# Patient Record
Sex: Female | Born: 1955
Health system: Southern US, Community
[De-identification: ages and names within clinical notes are randomized; demographics above are authoritative.]

## PROBLEM LIST (undated history)

## (undated) DIAGNOSIS — F3181 Bipolar II disorder: Secondary | ICD-10-CM

## (undated) DIAGNOSIS — M51369 Other intervertebral disc degeneration, lumbar region without mention of lumbar back pain or lower extremity pain: Secondary | ICD-10-CM

## (undated) DIAGNOSIS — N393 Stress incontinence (female) (male): Secondary | ICD-10-CM

## (undated) DIAGNOSIS — E7800 Pure hypercholesterolemia, unspecified: Secondary | ICD-10-CM

## (undated) DIAGNOSIS — K469 Unspecified abdominal hernia without obstruction or gangrene: Secondary | ICD-10-CM

## (undated) DIAGNOSIS — IMO0002 Reserved for concepts with insufficient information to code with codable children: Secondary | ICD-10-CM

## (undated) DIAGNOSIS — M5136 Other intervertebral disc degeneration, lumbar region: Secondary | ICD-10-CM

## (undated) DIAGNOSIS — E785 Hyperlipidemia, unspecified: Secondary | ICD-10-CM

## (undated) DIAGNOSIS — E669 Obesity, unspecified: Secondary | ICD-10-CM

## (undated) DIAGNOSIS — G4730 Sleep apnea, unspecified: Secondary | ICD-10-CM

## (undated) DIAGNOSIS — R7301 Impaired fasting glucose: Secondary | ICD-10-CM

## (undated) DIAGNOSIS — I10 Essential (primary) hypertension: Secondary | ICD-10-CM

## (undated) HISTORY — PX: CHOLECYSTECTOMY: SHX55

## (undated) HISTORY — DX: Other intervertebral disc degeneration, lumbar region: M51.36

## (undated) HISTORY — DX: Impaired fasting glucose: R73.01

## (undated) HISTORY — DX: Obesity, unspecified: E66.9

## (undated) HISTORY — DX: Unspecified abdominal hernia without obstruction or gangrene: K46.9

## (undated) HISTORY — DX: Other intervertebral disc degeneration, lumbar region without mention of lumbar back pain or lower extremity pain: M51.369

## (undated) HISTORY — DX: Hyperlipidemia, unspecified: E78.5

## (undated) HISTORY — DX: Essential (primary) hypertension: I10

## (undated) HISTORY — DX: Stress incontinence (female) (male): N39.3

## (undated) HISTORY — PX: ABDOMINAL HYSTERECTOMY: SHX81

## (undated) HISTORY — DX: Bipolar II disorder: F31.81

## (undated) HISTORY — PX: BLADDER REPAIR: SHX76

## (undated) HISTORY — PX: THYROIDECTOMY: SHX17

---

## 1999-07-24 HISTORY — PX: TOTAL ABDOMINAL HYSTERECTOMY W/ BILATERAL SALPINGOOPHORECTOMY: SHX83

## 2007-07-08 ENCOUNTER — Ambulatory Visit: Payer: Self-pay | Admitting: Family Medicine

## 2007-07-08 DIAGNOSIS — I10 Essential (primary) hypertension: Secondary | ICD-10-CM | POA: Insufficient documentation

## 2007-07-08 DIAGNOSIS — M5137 Other intervertebral disc degeneration, lumbosacral region: Secondary | ICD-10-CM | POA: Insufficient documentation

## 2007-07-08 DIAGNOSIS — F319 Bipolar disorder, unspecified: Secondary | ICD-10-CM | POA: Insufficient documentation

## 2007-07-08 DIAGNOSIS — E7800 Pure hypercholesterolemia, unspecified: Secondary | ICD-10-CM | POA: Insufficient documentation

## 2007-07-08 DIAGNOSIS — N393 Stress incontinence (female) (male): Secondary | ICD-10-CM | POA: Insufficient documentation

## 2007-07-21 ENCOUNTER — Ambulatory Visit: Payer: Self-pay | Admitting: Family Medicine

## 2007-07-30 ENCOUNTER — Ambulatory Visit: Payer: Self-pay | Admitting: Obstetrics & Gynecology

## 2007-08-08 ENCOUNTER — Ambulatory Visit: Payer: Self-pay | Admitting: Family Medicine

## 2007-08-14 ENCOUNTER — Encounter: Admission: RE | Admit: 2007-08-14 | Discharge: 2007-10-01 | Payer: Self-pay | Admitting: Family Medicine

## 2007-08-14 ENCOUNTER — Encounter: Payer: Self-pay | Admitting: Family Medicine

## 2007-10-17 ENCOUNTER — Ambulatory Visit: Payer: Self-pay | Admitting: Family Medicine

## 2007-10-20 ENCOUNTER — Encounter: Payer: Self-pay | Admitting: Family Medicine

## 2007-10-28 ENCOUNTER — Telehealth (INDEPENDENT_AMBULATORY_CARE_PROVIDER_SITE_OTHER): Payer: Self-pay | Admitting: *Deleted

## 2007-10-29 ENCOUNTER — Ambulatory Visit (HOSPITAL_COMMUNITY): Payer: Self-pay | Admitting: Psychiatry

## 2007-11-04 ENCOUNTER — Ambulatory Visit (HOSPITAL_COMMUNITY): Payer: Self-pay | Admitting: Psychiatry

## 2007-11-12 ENCOUNTER — Ambulatory Visit (HOSPITAL_COMMUNITY): Payer: Self-pay | Admitting: Psychology

## 2007-11-12 ENCOUNTER — Encounter: Payer: Self-pay | Admitting: Family Medicine

## 2007-11-12 LAB — CONVERTED CEMR LAB
ALT: 26 units/L (ref 0–35)
Albumin: 4.6 g/dL (ref 3.5–5.2)
Calcium: 9.6 mg/dL (ref 8.4–10.5)
Cholesterol: 121 mg/dL (ref 0–200)
Glucose, Bld: 118 mg/dL — ABNORMAL HIGH (ref 70–99)
Potassium: 4.3 meq/L (ref 3.5–5.3)
Total Bilirubin: 0.6 mg/dL (ref 0.3–1.2)
Total Protein: 7.2 g/dL (ref 6.0–8.3)
Triglycerides: 134 mg/dL (ref <150)

## 2007-11-13 ENCOUNTER — Encounter: Payer: Self-pay | Admitting: Family Medicine

## 2007-11-20 ENCOUNTER — Ambulatory Visit (HOSPITAL_COMMUNITY): Payer: Self-pay | Admitting: Psychology

## 2007-11-28 ENCOUNTER — Telehealth: Payer: Self-pay | Admitting: Family Medicine

## 2007-12-02 ENCOUNTER — Ambulatory Visit (HOSPITAL_COMMUNITY): Payer: Self-pay | Admitting: Psychiatry

## 2007-12-10 ENCOUNTER — Ambulatory Visit (HOSPITAL_COMMUNITY): Payer: Self-pay | Admitting: Psychology

## 2007-12-17 ENCOUNTER — Ambulatory Visit (HOSPITAL_COMMUNITY): Payer: Self-pay | Admitting: Psychiatry

## 2007-12-24 ENCOUNTER — Ambulatory Visit (HOSPITAL_COMMUNITY): Payer: Self-pay | Admitting: Psychology

## 2007-12-31 ENCOUNTER — Ambulatory Visit (HOSPITAL_COMMUNITY): Payer: Self-pay | Admitting: Psychology

## 2008-01-02 ENCOUNTER — Ambulatory Visit: Payer: Self-pay | Admitting: Family Medicine

## 2008-01-02 DIAGNOSIS — J209 Acute bronchitis, unspecified: Secondary | ICD-10-CM | POA: Insufficient documentation

## 2008-01-07 ENCOUNTER — Ambulatory Visit (HOSPITAL_COMMUNITY): Payer: Self-pay | Admitting: Psychology

## 2008-01-12 ENCOUNTER — Ambulatory Visit (HOSPITAL_COMMUNITY): Payer: Self-pay | Admitting: Psychiatry

## 2008-01-14 ENCOUNTER — Ambulatory Visit (HOSPITAL_COMMUNITY): Payer: Self-pay | Admitting: Psychology

## 2008-01-20 ENCOUNTER — Ambulatory Visit (HOSPITAL_COMMUNITY): Payer: Self-pay | Admitting: Psychology

## 2008-01-23 ENCOUNTER — Ambulatory Visit: Payer: Self-pay | Admitting: Family Medicine

## 2008-01-29 ENCOUNTER — Ambulatory Visit (HOSPITAL_COMMUNITY): Payer: Self-pay | Admitting: Psychology

## 2008-02-05 ENCOUNTER — Ambulatory Visit (HOSPITAL_COMMUNITY): Payer: Self-pay | Admitting: Psychology

## 2008-02-17 ENCOUNTER — Ambulatory Visit (HOSPITAL_COMMUNITY): Payer: Self-pay | Admitting: Psychology

## 2008-02-24 ENCOUNTER — Ambulatory Visit (HOSPITAL_COMMUNITY): Payer: Self-pay | Admitting: Psychology

## 2008-02-26 ENCOUNTER — Encounter: Admission: RE | Admit: 2008-02-26 | Discharge: 2008-04-13 | Payer: Self-pay | Admitting: Family Medicine

## 2008-03-02 ENCOUNTER — Ambulatory Visit (HOSPITAL_COMMUNITY): Payer: Self-pay | Admitting: Psychology

## 2008-03-10 ENCOUNTER — Ambulatory Visit (HOSPITAL_COMMUNITY): Payer: Self-pay | Admitting: Psychiatry

## 2008-03-23 ENCOUNTER — Ambulatory Visit (HOSPITAL_COMMUNITY): Payer: Self-pay | Admitting: Psychology

## 2008-04-01 ENCOUNTER — Ambulatory Visit: Payer: Self-pay | Admitting: Family Medicine

## 2008-04-01 ENCOUNTER — Encounter: Admission: RE | Admit: 2008-04-01 | Discharge: 2008-04-01 | Payer: Self-pay | Admitting: Family Medicine

## 2008-04-05 ENCOUNTER — Encounter: Admission: RE | Admit: 2008-04-05 | Discharge: 2008-04-05 | Payer: Self-pay | Admitting: Family Medicine

## 2008-04-06 ENCOUNTER — Ambulatory Visit (HOSPITAL_COMMUNITY): Payer: Self-pay | Admitting: Psychology

## 2008-04-08 ENCOUNTER — Telehealth: Payer: Self-pay | Admitting: Family Medicine

## 2008-04-12 ENCOUNTER — Telehealth: Payer: Self-pay | Admitting: Family Medicine

## 2008-05-04 ENCOUNTER — Ambulatory Visit (HOSPITAL_COMMUNITY): Payer: Self-pay | Admitting: Psychology

## 2008-05-06 ENCOUNTER — Ambulatory Visit (HOSPITAL_COMMUNITY): Payer: Self-pay | Admitting: Psychiatry

## 2008-05-07 ENCOUNTER — Encounter: Payer: Self-pay | Admitting: Family Medicine

## 2008-05-11 ENCOUNTER — Telehealth: Payer: Self-pay | Admitting: Family Medicine

## 2008-05-25 ENCOUNTER — Ambulatory Visit (HOSPITAL_COMMUNITY): Payer: Self-pay | Admitting: Psychology

## 2008-06-07 ENCOUNTER — Ambulatory Visit: Payer: Self-pay | Admitting: Family Medicine

## 2008-06-07 DIAGNOSIS — K589 Irritable bowel syndrome without diarrhea: Secondary | ICD-10-CM | POA: Insufficient documentation

## 2008-06-07 DIAGNOSIS — L708 Other acne: Secondary | ICD-10-CM | POA: Insufficient documentation

## 2008-06-22 ENCOUNTER — Encounter: Admission: RE | Admit: 2008-06-22 | Discharge: 2008-06-22 | Payer: Self-pay | Admitting: Family Medicine

## 2008-06-22 ENCOUNTER — Ambulatory Visit: Payer: Self-pay | Admitting: Family Medicine

## 2008-06-22 DIAGNOSIS — R071 Chest pain on breathing: Secondary | ICD-10-CM | POA: Insufficient documentation

## 2008-06-28 ENCOUNTER — Telehealth: Payer: Self-pay | Admitting: Family Medicine

## 2008-06-29 ENCOUNTER — Ambulatory Visit (HOSPITAL_COMMUNITY): Payer: Self-pay | Admitting: Psychiatry

## 2008-06-30 ENCOUNTER — Ambulatory Visit (HOSPITAL_COMMUNITY): Payer: Self-pay | Admitting: Psychology

## 2008-08-12 ENCOUNTER — Encounter: Payer: Self-pay | Admitting: Family Medicine

## 2008-08-16 ENCOUNTER — Ambulatory Visit (HOSPITAL_COMMUNITY): Payer: Self-pay | Admitting: Psychology

## 2008-08-18 ENCOUNTER — Ambulatory Visit: Payer: Self-pay | Admitting: Family Medicine

## 2008-08-18 DIAGNOSIS — F4321 Adjustment disorder with depressed mood: Secondary | ICD-10-CM | POA: Insufficient documentation

## 2008-08-18 DIAGNOSIS — J45901 Unspecified asthma with (acute) exacerbation: Secondary | ICD-10-CM | POA: Insufficient documentation

## 2008-09-02 ENCOUNTER — Ambulatory Visit: Payer: Self-pay | Admitting: Family Medicine

## 2008-09-02 DIAGNOSIS — S139XXA Sprain of joints and ligaments of unspecified parts of neck, initial encounter: Secondary | ICD-10-CM | POA: Insufficient documentation

## 2008-09-03 ENCOUNTER — Ambulatory Visit (HOSPITAL_COMMUNITY): Payer: Self-pay | Admitting: Psychology

## 2008-09-06 ENCOUNTER — Telehealth (INDEPENDENT_AMBULATORY_CARE_PROVIDER_SITE_OTHER): Payer: Self-pay | Admitting: *Deleted

## 2008-09-07 ENCOUNTER — Ambulatory Visit: Payer: Self-pay | Admitting: Family Medicine

## 2008-09-07 DIAGNOSIS — R062 Wheezing: Secondary | ICD-10-CM | POA: Insufficient documentation

## 2008-09-28 ENCOUNTER — Ambulatory Visit (HOSPITAL_COMMUNITY): Payer: Self-pay | Admitting: Psychiatry

## 2008-10-11 ENCOUNTER — Ambulatory Visit (HOSPITAL_COMMUNITY): Payer: Self-pay | Admitting: Psychology

## 2008-12-21 ENCOUNTER — Ambulatory Visit (HOSPITAL_COMMUNITY): Payer: Self-pay | Admitting: Psychiatry

## 2008-12-22 ENCOUNTER — Encounter: Admission: RE | Admit: 2008-12-22 | Discharge: 2008-12-22 | Payer: Self-pay | Admitting: Family Medicine

## 2008-12-22 ENCOUNTER — Ambulatory Visit: Payer: Self-pay | Admitting: Family Medicine

## 2008-12-22 DIAGNOSIS — M79609 Pain in unspecified limb: Secondary | ICD-10-CM | POA: Insufficient documentation

## 2008-12-23 DIAGNOSIS — S92919A Unspecified fracture of unspecified toe(s), initial encounter for closed fracture: Secondary | ICD-10-CM | POA: Insufficient documentation

## 2008-12-27 ENCOUNTER — Ambulatory Visit (HOSPITAL_COMMUNITY): Payer: Self-pay | Admitting: Psychology

## 2009-01-05 ENCOUNTER — Ambulatory Visit: Payer: Self-pay | Admitting: Family Medicine

## 2009-01-07 ENCOUNTER — Ambulatory Visit (HOSPITAL_COMMUNITY): Payer: Self-pay | Admitting: Psychology

## 2009-01-20 ENCOUNTER — Telehealth: Payer: Self-pay | Admitting: Family Medicine

## 2009-01-27 ENCOUNTER — Encounter: Payer: Self-pay | Admitting: Family Medicine

## 2009-01-28 LAB — CONVERTED CEMR LAB
Alkaline Phosphatase: 96 units/L (ref 39–117)
BUN: 15 mg/dL (ref 6–23)
CO2: 23 meq/L (ref 19–32)
Cholesterol: 140 mg/dL (ref 0–200)
Creatinine, Ser: 0.87 mg/dL (ref 0.40–1.20)
Glucose, Bld: 102 mg/dL — ABNORMAL HIGH (ref 70–99)
HCT: 44.2 % (ref 36.0–46.0)
HDL: 53 mg/dL (ref >39)
Hemoglobin: 14.5 g/dL (ref 12.0–15.0)
MCHC: 32.8 g/dL (ref 30.0–36.0)
MCV: 87.9 fL (ref 78.0–100.0)
RBC: 5.03 M/uL (ref 3.87–5.11)
Sodium: 142 meq/L (ref 135–145)
Total Bilirubin: 0.4 mg/dL (ref 0.3–1.2)
Total Protein: 7.3 g/dL (ref 6.0–8.3)
Triglycerides: 112 mg/dL (ref <150)
VLDL: 22 mg/dL (ref 0–40)
WBC: 8.4 10*3/uL (ref 4.0–10.5)

## 2009-02-03 ENCOUNTER — Ambulatory Visit (HOSPITAL_COMMUNITY): Payer: Self-pay | Admitting: Psychology

## 2009-02-24 ENCOUNTER — Ambulatory Visit (HOSPITAL_COMMUNITY): Payer: Self-pay | Admitting: Psychology

## 2009-03-17 ENCOUNTER — Ambulatory Visit (HOSPITAL_COMMUNITY): Payer: Self-pay | Admitting: Psychiatry

## 2009-03-22 ENCOUNTER — Ambulatory Visit (HOSPITAL_COMMUNITY): Payer: Self-pay | Admitting: Psychology

## 2009-04-12 ENCOUNTER — Encounter: Admission: RE | Admit: 2009-04-12 | Discharge: 2009-04-12 | Payer: Self-pay | Admitting: Family Medicine

## 2009-04-12 LAB — HM MAMMOGRAPHY: HM Mammogram: NEGATIVE

## 2009-04-26 ENCOUNTER — Ambulatory Visit: Payer: Self-pay | Admitting: Family Medicine

## 2009-05-05 ENCOUNTER — Ambulatory Visit (HOSPITAL_COMMUNITY): Payer: Self-pay | Admitting: Psychology

## 2009-05-16 ENCOUNTER — Ambulatory Visit: Payer: Self-pay | Admitting: Occupational Medicine

## 2009-06-14 ENCOUNTER — Ambulatory Visit (HOSPITAL_COMMUNITY): Payer: Self-pay | Admitting: Psychiatry

## 2009-06-28 ENCOUNTER — Ambulatory Visit (HOSPITAL_COMMUNITY): Payer: Self-pay | Admitting: Psychology

## 2009-07-08 ENCOUNTER — Telehealth: Payer: Self-pay | Admitting: Family Medicine

## 2009-07-08 DIAGNOSIS — S62639A Displaced fracture of distal phalanx of unspecified finger, initial encounter for closed fracture: Secondary | ICD-10-CM | POA: Insufficient documentation

## 2009-07-08 DIAGNOSIS — E049 Nontoxic goiter, unspecified: Secondary | ICD-10-CM | POA: Insufficient documentation

## 2009-07-12 ENCOUNTER — Encounter: Payer: Self-pay | Admitting: Family Medicine

## 2009-07-14 ENCOUNTER — Encounter: Admission: RE | Admit: 2009-07-14 | Discharge: 2009-07-14 | Payer: Self-pay | Admitting: Family Medicine

## 2009-07-21 ENCOUNTER — Encounter: Payer: Self-pay | Admitting: Family Medicine

## 2009-07-25 ENCOUNTER — Ambulatory Visit (HOSPITAL_COMMUNITY): Payer: Self-pay | Admitting: Psychiatry

## 2009-07-27 ENCOUNTER — Ambulatory Visit (HOSPITAL_COMMUNITY): Payer: Self-pay | Admitting: Psychology

## 2009-08-02 ENCOUNTER — Encounter: Payer: Self-pay | Admitting: Family Medicine

## 2009-08-11 ENCOUNTER — Encounter: Payer: Self-pay | Admitting: Family Medicine

## 2009-08-17 ENCOUNTER — Ambulatory Visit (HOSPITAL_COMMUNITY): Payer: Self-pay | Admitting: Psychology

## 2009-08-31 ENCOUNTER — Ambulatory Visit (HOSPITAL_COMMUNITY): Payer: Self-pay | Admitting: Psychology

## 2009-09-16 ENCOUNTER — Encounter: Payer: Self-pay | Admitting: Family Medicine

## 2009-09-23 ENCOUNTER — Ambulatory Visit (HOSPITAL_COMMUNITY): Payer: Self-pay | Admitting: Psychology

## 2009-10-06 ENCOUNTER — Ambulatory Visit (HOSPITAL_COMMUNITY): Payer: Self-pay | Admitting: Psychology

## 2009-11-04 ENCOUNTER — Ambulatory Visit: Payer: Self-pay | Admitting: Family Medicine

## 2009-11-04 DIAGNOSIS — M542 Cervicalgia: Secondary | ICD-10-CM | POA: Insufficient documentation

## 2009-11-04 DIAGNOSIS — IMO0002 Reserved for concepts with insufficient information to code with codable children: Secondary | ICD-10-CM | POA: Insufficient documentation

## 2009-11-07 ENCOUNTER — Ambulatory Visit (HOSPITAL_COMMUNITY): Payer: Self-pay | Admitting: Psychiatry

## 2009-11-09 ENCOUNTER — Ambulatory Visit: Payer: Self-pay | Admitting: Family Medicine

## 2009-11-09 DIAGNOSIS — L0291 Cutaneous abscess, unspecified: Secondary | ICD-10-CM | POA: Insufficient documentation

## 2009-11-09 DIAGNOSIS — L0390 Cellulitis, unspecified: Secondary | ICD-10-CM

## 2009-11-30 ENCOUNTER — Encounter: Payer: Self-pay | Admitting: Family Medicine

## 2010-02-03 ENCOUNTER — Ambulatory Visit (HOSPITAL_COMMUNITY): Payer: Self-pay | Admitting: Psychiatry

## 2010-03-09 ENCOUNTER — Telehealth (INDEPENDENT_AMBULATORY_CARE_PROVIDER_SITE_OTHER): Payer: Self-pay | Admitting: *Deleted

## 2010-03-22 ENCOUNTER — Ambulatory Visit: Payer: Self-pay | Admitting: Family Medicine

## 2010-03-22 DIAGNOSIS — J309 Allergic rhinitis, unspecified: Secondary | ICD-10-CM | POA: Insufficient documentation

## 2010-03-22 DIAGNOSIS — S838X9A Sprain of other specified parts of unspecified knee, initial encounter: Secondary | ICD-10-CM | POA: Insufficient documentation

## 2010-03-22 DIAGNOSIS — S86819A Strain of other muscle(s) and tendon(s) at lower leg level, unspecified leg, initial encounter: Secondary | ICD-10-CM | POA: Insufficient documentation

## 2010-04-21 ENCOUNTER — Telehealth: Payer: Self-pay | Admitting: Family Medicine

## 2010-05-10 ENCOUNTER — Ambulatory Visit (HOSPITAL_COMMUNITY): Payer: Self-pay | Admitting: Psychiatry

## 2010-05-31 ENCOUNTER — Ambulatory Visit: Payer: Self-pay | Admitting: Family Medicine

## 2010-05-31 DIAGNOSIS — J45909 Unspecified asthma, uncomplicated: Secondary | ICD-10-CM | POA: Insufficient documentation

## 2010-08-02 ENCOUNTER — Ambulatory Visit (HOSPITAL_COMMUNITY)
Admission: RE | Admit: 2010-08-02 | Discharge: 2010-08-02 | Payer: Self-pay | Source: Home / Self Care | Attending: Psychiatry | Admitting: Psychiatry

## 2010-08-06 ENCOUNTER — Ambulatory Visit
Admission: RE | Admit: 2010-08-06 | Discharge: 2010-08-06 | Payer: Self-pay | Source: Home / Self Care | Admitting: Emergency Medicine

## 2010-08-12 ENCOUNTER — Encounter: Payer: Self-pay | Admitting: Family Medicine

## 2010-08-13 ENCOUNTER — Encounter: Payer: Self-pay | Admitting: Family Medicine

## 2010-08-18 ENCOUNTER — Ambulatory Visit
Admission: RE | Admit: 2010-08-18 | Discharge: 2010-08-18 | Payer: Self-pay | Source: Home / Self Care | Attending: Family Medicine | Admitting: Family Medicine

## 2010-08-18 ENCOUNTER — Encounter
Admission: RE | Admit: 2010-08-18 | Discharge: 2010-08-18 | Payer: Self-pay | Source: Home / Self Care | Attending: Family Medicine | Admitting: Family Medicine

## 2010-08-18 ENCOUNTER — Telehealth: Payer: Self-pay | Admitting: Family Medicine

## 2010-08-18 DIAGNOSIS — R42 Dizziness and giddiness: Secondary | ICD-10-CM | POA: Insufficient documentation

## 2010-08-18 DIAGNOSIS — S0003XA Contusion of scalp, initial encounter: Secondary | ICD-10-CM | POA: Insufficient documentation

## 2010-08-18 DIAGNOSIS — S1093XA Contusion of unspecified part of neck, initial encounter: Secondary | ICD-10-CM

## 2010-08-18 DIAGNOSIS — S0083XA Contusion of other part of head, initial encounter: Secondary | ICD-10-CM
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
## 2010-08-22 NOTE — Assessment & Plan Note (Signed)
Summary: bronchitis/ allergies   Vital Signs:  Patient profile:   55 year old female Height:      64.4 inches Weight:      240 pounds BMI:     40.83 O2 Sat:      97 % on Room air Temp:     98.4 degrees F oral Pulse rate:   75 / minute BP sitting:   126 / 76  (left arm) Cuff size:   large  Vitals Entered By: Payton Spark CMA (March 22, 2010 54:03 PM)  O2 Flow:  Room air CC: Productive cough x 5-6 days due to bad allergies. Also c/o L gluteal pain x months.   Primary Care Provider:  Seymour Bars DO  CC:  Productive cough x 5-6 days due to bad allergies. Also c/o L gluteal pain x months..  History of Present Illness: 55 yo WF presents for a cough that began 6 days ago, at times productive.  She has allergies and has some wheezing but no SOB or chest tightness.  She is using sudafed.  She has had post nasal drip and ear fullness but no rhinorrhea.  She has no change in her HAs.  Her cough is not keeping her up at night.    Denies fevers / chills. She is working full time, going back to school and has 4 kids to take care of.  She is going back to to school for [employer].    She is also having pain in the L side of her buttocks.     Current Medications (verified): 1)  Wellbutrin Xl 300 Mg  Tb24 (Bupropion Hcl) .... Take One Tablet By Mouth Once A Day 2)  Lamictal 200 Mg Tabs (Lamotrigine) .... Take 1 Tablet By Mouth Once A Day 3)  Simvastatin 80 Mg  Tabs (Simvastatin) .... Take One Tablet By Mouth Once A Day 4)  Hydrochlorothiazide 25 Mg Tabs (Hydrochlorothiazide) .... Take 1 Tablet By Mouth Once A Day 5)  Econazole Nitrate 1 %  Crea (Econazole Nitrate) .... Apply Daily To Affected Area. 6)  Benazepril Hcl 40 Mg Tabs (Benazepril Hcl) .... Take 1 Tablet By Mouth Once A Day 7)  Amlodipine Besylate 5 Mg Tabs (Amlodipine Besylate) .... Take 1 Tablet By Mouth Once A Day 8)  Tylenol/codeine #3 300-30 Mg Tabs (Acetaminophen-Codeine) .Marland Kitchen.. 1-2 Tabs By Mouth Two Times A Day As  Needed Severe Pain 9)  Cyclobenzaprine Hcl 5 Mg Tabs (Cyclobenzaprine Hcl) .Marland Kitchen.. 1-2 Tabs By Mouth At Bedtime As Needed Neck Pain 10)  Tens Unit .... Use As Directed 11)  Zoloft 50 Mg Tabs (Sertraline Hcl) .... Take 1 Tablet By Mouth Once A Day  Allergies (verified): 1)  ! * Monistat 2)  ! Erythromycin  Review of Systems      See HPI  Physical Exam  General:  alert, well-developed, well-nourished, and well-hydrated.  obese Head:  normocephalic and atraumatic.  sinuses NTTP Eyes:  conjunctiva clear Ears:  EACs patent; TMs translucent and gray with good cone of light and bony landmarks.  Nose:  scant rhinorrhea Mouth:  o/p injected with cobblestoning Neck:  no masses.   Lungs:  Normal respiratory effort, chest expands symmetrically. Lungs are clear to auscultation, no crackles.  dry cough, + bibasilar wheezing Heart:  Normal rate and regular rhythm. S1 and S2 normal without gallop, murmur, click, rub or other extra sounds. Msk:  normal gait and L spine ROM.  tender over insertion of L hamstring muscle onto the ischial tuberosity  Skin:  color normal.   Cervical Nodes:  shotty R>L anterior cervical LNs   Impression & Recommendations:  Problem # 1:  ACUTE BRONCHITIS (ICD-466.0) Likley secondary to acute flare of seasonal allergies vs viral. Treat with ProAir HFA 2 puffs 4 x a day x 7 wk + Prednisone 40 mg/ day x 5 days.  Use Claritin once a day for underlying allergies. Call if not improved in 1 wks. The following medications were removed from the medication list:    Keflex 500 Mg Caps (Cephalexin) .Marland Kitchen... Take 1 tablet by mouth three times a day for 10 days Her updated medication list for this problem includes:    Proair Hfa 108 (90 Base) Mcg/act Aers (Albuterol sulfate) .Marland Kitchen... 2 puffs q 4-6 hrs prn  Problem # 2:  ALLERGIC RHINITIS (ICD-477.9) Start Claritin once a day thru the change of seasons. Her updated medication list for this problem includes:    Claritin 10 Mg Tabs  (Loratadine) .Marland Kitchen... 1 tab by mouth daily  Problem # 3:  MUSCLE STRAIN, HAMSTRING MUSCLE (ICD-844.8) Several mos of hamstring pain, likely muscle strain from prolonged sitting. Will get her set up for PT. Orders: Physical Therapy Referral (PT)  Complete Medication List: 1)  Wellbutrin Xl 300 Mg Tb24 (Bupropion hcl) .... Take one tablet by mouth once a day 2)  Lamictal 200 Mg Tabs (Lamotrigine) .... Take 1 tablet by mouth once a day 3)  Simvastatin 80 Mg Tabs (Simvastatin) .... Take one tablet by mouth once a day 4)  Hydrochlorothiazide 25 Mg Tabs (Hydrochlorothiazide) .... Take 1 tablet by mouth once a day 5)  Econazole Nitrate 1 % Crea (Econazole nitrate) .... Apply daily to affected area. 6)  Benazepril Hcl 40 Mg Tabs (Benazepril hcl) .... Take 1 tablet by mouth once a day 7)  Amlodipine Besylate 5 Mg Tabs (Amlodipine besylate) .... Take 1 tablet by mouth once a day 8)  Tylenol/codeine #3 300-30 Mg Tabs (Acetaminophen-codeine) .Marland Kitchen.. 1-2 tabs by mouth two times a day as needed severe pain 9)  Cyclobenzaprine Hcl 5 Mg Tabs (Cyclobenzaprine hcl) .Marland Kitchen.. 1-2 tabs by mouth at bedtime as needed neck pain 10)  Tens Unit  .... Use as directed 11)  Zoloft 50 Mg Tabs (Sertraline hcl) .... Take 1 tablet by mouth once a day 12)  Prednisone 20 Mg Tabs (Prednisone) .... 2 tabs by mouth qam x 5 days 13)  Proair Hfa 108 (90 Base) Mcg/act Aers (Albuterol sulfate) .... 2 puffs q 4-6 hrs prn 14)  Claritin 10 Mg Tabs (Loratadine) .Marland Kitchen.. 1 tab by mouth daily  Patient Instructions: 1)  For allergic bronchitis: 2)  Take 5 days of Prednisone 40 mg/ day. 3)  Use Inhaler 2 puffs 4 x a day for the next wk. 4)  Start on Claritin once a day for seasonal allergies.  Take daily x 6 wks to prevent recurrence. 5)  Will get you set up for PT down the hall for hamstring strain. Prescriptions: PROAIR HFA 108 (90 BASE) MCG/ACT AERS (ALBUTEROL SULFATE) 2 puffs q 4-6 hrs prn  #1 x 0   Entered and Authorized by:   Seymour Bars  DO   Signed by:   Seymour Bars DO on 03/22/2010   Method used:   Electronically to        [facility]  [id number] (retail)       [address]       [place], [place]  [address]       Ph: [phone]  Fax: [phone]   RxID:   [id number] PREDNISONE 20 MG TABS (PREDNISONE) 2 tabs by mouth qAM x 5 days  #10 x 0   Entered and Authorized by:   Seymour Bars DO   Signed by:   Seymour Bars DO on 03/22/2010   Method used:   Electronically to        [facility]  [id number] (retail)       [address]       [place], [place]  [address]       Ph: [phone]       Fax: [phone]   RxID:   [id number]

## 2010-08-22 NOTE — Op Note (Signed)
Summary: Thyroid Lobectomy/[facility]  Thyroid Lobectomy/[facility]   Imported By: Lanelle Bal 08/24/2009 08:11:24  _____________________________________________________________________  External Attachment:    Type:   Image     Comment:   External Document

## 2010-08-22 NOTE — Letter (Signed)
Summary: [facility]   Imported By: Lanelle Bal 09/27/2009 13:22:59  _____________________________________________________________________  External Attachment:    Type:   Image     Comment:   External Document

## 2010-08-22 NOTE — Letter (Signed)
Summary: [facility]   Imported By: Lanelle Bal 12/08/2009 09:02:00  _____________________________________________________________________  External Attachment:    Type:   Image     Comment:   External Document

## 2010-08-22 NOTE — Assessment & Plan Note (Signed)
Summary: splinters in right arm   Vital Signs:  Patient profile:   55 year old female Height:      64.4 inches Weight:      239 pounds Pulse rate:   94 / minute BP sitting:   147 / 81  (left arm) Cuff size:   large  Vitals Entered By: Kathlene November (November 09, 2009 2:34 PM) CC: got splinters in right lower arm- happened on Monday night   Primary Care Provider:  Seymour Bars DO  CC:  got splinters in right lower arm- happened on Monday night.  History of Present Illness: got splinters in right lower arm- happened on Monday night. door swing back on her arm adn got several splinter. Tried ot remove them with an x-acto knife.  Gets easily infected.  Last Td was in 2006.  No drainage.    Current Medications (verified): 1)  Wellbutrin Xl 300 Mg  Tb24 (Bupropion Hcl) .... Take One Tablet By Mouth Once A Day 2)  Lamictal 200 Mg Tabs (Lamotrigine) .... Take 1 Tablet By Mouth Once A Day 3)  Simvastatin 80 Mg  Tabs (Simvastatin) .... Take One Tablet By Mouth Once A Day 4)  Hydrochlorothiazide 25 Mg Tabs (Hydrochlorothiazide) .... Take 1 Tablet By Mouth Once A Day 5)  Econazole Nitrate 1 %  Crea (Econazole Nitrate) .... Apply Daily To Affected Area. 6)  Benazepril Hcl 40 Mg Tabs (Benazepril Hcl) .... Take 1 Tablet By Mouth Once A Day 7)  Amlodipine Besylate 5 Mg Tabs (Amlodipine Besylate) .... Take 1 Tablet By Mouth Once A Day 8)  Tylenol/codeine #3 300-30 Mg Tabs (Acetaminophen-Codeine) .Marland Kitchen.. 1-2 Tabs By Mouth Two Times A Day As Needed Severe Pain 9)  Cyclobenzaprine Hcl 5 Mg Tabs (Cyclobenzaprine Hcl) .Marland Kitchen.. 1-2 Tabs By Mouth At Bedtime As Needed Neck Pain 10)  Tens Unit .... Use As Directed 11)  Zoloft 50 Mg Tabs (Sertraline Hcl) .... Take 1 Tablet By Mouth Once A Day 12)  Piroxicam 20 Mg Caps (Piroxicam) .Marland Kitchen.. 1 Capsule By Mouth Daily With Food As Needed For Pain  Allergies (verified): 1)  ! * Monistat 2)  ! Erythromycin  Comments:  Nurse/Medical Assistant: The patient's medications and  allergies were reviewed with the patient and were updated in the Medication and Allergy Lists. Kathlene November (November 09, 2009 2:35 PM)  Physical Exam  Skin:  Right forearm with multiple laceration with some surrounding erythema.  No active drainage or lesions.    Impression & Recommendations:  Problem # 1:  CELLULITIS (ICD-682.9) Some early cellulitis. Will tx wiht keflex. Call if not improving.  Td is up to date.  Her updated medication list for this problem includes:    Keflex 500 Mg Caps (Cephalexin) .Marland Kitchen... Take 1 tablet by mouth three times a day for 10 days  Complete Medication List: 1)  Wellbutrin Xl 300 Mg Tb24 (Bupropion hcl) .... Take one tablet by mouth once a day 2)  Lamictal 200 Mg Tabs (Lamotrigine) .... Take 1 tablet by mouth once a day 3)  Simvastatin 80 Mg Tabs (Simvastatin) .... Take one tablet by mouth once a day 4)  Hydrochlorothiazide 25 Mg Tabs (Hydrochlorothiazide) .... Take 1 tablet by mouth once a day 5)  Econazole Nitrate 1 % Crea (Econazole nitrate) .... Apply daily to affected area. 6)  Benazepril Hcl 40 Mg Tabs (Benazepril hcl) .... Take 1 tablet by mouth once a day 7)  Amlodipine Besylate 5 Mg Tabs (Amlodipine besylate) .... Take 1 tablet by mouth  once a day 8)  Tylenol/codeine #3 300-30 Mg Tabs (Acetaminophen-codeine) .Marland Kitchen.. 1-2 tabs by mouth two times a day as needed severe pain 9)  Cyclobenzaprine Hcl 5 Mg Tabs (Cyclobenzaprine hcl) .Marland Kitchen.. 1-2 tabs by mouth at bedtime as needed neck pain 10)  Tens Unit  .... Use as directed 11)  Zoloft 50 Mg Tabs (Sertraline hcl) .... Take 1 tablet by mouth once a day 12)  Piroxicam 20 Mg Caps (Piroxicam) .Marland Kitchen.. 1 capsule by mouth daily with food as needed for pain 13)  Keflex 500 Mg Caps (Cephalexin) .... Take 1 tablet by mouth three times a day for 10 days Prescriptions: KEFLEX 500 MG CAPS (CEPHALEXIN) Take 1 tablet by mouth three times a day for 10 days  #30 x 0   Entered and Authorized by:   Nani Gasser MD   Signed by:    Nani Gasser MD on 11/09/2009   Method used:   Electronically to        [facility]  [id number] (retail)       [address]       [place], [place]  [address]       Ph: [phone]       Fax: [phone]   RxID:   [id number]

## 2010-08-22 NOTE — Letter (Signed)
Summary: [facility]  [facility]   Imported By: Lanelle Bal 08/29/2009 10:19:52  _____________________________________________________________________  External Attachment:    Type:   Image     Comment:   External Document

## 2010-08-22 NOTE — Op Note (Signed)
Summary: Thyroid Lobectomy /[facility]  Thyroid Lobectomy [facility]   Imported By: Lanelle Bal 09/07/2009 10:18:55  _____________________________________________________________________  External Attachment:    Type:   Image     Comment:   External Document

## 2010-08-22 NOTE — Assessment & Plan Note (Signed)
Summary: allergic bronchitis   Vital Signs:  Patient profile:   55 year old female Height:      64.4 inches Weight:      243 pounds BMI:     41.34 O2 Sat:      98 % on Room air Temp:     97.9 degrees F oral Pulse rate:   69 / minute BP sitting:   145 / 63  (left arm) Cuff size:   large  Vitals Entered By: Payton Spark CMA (May 31, 2010 1:24 PM)  O2 Flow:  Room air  Serial Vital Signs/Assessments:  Comments: 1:52 PM Peak [facility] [address] By: Payton Spark CMA   CC: Productive cough, hoarse and congestion x 1 week.   Primary Care Provider:  Seymour Bars DO  CC:  Productive cough and hoarse and congestion x 1 week.Marland Kitchen  History of Present Illness: 55 yo WF presents for hoarseness after spraying for fleas a week ago.  She had coughed up a pill that she swallowed wrong the same day.  That's when her sore throat and laryngitis started.  She has also had nasal congestion.  No fevers or chills.  No bodyaches.  She has a productive cough.  Her cough is a little worse at night.  She has some wheezing and off but no SOB or chest tightness.    She is taking Nyquil, allergy medicine ( Mucinex DM). She has never really had asthma but has required and inhaler with bronchitis.  Current Medications (verified): 1)  Wellbutrin Xl 300 Mg  Tb24 (Bupropion Hcl) .... Take One Tablet By Mouth Once A Day 2)  Lamictal 200 Mg Tabs (Lamotrigine) .... Take 1 Tablet By Mouth Once A Day 3)  Simvastatin 80 Mg  Tabs (Simvastatin) .... Take One Tablet By Mouth Once A Day 4)  Hydrochlorothiazide 25 Mg Tabs (Hydrochlorothiazide) .... Take 1 Tablet By Mouth Once A Day 5)  Econazole Nitrate 1 %  Crea (Econazole Nitrate) .... Apply Daily To Affected Area. 6)  Benazepril Hcl 40 Mg Tabs (Benazepril Hcl) .... Take 1 Tablet By Mouth Once A Day 7)  Amlodipine Besylate 5 Mg Tabs (Amlodipine Besylate) .... Take 1 Tablet By Mouth Once A Day 8)  Tylenol/codeine #3 300-30 Mg Tabs (Acetaminophen-Codeine) .Marland Kitchen..  1-2 Tabs By Mouth Two Times A Day As Needed Severe Pain 9)  Cyclobenzaprine Hcl 5 Mg Tabs (Cyclobenzaprine Hcl) .Marland Kitchen.. 1-2 Tabs By Mouth At Bedtime As Needed Neck Pain 10)  Tens Unit .... Use As Directed 11)  Zoloft 50 Mg Tabs (Sertraline Hcl) .... Take 1 Tablet By Mouth Once A Day  Allergies (verified): 1)  ! * Monistat 2)  ! Erythromycin  Past History:  Past Medical History: Reviewed history from 06/07/2008 and no changes required. impaired fasting glucose/ hx GDM HTN hyperlipidemia bipolar type II (pscyh hospitalization 2006) incest survivor stress incontinence obesity Lumbar DDD- uses TENS unit abdominal incisional hernia  last mammo Spring 2008 pap 2009- Dr Marice Potter  Social History: Reviewed history from 07/08/2007 and no changes required. Used to be a Engineer, civil (consulting) and run businesses, has been a Development worker, international aid. Married, been thru separation.  Divorced x 1. Has son in [place], grown and daughter, Idalia Needle at home (24) Adoped several of her foster kids: Aggie Cosier and Vernona Rieger (sisters), Gaynelle Adu. Quit smoking in 1996. 6 caffeine/ day. Joined the [employer].  Hopes to lose weight.  Review of Systems      See HPI  Physical Exam  General:  alert, well-developed, well-nourished,  and well-hydrated.  obese Head:  normocephalic and atraumatic.   Eyes:  conjunctiva clear Nose:  no nasal discharge.   Mouth:  pharynx pink and moist.  hoarse.  no injection Neck:  no masses.   Lungs:  Normal respiratory effort, chest expands symmetrically. Lungs are clear to auscultation, no crackles with end expiratory wheeze Heart:  Normal rate and regular rhythm. S1 and S2 normal without gallop, murmur, click, rub or other extra sounds. Skin:  color normal and no rashes.   Cervical Nodes:  No lymphadenopathy noted   Impression & Recommendations:  Problem # 1:  BRONCHITIS, ALLERGIC (ICD-493.90)  Will treat allergic bronchitis with use of D - Allergy chewables (samples given) 1-2 tabs by mouth qid as needed.  Once samples  complete, change to OTC Zyrtec in the evenings.  Use Xopenex HFA 2 puffs 4 x a day x 10 days.  Call if not improved in 10 days. The following medications were removed from the medication list:    Prednisone 20 Mg Tabs (Prednisone) .Marland Kitchen... 2 tabs by mouth qam x 5 days    Proair Hfa 108 (90 Base) Mcg/act Aers (Albuterol sulfate) .Marland Kitchen... 2 puffs q 4-6 hrs prn  Orders: Peak [facility] (94150)  Complete Medication List: 1)  Wellbutrin Xl 300 Mg Tb24 (Bupropion hcl) .... Take one tablet by mouth once a day 2)  Lamictal 200 Mg Tabs (Lamotrigine) .... Take 1 tablet by mouth once a day 3)  Simvastatin 80 Mg Tabs (Simvastatin) .... Take one tablet by mouth once a day 4)  Hydrochlorothiazide 25 Mg Tabs (Hydrochlorothiazide) .... Take 1 tablet by mouth once a day 5)  Econazole Nitrate 1 % Crea (Econazole nitrate) .... Apply daily to affected area. 6)  Benazepril Hcl 40 Mg Tabs (Benazepril hcl) .... Take 1 tablet by mouth once a day 7)  Amlodipine Besylate 5 Mg Tabs (Amlodipine besylate) .... Take 1 tablet by mouth once a day 8)  Tylenol/codeine #3 300-30 Mg Tabs (Acetaminophen-codeine) .Marland Kitchen.. 1-2 tabs by mouth two times a day as needed severe pain 9)  Cyclobenzaprine Hcl 5 Mg Tabs (Cyclobenzaprine hcl) .Marland Kitchen.. 1-2 tabs by mouth at bedtime as needed neck pain 10)  Tens Unit  .... Use as directed 11)  Zoloft 50 Mg Tabs (Sertraline hcl) .... Take 1 tablet by mouth once a day  Patient Instructions: 1)  Use D - Allergy tabs 1-2 chewable tabs every 6 hrs as needed for cough and congestion.  Once complete, change to OTC Zyrtec in the evenings if allergy symptoms continue. 2)  Use Xopenex inhaler 2 puffs 4 x a day for the next 10 days for wheezing. 3)  If not resolved in 10 days, please call.   Orders Added: 1)  Est. Patient Level III [26948] 2)  Peak Flow Rate [94150]

## 2010-08-22 NOTE — Progress Notes (Signed)
Summary: Wants pain med refilled  Phone Note Call from Patient Call back at Home Phone [phone]   Caller: Patient Call For: Seymour Bars DO Summary of Call: Pt calls and LM that she fell and hurts in her back and wants a refill on the Tylenol #3 Initial call taken by: Kathlene November,  March 09, 2010 2:48 PM  Follow-up for Phone Call        Since this is a narcotic and not routinely on her med list, will need OV. Follow-up by: Seymour Bars DO,  March 10, 2010 8:06 AM  Additional Follow-up for Phone Call Additional follow up Details #1::        [facility] informing Pt of the above Additional Follow-up by: Payton Spark CMA,  March 10, 2010 8:36 AM     Appended Document: Wants pain med refilled Prescriptions: TYLENOL/CODEINE #3 300-30 MG TABS (ACETAMINOPHEN-CODEINE) 1-2 tabs by mouth two times a day as needed severe pain  #24 x 0   Entered and Authorized by:   Seymour Bars DO   Signed by:   Seymour Bars DO on 03/10/2010   Method used:   Printed then faxed to ...       [facility]  [address] [phone]* (retail)       [address]       [place], [place]  [address]       Ph: [phone]       Fax: [phone]   RxID:   [id number]  Will fill #24.  If needing more for her injury, will need an OV.  I did see 2 prior fills for this medicine, the last one in June 2010.  Seymour Bars, D.O.  Appended Document: Wants pain med refilled Pt aware of the above

## 2010-08-22 NOTE — Consult Note (Signed)
Summary: [facility]   Imported By: Lanelle Bal 07/29/2009 09:56:23  _____________________________________________________________________  External Attachment:    Type:   Image     Comment:   External Document

## 2010-08-22 NOTE — Letter (Signed)
Summary: [facility]   Imported By: Lanelle Bal 09/02/2009 13:00:01  _____________________________________________________________________  External Attachment:    Type:   Image     Comment:   External Document

## 2010-08-22 NOTE — Progress Notes (Signed)
Summary: Wants diflucan  Phone Note Call from Patient Call back at Home Phone [phone]   Caller: Patient Call For: Sharon Bars DO Summary of Call: Pt calls and wants to know if you would call in Diflucan for her- she has a yeast infection and states she is allergic to Monistat and OTC meds. Leaving to go out of town today at 2 for a wedding Initial call taken by: Kathlene November LPN,  April 21, 2010 9:12 AM    New/Updated Medications: DIFLUCAN 150 MG TABS (FLUCONAZOLE) 1 tab by mouth x 1 Prescriptions: DIFLUCAN 150 MG TABS (FLUCONAZOLE) 1 tab by mouth x 1  #1 tab x 0   Entered and Authorized by:   Sharon Bars DO   Signed by:   Sharon Bars DO on 04/21/2010   Method used:   Electronically to        [facility]  [id number] (retail)       [address]       [place], [place]  [address]       Ph: [phone]       Fax: [phone]   RxID:   [id number]

## 2010-08-24 NOTE — Progress Notes (Signed)
Summary: Lab Orders  Phone Note Call from Patient Call back at Home Phone [phone]   Caller: Patient Summary of Call: Pt will be going to the lab for bloodwork 08/22/10, pls send paperwork to lab Initial call taken by: Lannette Donath,  August 18, 2010 4:07 PM  New Problems: DIZZINESS (ICD-780.4)   New Problems: DIZZINESS (ICD-780.4)  Appended Document: Lab Orders lab order printed.  Seymour Bars, D.O.

## 2010-08-24 NOTE — Assessment & Plan Note (Signed)
Summary: COUGHING,DIZZINESS,SINUS PRBLEMS,FEVER/WSE   Vital Signs:  Patient Profile:   55 Years Old Female CC:      productive cough, HA, congestion x 5 days Height:     64.4 inches Weight:      247 pounds O2 Sat:      98 % O2 treatment:    Room Air Temp:     98.8 degrees F oral Pulse rate:   80 / minute Resp:     18 per minute BP sitting:   156 / 95  (left arm) Cuff size:   large  Vitals Entered By: Lajean Saver RN (August 06, 2010 11:58 AM)                  Updated Prior Medication List: WELLBUTRIN XL 300 MG  TB24 (BUPROPION HCL) Take one tablet by mouth once a day LAMICTAL 200 MG TABS (LAMOTRIGINE) Take 1 tablet by mouth once a day SIMVASTATIN 80 MG  TABS (SIMVASTATIN) Take one tablet by mouth once a day HYDROCHLOROTHIAZIDE 25 MG TABS (HYDROCHLOROTHIAZIDE) Take 1 tablet by mouth once a day ECONAZOLE NITRATE 1 %  CREA (ECONAZOLE NITRATE) Apply daily to affected area. BENAZEPRIL HCL 40 MG TABS (BENAZEPRIL HCL) Take 1 tablet by mouth once a day AMLODIPINE BESYLATE 5 MG TABS (AMLODIPINE BESYLATE) Take 1 tablet by mouth once a day TYLENOL/CODEINE #3 300-30 MG TABS (ACETAMINOPHEN-CODEINE) 1-2 tabs by mouth two times a day as needed severe pain CYCLOBENZAPRINE HCL 5 MG TABS (CYCLOBENZAPRINE HCL) 1-2 tabs by mouth at bedtime as needed neck pain * TENS UNIT use as directed ZOLOFT 50 MG TABS (SERTRALINE HCL) Take 1 tablet by mouth once a day  Current Allergies (reviewed today): ! * MONISTAT ! ERYTHROMYCINHistory of Present Illness History from: patient Chief Complaint: productive cough, HA, congestion x 5 days History of Present Illness: 55 Years Old Female complains of onset of cold symptoms for 5 days.  Jazia has been using Tylenol Cold & Sinus which is helping a little bit. No sore throat + cough No pleuritic pain No wheezing + nasal congestion + post-nasal drainage + sinus pain/pressure + chest congestion No itchy/red eyes No earache No hemoptysis No SOB +  chills/sweats No fever No nausea No vomiting No abdominal pain No diarrhea No skin rashes No fatigue No myalgias No headache   REVIEW OF SYSTEMS Constitutional Symptoms      Denies fever, chills, night sweats, weight loss, weight gain, and fatigue.  Eyes       Denies change in vision, eye pain, eye discharge, glasses, contact lenses, and eye surgery. Ear/Nose/Throat/Mouth       Complains of sinus problems.      Denies hearing loss/aids, change in hearing, ear pain, ear discharge, dizziness, frequent runny nose, frequent nose bleeds, sore throat, hoarseness, and tooth pain or bleeding.      Comments: congestion Respiratory       Complains of productive cough.      Denies dry cough, wheezing, shortness of breath, asthma, bronchitis, and emphysema/COPD.  Cardiovascular       Denies murmurs, chest pain, and tires easily with exhertion.      Comments: chest burning with cough   Gastrointestinal       Denies stomach pain, nausea/vomiting, diarrhea, constipation, blood in bowel movements, and indigestion. Genitourniary       Denies painful urination, kidney stones, and loss of urinary control. Neurological       Complains of headaches.      Denies paralysis, seizures,  and fainting/blackouts. Musculoskeletal       Denies muscle pain, joint pain, joint stiffness, decreased range of motion, redness, swelling, muscle weakness, and gout.  Skin       Denies bruising, unusual mles/lumps or sores, and hair/skin or nail changes.  Psych       Denies mood changes, temper/anger issues, anxiety/stress, speech problems, depression, and sleep problems. Other Comments: Patient also c/o nasal pain to the touch. She c/o some dizziness. She fell and hit her head about 1 week ago. CT was done and cleared   Past History:  Past Medical History: Reviewed history from 06/07/2008 and no changes required. impaired fasting glucose/ hx GDM HTN hyperlipidemia bipolar type II (pscyh hospitalization  2006) incest survivor stress incontinence obesity Lumbar DDD- uses TENS unit abdominal incisional hernia  last mammo Spring 2008 pap 2009- Dr Marice Potter  Past Surgical History: TAH with BSO 2001 for an ovarian cyst Cholecystectomy Hysterectomy bladder repair Thyroidectomy  Family History: Reviewed history from 07/08/2007 and no changes required. father died of AMI at 70, high chol, HTN mother breast cancer at 63 Grandmother breast cancer in 67's sister depression  Social History: Reviewed history from 07/08/2007 and no changes required. Used to be a Engineer, civil (consulting) and run businesses, has been a Development worker, international aid. Married, been thru separation.  Divorced x 1. Has son in [place], grown and daughter, Idalia Needle at home (16) Adoped several of her foster kids: Aggie Cosier and Vernona Rieger (sisters), Gaynelle Adu. Quit smoking in 1996. 6 caffeine/ day. Joined the [employer].  Hopes to lose weight. Physical Exam General appearance: well developed, well nourished, no acute distress Ears: normal, no lesions or deformities Nasal: clear discharge Oral/Pharynx: clear PND, no exudates, patent Neck: neck supple,  trachea midline, no masses Chest/Lungs: no rales, wheeze bilateral, breath sounds equal without effort, mild scattered rhonchi Heart: regular rate and  rhythm, no murmur Skin: no obvious rashes or lesions MSE: oriented to time, place, and person Assessment New Problems: BRONCHITIS, ACUTE (ICD-466.0)   Patient Education: Patient and/or caregiver instructed in the following: rest, fluids.  Plan New Medications/Changes: ZITHROMAX Z-PAK 250 MG TABS (AZITHROMYCIN) use as directed  #1 x 0, 08/06/2010, Hoyt Koch MD CHERATUSSIN AC 100-10 MG/5ML SYRP (GUAIFENESIN-CODEINE) 5cc q4-6 hrs as needed cough  #6oz x 0, 08/06/2010, Hoyt Koch MD PREDNISONE (PAK) 10 MG TABS (PREDNISONE) 6 day pack, use as directed  #1 x 0, 08/06/2010, Hoyt Koch MD  New Orders: Est. Patient Level III [11914] Pulse Oximetry (single  measurment) [94760] Planning Comments:   1)  Take the prescribed antibiotic as instructed. 2)  Use nasal saline solution (over the counter) at least 3 times a day. 3)  Use over the counter decongestants like Zyrtec-D every 12 hours as needed to help with congestion.  Caution if High blood pressure. 4)  Can take tylenol every 6 hours or motrin every 8 hours for pain or fever. 5)  Follow up with your primary doctor  if no improvement in 5-7 days, sooner if increasing pain, fever, or new symptoms.   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    The patient and/or caregiver has been counseled thoroughly with regard to medications prescribed including dosage, schedule, interactions, rationale for use, and possible side effects and they verbalize understanding.  Diagnoses and expected course of recovery discussed and will return if not improved as expected or if the condition worsens. Patient and/or caregiver verbalized understanding.  Prescriptions: ZITHROMAX Z-PAK 250 MG TABS (AZITHROMYCIN) use as directed  #1 x 0   Entered and Authorized by:   Hoyt Koch MD   Signed by:   Hoyt Koch MD on 08/06/2010   Method used:   Print then Give to Patient   RxID:   [id number] CHERATUSSIN AC 100-10 MG/5ML SYRP (GUAIFENESIN-CODEINE) 5cc q4-6 hrs as needed cough  #6oz x 0   Entered and Authorized by:   Hoyt Koch MD   Signed by:   Hoyt Koch MD on 08/06/2010   Method used:   Print then Give to Patient   RxID:   [id number] PREDNISONE (PAK) 10 MG TABS (PREDNISONE) 6 day pack, use as directed  #1 x 0   Entered and Authorized by:   Hoyt Koch MD   Signed by:   Hoyt Koch MD on 08/06/2010   Method used:   Print then Give to Patient   RxID:   [id number]   Orders Added: 1)  Est. Patient Level III [53664] 2)  Pulse Oximetry (single measurment) [40347]

## 2010-08-30 NOTE — Assessment & Plan Note (Signed)
Summary: DIZZNESS   Vital Signs:  Patient profile:   55 year old female Height:      64.4 inches Weight:      248 pounds BMI:     42.19 O2 Sat:      98 % on Room air Pulse rate:   86 / minute BP sitting:   151 / 83  (left arm) Cuff size:   large  Vitals Entered By: Payton Spark CMA (August 18, 2010 3:10 PM)  O2 Flow:  Room air CC: Dizzy spells since falling and hitting head a few weeks ago. Normal workup at ED after fall.    Primary Care Provider:  Seymour Bars DO  CC:  Dizzy spells since falling and hitting head a few weeks ago. Normal workup at ED after fall. Marland Kitchen  History of Present Illness: 55 yo WF presents for head contusion that occured 2 wks ago.  She was stepping backwards off a curb taking pictures.  She hit her head on cement and did not brace her fall since she was going backwards.  Denies any LOC, confusion, seizure, vomitting or vision change.  EMS came.  She did not have a laceration.  She had a CT scan that was normal.  She was released that day.  She was given Tylenol 3.  She still has some tenderness to touch and a knot in the occiput but denies daily HAs.  She is having dizzy spells on and off which started PRIOR to her fall.  Denies any memory loss or nausea since this occurence.  Says she is taking all of her meds.  Denies any chest pain, palpitations, edema or SOB.    Current Medications (verified): 1)  Wellbutrin Xl 300 Mg  Tb24 (Bupropion Hcl) .... Take One Tablet By Mouth Once A Day 2)  Lamictal 200 Mg Tabs (Lamotrigine) .... Take 1 Tablet By Mouth Once A Day 3)  Simvastatin 80 Mg  Tabs (Simvastatin) .... Take One Tablet By Mouth Once A Day 4)  Hydrochlorothiazide 25 Mg Tabs (Hydrochlorothiazide) .... Take 1 Tablet By Mouth Once A Day 5)  Econazole Nitrate 1 %  Crea (Econazole Nitrate) .... Apply Daily To Affected Area. 6)  Benazepril Hcl 40 Mg Tabs (Benazepril Hcl) .... Take 1 Tablet By Mouth Once A Day 7)  Amlodipine Besylate 5 Mg Tabs (Amlodipine Besylate)  .... Take 1 Tablet By Mouth Once A Day 8)  Tylenol/codeine #3 300-30 Mg Tabs (Acetaminophen-Codeine) .Marland Kitchen.. 1-2 Tabs By Mouth Two Times A Day As Needed Severe Pain 9)  Cyclobenzaprine Hcl 5 Mg Tabs (Cyclobenzaprine Hcl) .Marland Kitchen.. 1-2 Tabs By Mouth At Bedtime As Needed Neck Pain 10)  Tens Unit .... Use As Directed 11)  Zoloft 50 Mg Tabs (Sertraline Hcl) .... Take 1 Tablet By Mouth Once A Day  Allergies (verified): 1)  ! * Monistat 2)  ! Erythromycin  Past History:  Past Medical History: Reviewed history from 06/07/2008 and no changes required. impaired fasting glucose/ hx GDM HTN hyperlipidemia bipolar type II (pscyh hospitalization 2006) incest survivor stress incontinence obesity Lumbar DDD- uses TENS unit abdominal incisional hernia  last mammo Spring 2008 pap 2009- Dr Marice Potter  Social History: Reviewed history from 07/08/2007 and no changes required. Used to be a Engineer, civil (consulting) and run businesses, has been a Development worker, international aid. Married, been thru separation.  Divorced x 1. Has son in [place], grown and daughter, Idalia Needle at home (8) Adoped several of her foster kids: Aggie Cosier and Vernona Rieger (sisters), Gaynelle Adu. Quit smoking in 1996. 6 caffeine/  day. Joined the [employer].  Hopes to lose weight.  Review of Systems      See HPI  Physical Exam  General:  alert, well-developed, well-nourished, and well-hydrated.  obese Head:  normocephalic and atraumatic.   Eyes:  pupils equal, pupils round, and pupils reactive to light.  no nystagmus Ears:  EACs patent; TMs translucent and gray with good cone of light and bony landmarks.  Nose:  no nasal discharge.   Mouth:  pharynx pink and moist.   Neck:  supple, full ROM, and no masses.   Lungs:  Normal respiratory effort, chest expands symmetrically. Lungs are clear to auscultation, no crackles or wheezes. Heart:  Normal rate and regular rhythm. S1 and S2 normal without gallop, murmur, click, rub or other extra sounds. Pulses:  2+ radial pulses Neurologic:  cranial nerves  II-XII intact.  grip + 5/5, no tremor.  no pass pointing Skin:  no pallor Psych:  good eye contact, not anxious appearing, and not depressed appearing.     Impression & Recommendations:  Problem # 1:  DIZZINESS (ICD-780.4) Dizziness (not true rotational vertigo) x several wks which started before her head contusion. Exam is normal today.  BP is high and I was not able to elicit dizziness.  She did not get lightheaded when rising from seated position quickly. Will work on improving her BP control and will check some labs.    If not improving, will get her in with neuro.  Problem # 2:  CONTUSION, HEAD (ICD-920) Dizziness started prior to her head contusion apparently but given the trauma she did sustain, will repeat CT head today --> normal. Cotninue supportive care.  She does NOT fall into the post concussive syndrome. Orders: T-CT Head w/o cm ([id number]) Ophthalmology Referral (Ophthalmology)  Problem # 3:  ESSENTIAL HYPERTENSION, BENIGN (ICD-401.1) Not well controlled.  Will increase Norvasc from 5--> 10 mg once daily.  REcheck in 2 wks. Her updated medication list for this problem includes:    Hydrochlorothiazide 25 Mg Tabs (Hydrochlorothiazide) .Marland Kitchen... Take 1 tablet by mouth once a day    Benazepril Hcl 40 Mg Tabs (Benazepril hcl) .Marland Kitchen... Take 1 tablet by mouth once a day    Norvasc 10 Mg Tabs (Amlodipine besylate) .Marland Kitchen... 1 tab by mouth daily  BP today: 151/83 Prior BP: 156/95 (08/06/2010)  Labs Reviewed: K+: 4.2 (01/27/2009) Creat: : 0.87 (01/27/2009)   Chol: 140 (01/27/2009)   HDL: 53 (01/27/2009)   LDL: 65 (01/27/2009)   TG: 112 (01/27/2009)  Complete Medication List: 1)  Wellbutrin Xl 300 Mg Tb24 (Bupropion hcl) .... Take one tablet by mouth once a day 2)  Lamictal 200 Mg Tabs (Lamotrigine) .... Take 1 tablet by mouth once a day 3)  Simvastatin 80 Mg Tabs (Simvastatin) .... 1/2 tab by mouth at bedtime 4)  Hydrochlorothiazide 25 Mg Tabs (Hydrochlorothiazide) .... Take 1 tablet by  mouth once a day 5)  Econazole Nitrate 1 % Crea (Econazole nitrate) .... Apply daily to affected area. 6)  Benazepril Hcl 40 Mg Tabs (Benazepril hcl) .... Take 1 tablet by mouth once a day 7)  Norvasc 10 Mg Tabs (Amlodipine besylate) .Marland Kitchen.. 1 tab by mouth daily 8)  Tylenol/codeine #3 300-30 Mg Tabs (Acetaminophen-codeine) .Marland Kitchen.. 1-2 tabs by mouth two times a day as needed severe pain 9)  Cyclobenzaprine Hcl 5 Mg Tabs (Cyclobenzaprine hcl) .Marland Kitchen.. 1-2 tabs by mouth at bedtime as needed neck pain 10)  Tens Unit  .... Use as directed 11)  Zoloft 50 Mg Tabs (Sertraline  hcl) .... Take 1 tablet by mouth once a day  Patient Instructions: 1)  Increase Amlodopine to 10 mg once daily. 2)  Cut simvastatin in half to 40 mg at bedtime. 3)  CT head today. 4)  Will call you w/ results tomorrow. 5)  Will set you up for an eye exam. 6)  REturn for f/u in 2 wks. Prescriptions: NORVASC 10 MG TABS (AMLODIPINE BESYLATE) 1 tab by mouth daily  #30 x 3   Entered and Authorized by:   Seymour Bars DO   Signed by:   Seymour Bars DO on 08/18/2010   Method used:   Electronically to        [facility]  [id number] (retail)       [address]       [place], [place]  [address]       Ph: [phone]       Fax: [phone]   RxID:   [id number]    Orders Added: 1)  T-CT Head w/o cm [70450] 2)  Ophthalmology Referral [Ophthalmology] 3)  Est. Patient Level IV [44034]

## 2010-08-31 ENCOUNTER — Telehealth: Payer: Self-pay | Admitting: Family Medicine

## 2010-08-31 ENCOUNTER — Encounter: Payer: Self-pay | Admitting: Family Medicine

## 2010-08-31 DIAGNOSIS — I10 Essential (primary) hypertension: Secondary | ICD-10-CM

## 2010-09-01 LAB — CONVERTED CEMR LAB
ALT: 26 units/L (ref 0–35)
AST: 20 units/L (ref 0–37)
Basophils Absolute: 0 10*3/uL (ref 0.0–0.1)
Basophils Relative: 0 % (ref 0–1)
Chloride: 108 meq/L (ref 96–112)
Creatinine, Ser: 0.86 mg/dL (ref 0.40–1.20)
Eosinophils Relative: 4 % (ref 0–5)
Hemoglobin: 13.8 g/dL (ref 12.0–15.0)
LDL Cholesterol: 78 mg/dL (ref 0–99)
MCHC: 33.7 g/dL (ref 30.0–36.0)
Monocytes Absolute: 0.5 10*3/uL (ref 0.1–1.0)
Neutro Abs: 3.6 10*3/uL (ref 1.7–7.7)
Platelets: 239 10*3/uL (ref 150–400)
RDW: 13.1 % (ref 11.5–15.5)
Sodium: 144 meq/L (ref 135–145)
Total Bilirubin: 0.4 mg/dL (ref 0.3–1.2)
Total CHOL/HDL Ratio: 2.8
VLDL: 27 mg/dL (ref 0–40)

## 2010-09-06 ENCOUNTER — Ambulatory Visit (INDEPENDENT_AMBULATORY_CARE_PROVIDER_SITE_OTHER): Payer: BC Managed Care – PPO | Admitting: Family Medicine

## 2010-09-06 ENCOUNTER — Encounter: Payer: Self-pay | Admitting: Family Medicine

## 2010-09-06 DIAGNOSIS — I10 Essential (primary) hypertension: Secondary | ICD-10-CM

## 2010-09-06 DIAGNOSIS — R7301 Impaired fasting glucose: Secondary | ICD-10-CM

## 2010-09-07 NOTE — Progress Notes (Signed)
Summary: referral  Phone Note Call from Patient   Caller: Patient Call For: Seymour Bars DO Summary of Call: pt states she would like a referral to a chiropractor because she didnt like the last one she was referred to  Initial call taken by: Avon Gully CMA, Duncan Dull),  August 31, 2010 8:54 AM  Follow-up for Phone Call        So it looks like she saw Dr Arville Care last for chiropractic care.  I don't personally know any other chiropractors in town, so she if free to look in the phone book and call for an appt.  Does not need a referral. Follow-up by: Seymour Bars DO,  August 31, 2010 9:12 AM     Appended Document: referral 08/31/10 acm 1:56 pt notified

## 2010-09-07 NOTE — Assessment & Plan Note (Signed)
Summary: BP check  Nurse Visit   Vital Signs:  Patient profile:   55 year old female Pulse rate:   87 / minute BP sitting:   158 / 86  (right arm) Cuff size:   large  Vitals Entered By: Avon Gully CMA, Duncan Dull) (August 31, 2010 8:50 AM)  History of Present Illness: BP check per pt request     Allergies: 1)  ! * Monistat 2)  ! Erythromycin  Orders Added: 1)  Est. Patient Level I [82956]    Impression & Recommendations:  Problem # 1:  ESSENTIAL HYPERTENSION, BENIGN (ICD-401.1) BP STILL HIGH.  I am going to put her on a COMBO pill - Tribenzor once daily which combines all 3 of her BP meds into one pill but is a little stronger.  I'd like her to pick up samples to try.  RTC for nurse BP check after 2 wks to see how it's working.  Make sure that once she starts Tribenzor that she STOPS the HCTZ, Novasc and Benazepril.   The following medications were removed from the medication list:    Hydrochlorothiazide 25 Mg Tabs (Hydrochlorothiazide) .Marland Kitchen... Take 1 tablet by mouth once a day    Norvasc 10 Mg Tabs (Amlodipine besylate) .Marland Kitchen... 1 tab by mouth daily Her updated medication list for this problem includes:    Tribenzor 40-10-25 Mg Tabs (Olmesartan-amlodipine-hctz) .Marland Kitchen... 1 tab by mouth once daily  BP today: 158/86 Prior BP: 151/83 (08/18/2010)  Labs Reviewed: K+: 4.2 (01/27/2009) Creat: : 0.87 (01/27/2009)   Chol: 140 (01/27/2009)   HDL: 53 (01/27/2009)   LDL: 65 (01/27/2009)   TG: 112 (01/27/2009)  Complete Medication List: 1)  Wellbutrin Xl 300 Mg Tb24 (Bupropion hcl) .... Take one tablet by mouth once a day 2)  Lamictal 200 Mg Tabs (Lamotrigine) .... Take 1 tablet by mouth once a day 3)  Pravastatin Sodium 80 Mg Tabs (Pravastatin sodium) .Marland Kitchen.. 1 tab by mouth qhs 4)  Econazole Nitrate 1 % Crea (Econazole nitrate) .... Apply daily to affected area. 5)  Tribenzor 40-10-25 Mg Tabs (Olmesartan-amlodipine-hctz) .Marland Kitchen.. 1 tab by mouth once daily 6)  Tylenol/codeine #3  300-30 Mg Tabs (Acetaminophen-codeine) .Marland Kitchen.. 1-2 tabs by mouth two times a day as needed severe pain 7)  Cyclobenzaprine Hcl 5 Mg Tabs (Cyclobenzaprine hcl) .Marland Kitchen.. 1-2 tabs by mouth at bedtime as needed neck pain 8)  Tens Unit  .... Use as directed 9)  Zoloft 50 Mg Tabs (Sertraline hcl) .... Take 1 tablet by mouth once a day  Appended Document: BP check 08/31/10 acm 1:56  pt notified

## 2010-09-13 NOTE — Assessment & Plan Note (Signed)
Summary: f/u dizziness   Vital Signs:  Patient profile:   55 year old female Height:      64.4 inches Weight:      248 pounds BMI:     42.19 O2 Sat:      96 % on Room air Pulse rate:   74 / minute BP sitting:   128 / 76  (left arm) Cuff size:   large  Vitals Entered By: Payton Spark CMA (September 06, 2010 10:26 AM)  O2 Flow:  Room air CC: F/U. Started chiropractic therapy and is doing better   Primary Care Provider:  Seymour Bars DO  CC:  F/U. Started chiropractic therapy and is doing better.  History of Present Illness: 55 yo WF presents for f/u visit.  She started seeing [facility] and she thinks that it is helping her neck pain and ROM.  She had some water therapy for her thoracic region which really helped.  She is moving again to a bigger house.  Her dizziness has improved and is barely noticeable now.  She thinks that it may have been stress related.    Her CT head was neg and all of her labs came back normal.    Current Medications (verified): 1)  Wellbutrin Xl 300 Mg  Tb24 (Bupropion Hcl) .... Take One Tablet By Mouth Once A Day 2)  Lamictal 200 Mg Tabs (Lamotrigine) .... Take 1 Tablet By Mouth Once A Day 3)  Pravastatin Sodium 80 Mg Tabs (Pravastatin Sodium) .Marland Kitchen.. 1 Tab By Mouth Qhs 4)  Econazole Nitrate 1 %  Crea (Econazole Nitrate) .... Apply Daily To Affected Area. 5)  Tribenzor 40-10-25 Mg Tabs (Olmesartan-Amlodipine-Hctz) .Marland Kitchen.. 1 Tab By Mouth Once Daily 6)  Tylenol/codeine #3 300-30 Mg Tabs (Acetaminophen-Codeine) .Marland Kitchen.. 1-2 Tabs By Mouth Two Times A Day As Needed Severe Pain 7)  Cyclobenzaprine Hcl 5 Mg Tabs (Cyclobenzaprine Hcl) .Marland Kitchen.. 1-2 Tabs By Mouth At Bedtime As Needed Neck Pain 8)  Tens Unit .... Use As Directed 9)  Zoloft 50 Mg Tabs (Sertraline Hcl) .... Take 1 Tablet By Mouth Once A Day  Allergies (verified): 1)  ! * Monistat 2)  ! Erythromycin  Past History:  Past Medical History: Reviewed history from 06/07/2008 and no changes  required. impaired fasting glucose/ hx GDM HTN hyperlipidemia bipolar type II (pscyh hospitalization 2006) incest survivor stress incontinence obesity Lumbar DDD- uses TENS unit abdominal incisional hernia  last mammo Spring 2008 pap 2009- Dr Marice Potter  Past Surgical History: Reviewed history from 08/06/2010 and no changes required. TAH with [facility] 2001 for an ovarian cyst Cholecystectomy Hysterectomy bladder repair Thyroidectomy  Social History: Reviewed history from 07/08/2007 and no changes required. Used to be a Engineer, civil (consulting) and run businesses, has been a Development worker, international aid. Married, been thru separation.  Divorced x 1. Has son in [place], grown and daughter, Idalia Needle at home (85) Adoped several of her foster kids: Aggie Cosier and Vernona Rieger (sisters), Gaynelle Adu. Quit smoking in 1996. 6 caffeine/ day. Joined the [employer].  Hopes to lose weight.  Review of Systems      See HPI  Physical Exam  General:  alert, well-developed, well-nourished, and well-hydrated.  obese Head:  normocephalic and atraumatic.   Mouth:  pharynx pink and moist.   Neck:  no masses.  slightly limited with rotation to the L and R, decreased in trapezious spasm Lungs:  Normal respiratory effort, chest expands symmetrically. Lungs are clear to auscultation, no crackles or wheezes. Heart:  Normal rate and regular rhythm. S1 and S2  normal without gallop, murmur, click, rub or other extra sounds. Msk:  R transverse process rotation  from T 3-T9, slightly tender with R sided paraspinal fullness Extremities:  trace LE edema Psych:  good eye contact, not anxious appearing, and not depressed appearing.     Impression & Recommendations:  Problem # 1:  DIZZINESS (ICD-780.4) Assessment Improved REsolved.  Her head CT and labs came back normal.  She admits that sleep deprivation and stress along with neck pain probably lead to her symptoms, all of which are getting better.  Problem # 2:  ESSENTIAL HYPERTENSION, BENIGN (ICD-401.1) Improved and now  at goal.  Continue current meds.  REviewed recent labs. Her updated medication list for this problem includes:    Tribenzor 40-10-25 Mg Tabs (Olmesartan-amlodipine-hctz) .Marland Kitchen... 1 tab by mouth once daily  BP today: 128/76 Prior BP: 158/86 (08/31/2010)  Labs Reviewed: K+: 3.9 (08/31/2010) Creat: : 0.86 (08/31/2010)   Chol: 162 (08/31/2010)   HDL: 57 (08/31/2010)   LDL: 78 (08/31/2010)   TG: 136 (08/31/2010)  Problem # 3:  IMPAIRED FASTING GLUCOSE (ICD-790.21) Glucose 120 on labs last wk.  She plans to work on her diet and exercise after she moves into her new house.  Plan to recheck WT/BP and sugar in 2 mos.  Complete Medication List: 1)  Wellbutrin Xl 300 Mg Tb24 (Bupropion hcl) .... Take one tablet by mouth once a day 2)  Lamictal 200 Mg Tabs (Lamotrigine) .... Take 1 tablet by mouth once a day 3)  Pravastatin Sodium 80 Mg Tabs (Pravastatin sodium) .Marland Kitchen.. 1 tab by mouth qhs 4)  Econazole Nitrate 1 % Crea (Econazole nitrate) .... Apply daily to affected area. 5)  Tribenzor 40-10-25 Mg Tabs (Olmesartan-amlodipine-hctz) .Marland Kitchen.. 1 tab by mouth once daily 6)  Tylenol/codeine #3 300-30 Mg Tabs (Acetaminophen-codeine) .Marland Kitchen.. 1-2 tabs by mouth two times a day as needed severe pain 7)  Cyclobenzaprine Hcl 5 Mg Tabs (Cyclobenzaprine hcl) .Marland Kitchen.. 1-2 tabs by mouth at bedtime as needed neck pain 8)  Tens Unit  .... Use as directed 9)  Zoloft 50 Mg Tabs (Sertraline hcl) .... Take 1 tablet by mouth once a day  Patient Instructions: 1)  Stay on current meds. 2)  Labs looked great. 3)  Continue chiropractic treatments for neck and T spine. 4)  Good luck with the move. 5)  128/76 BP manually-- GREAT!  Stay on Tribenzor. 6)  REturn for f/u in 2 mos.   Orders Added: 1)  Est. Patient Level III [29528]

## 2010-09-27 ENCOUNTER — Ambulatory Visit
Admission: RE | Admit: 2010-09-27 | Discharge: 2010-09-27 | Disposition: A | Discharge status: Home / Self Care | Payer: BC Managed Care – PPO | Source: Ambulatory Visit | Attending: Family Medicine | Admitting: Family Medicine

## 2010-09-27 ENCOUNTER — Encounter: Payer: Self-pay | Admitting: Family Medicine

## 2010-09-27 ENCOUNTER — Ambulatory Visit (INDEPENDENT_AMBULATORY_CARE_PROVIDER_SITE_OTHER): Payer: BC Managed Care – PPO | Admitting: Family Medicine

## 2010-09-27 ENCOUNTER — Other Ambulatory Visit: Payer: Self-pay | Admitting: Family Medicine

## 2010-09-27 DIAGNOSIS — M79676 Pain in unspecified toe(s): Secondary | ICD-10-CM

## 2010-09-27 DIAGNOSIS — R209 Unspecified disturbances of skin sensation: Secondary | ICD-10-CM | POA: Insufficient documentation

## 2010-09-27 DIAGNOSIS — M79609 Pain in unspecified limb: Secondary | ICD-10-CM

## 2010-10-03 NOTE — Assessment & Plan Note (Signed)
Summary: Toe pain s/p fall   Vital Signs:  Patient profile:   55 year old female Height:      64.4 inches Weight:      246 pounds Pulse rate:   88 / minute BP sitting:   124 / 78  (right arm) Cuff size:   large  Vitals Entered By: Avon Gully CMA, Duncan Dull) (September 27, 2010 11:32 AM) CC: fell yesterday and cant move rt big toe and left shoulder and arm feel numb and tingly   Primary Care Provider:  Seymour Bars DO  CC:  fell yesterday and cant move rt big toe and left shoulder and arm feel numb and tingly.  History of Present Illness: fell yesterday and cant move rt big toe and left shoulder and arm feel numb and tingly. Was going up step and foot caught on the step and fell onto a porch. Unable to flex her big toe.   Now having tinling into the left arm and shoulder. NOt stopping her activities. Able to fully move her shoulder witout pain.    Current Medications (verified): 1)  Wellbutrin Xl 300 Mg  Tb24 (Bupropion Hcl) .... Take One Tablet By Mouth Once A Day 2)  Lamictal 200 Mg Tabs (Lamotrigine) .... Take 1 Tablet By Mouth Once A Day 3)  Pravastatin Sodium 80 Mg Tabs (Pravastatin Sodium) .Marland Kitchen.. 1 Tab By Mouth Qhs 4)  Econazole Nitrate 1 %  Crea (Econazole Nitrate) .... Apply Daily To Affected Area. 5)  Tribenzor 40-10-25 Mg Tabs (Olmesartan-Amlodipine-Hctz) .Marland Kitchen.. 1 Tab By Mouth Once Daily 6)  Tylenol/codeine #3 300-30 Mg Tabs (Acetaminophen-Codeine) .Marland Kitchen.. 1-2 Tabs By Mouth Two Times A Day As Needed Severe Pain 7)  Cyclobenzaprine Hcl 5 Mg Tabs (Cyclobenzaprine Hcl) .Marland Kitchen.. 1-2 Tabs By Mouth At Bedtime As Needed Neck Pain 8)  Tens Unit .... Use As Directed 9)  Zoloft 50 Mg Tabs (Sertraline Hcl) .... Take 1 Tablet By Mouth Once A Day  Allergies (verified): 1)  ! * Monistat 2)  ! Erythromycin  Comments:  Nurse/Medical Assistant: The patient's medications and allergies were reviewed with the patient and were updated in the Medication and Allergy Lists. Avon Gully CMA, Duncan Dull)  (September 27, 2010 11:33 AM)  Physical Exam  General:  Well-developed,well-nourished,in no acute distress; alert,appropriate and cooperative throughout examination Msk:  Right great toe is swollen and is tender over the distal joint and the MT.  NO bruising. SHe can flex at the base but cannot flex the distal joint.  Skin:  Left shoulder, elbow and wrist with NROM.   Sensation intake.    Impression & Recommendations:  Problem # 1:  TOE PAIN (ICD-729.5) Took an Alevel and used a Tylenol #3 last night and that helped some.   Will get an xray to evaluate for fracture. Elevate, ice and rest the toe.   We will call with the results.  Orders: T-DG Toe Great*R* [phone])  Problem # 2:  NUMBNESS, ARM (ICD-782.0) Suspect secondary to the fall. It is not currently numbn and her ROM is normal. SHe may have hyperextended her wrist as she does have some sorenes in the wrist but the ROM is normal.   Complete Medication List: 1)  Wellbutrin Xl 300 Mg Tb24 (Bupropion hcl) .... Take one tablet by mouth once a day 2)  Lamictal 200 Mg Tabs (Lamotrigine) .... Take 1 tablet by mouth once a day 3)  Pravastatin Sodium 80 Mg Tabs (Pravastatin sodium) .Marland Kitchen.. 1 tab by mouth qhs 4)  Econazole Nitrate 1 % Crea (Econazole nitrate) .... Apply daily to affected area. 5)  Tribenzor 40-10-25 Mg Tabs (Olmesartan-amlodipine-hctz) .Marland Kitchen.. 1 tab by mouth once daily 6)  Tylenol/codeine #3 300-30 Mg Tabs (Acetaminophen-codeine) .Marland Kitchen.. 1-2 tabs by mouth two times a day as needed severe pain 7)  Cyclobenzaprine Hcl 5 Mg Tabs (Cyclobenzaprine hcl) .Marland Kitchen.. 1-2 tabs by mouth at bedtime as needed neck pain 8)  Tens Unit  .... Use as directed 9)  Zoloft 50 Mg Tabs (Sertraline hcl) .... Take 1 tablet by mouth once a day  Patient Instructions: 1)  Will call you with the xray results.  2)  We can ice the toe.  Prescriptions: TYLENOL/CODEINE #3 300-30 MG TABS (ACETAMINOPHEN-CODEINE) 1-2 tabs by mouth two times a day as needed severe pain  #24 x  0   Entered and Authorized by:   Nani Gasser MD   Signed by:   Nani Gasser MD on 09/27/2010   Method used:   Printed then faxed to ...       [facility]  [address] [phone]* (retail)       [address]       [place], [place]  [address]       Ph: [phone]       Fax: [phone]   RxID:   [id number]    Orders Added: 1)  T-DG Toe Great*R* [73660] 2)  Est. Patient Level IV [24401]

## 2010-10-09 ENCOUNTER — Encounter: Payer: Self-pay | Admitting: Family Medicine

## 2010-10-11 ENCOUNTER — Other Ambulatory Visit: Payer: Self-pay | Admitting: Family Medicine

## 2010-10-11 ENCOUNTER — Ambulatory Visit (INDEPENDENT_AMBULATORY_CARE_PROVIDER_SITE_OTHER): Payer: BC Managed Care – PPO | Admitting: Family Medicine

## 2010-10-11 ENCOUNTER — Ambulatory Visit
Admission: RE | Admit: 2010-10-11 | Discharge: 2010-10-11 | Disposition: A | Discharge status: Home / Self Care | Payer: BC Managed Care – PPO | Source: Ambulatory Visit | Attending: Family Medicine | Admitting: Family Medicine

## 2010-10-11 VITALS — BP 136/72 | HR 74 | Ht 65.0 in | Wt 250.0 lb

## 2010-10-11 DIAGNOSIS — R102 Pelvic and perineal pain: Secondary | ICD-10-CM

## 2010-10-11 DIAGNOSIS — M25552 Pain in left hip: Secondary | ICD-10-CM

## 2010-10-11 DIAGNOSIS — M25559 Pain in unspecified hip: Secondary | ICD-10-CM

## 2010-10-11 DIAGNOSIS — M25551 Pain in right hip: Secondary | ICD-10-CM

## 2010-10-11 DIAGNOSIS — E669 Obesity, unspecified: Secondary | ICD-10-CM

## 2010-10-11 MED ORDER — ACETAMINOPHEN-CODEINE #3 300-30 MG PO TABS: 1.0000 | ORAL_TABLET | Freq: Four times a day (QID) | ORAL | Status: DC | PRN

## 2010-10-11 MED ORDER — PHENTERMINE HCL 15 MG PO CAPS: 15.0000 mg | ORAL_CAPSULE | ORAL | Status: DC

## 2010-10-11 NOTE — Patient Instructions (Addendum)
Xray hips downstairs today. Will call you w/ results tomorrow.  Use Tylenol #3 for severe pain, ibuprofen for less severe pain.  Start Phentermine 15 mg in the AM, 30 min before breakfast. Work on healthy diet (avoid snacking but eat breakfast, lunch and dinner).  Focus on lean protein, lots of veggies, some fruit and limited complex carbohydrates.  Avoid highly sweetened foods/ drinks.  Call if any problems.  Return for f/u weight/ hip pain in 1 month.

## 2010-10-11 NOTE — Progress Notes (Signed)
  Subjective:    Patient ID: Sharon Hoover, female    DOB: 13-Mar-1956, 55 y.o.   MRN: [id number]  HPI  55 yo WF presents for problems with pain  In her hips on and off since 2004.  She feels like there is 'grating' when she walks.  She is showing houses more, going up and down stairs.  The R hip hurts more than the L side.  She doesn't ever remember if she's had an xray.  Her hip pain resolves with rest.  Definitely worse with prolonged walking.  She has radiation into the groin and down to her knee.  She has a known hx of lumbar DDD.  She just joined a gym, wants to exercise and lose weight.    She would really like to work on weight loss.  Struggles with emotional eating.  She feels motivated to get back on track now. Denies chest pain or DOE.  BP 136/72  Pulse 74  Ht 5\' 5"  (1.651 m)  Wt 250 lb (113.399 kg)  BMI 41.60 kg/m2  SpO2 98%     Review of Systems  Constitutional: Positive for fatigue. Negative for fever.  Respiratory: Negative for shortness of breath.   Cardiovascular: Negative for chest pain, palpitations and leg swelling.  Musculoskeletal: Positive for back pain, arthralgias and gait problem.  Psychiatric/Behavioral: Negative for sleep disturbance and dysphoric mood. The patient is not nervous/anxious.         Objective:   Physical Exam  Constitutional: She appears well-developed and well-nourished. No distress.       obese  Eyes: Conjunctivae are normal. No scleral icterus.  Neck: No thyromegaly present.  Cardiovascular: Normal rate, regular rhythm and normal heart sounds.   No murmur heard. Pulmonary/Chest: Effort normal and breath sounds normal. No respiratory distress.  Musculoskeletal: She exhibits no edema.       Limited R hip ROM with int/ ext rotation.  Neg FABER test.  Full L spine ROM with tenderness on extension.    Psychiatric: She has a normal mood and affect.           Assessment & Plan:   Past Medical History  Diagnosis Date  . Impaired  fasting glucose   . Hypertension   . Hyperlipidemia   . Bipolar 2 disorder     pscyh hospitaliztion 2006  . Stress incontinence, female   . Obesity   . DDD (degenerative disc disease), lumbar     uses tens unit  . Hernia     abdominal incisional   History   Social History  . Marital Status: Married    Spouse Name: N/A    Number of Children: N/A  . Years of Education: N/A   Occupational History  . Not on file.   Social History Main Topics  . Smoking status: Former Smoker    Quit date: 07/23/1994  . Smokeless tobacco: Not on file  . Alcohol Use:   . Drug Use:   . Sexually Active:    Other Topics Concern  . Not on file   Social History Narrative  . No narrative on file   Past Surgical History  Procedure Date  . Total abdominal hysterectomy w/ bilateral salpingoophorectomy 2001    for ovarian cyst  . Cholecystectomy   . Abdominal hysterectomy   . Bladder repair   . Thyroidectomy

## 2010-11-01 ENCOUNTER — Encounter (HOSPITAL_COMMUNITY): Payer: Self-pay | Admitting: Psychiatry

## 2010-11-15 ENCOUNTER — Ambulatory Visit (INDEPENDENT_AMBULATORY_CARE_PROVIDER_SITE_OTHER): Payer: BC Managed Care – PPO | Admitting: Family Medicine

## 2010-11-15 ENCOUNTER — Encounter (INDEPENDENT_AMBULATORY_CARE_PROVIDER_SITE_OTHER): Payer: BC Managed Care – PPO | Admitting: Psychiatry

## 2010-11-15 ENCOUNTER — Encounter: Payer: Self-pay | Admitting: Family Medicine

## 2010-11-15 DIAGNOSIS — M719 Bursopathy, unspecified: Secondary | ICD-10-CM

## 2010-11-15 DIAGNOSIS — M7550 Bursitis of unspecified shoulder: Secondary | ICD-10-CM | POA: Insufficient documentation

## 2010-11-15 DIAGNOSIS — M67919 Unspecified disorder of synovium and tendon, unspecified shoulder: Secondary | ICD-10-CM

## 2010-11-15 DIAGNOSIS — I10 Essential (primary) hypertension: Secondary | ICD-10-CM

## 2010-11-15 DIAGNOSIS — F339 Major depressive disorder, recurrent, unspecified: Secondary | ICD-10-CM

## 2010-11-15 MED ORDER — PHENTERMINE HCL 37.5 MG PO CAPS: 37.5000 mg | ORAL_CAPSULE | ORAL | Status: DC

## 2010-11-15 NOTE — Progress Notes (Signed)
  Subjective:    Patient ID: Sharon Hoover, female    DOB: 17-Feb-1956, 55 y.o.   MRN: [id number]  HPI  55 yo WF presents for f/u weight.  She tends to emotionally eat.  She startaed Phentermine 15 mg qAM 1 month ago and only lost 2 lbs.  She feels like it worked for the first week but then wore off.  She has started to make some dieatary changes but she is on the go all the time and time is a factor. She did join [employer] and has been going 2 days a wk with a friend.  She denies heart palpitations, chest pain or SOB.    She is working long hours and not getting enough sleep.  She continues to have some L shoulder pain on and off but has not lost strength or ROM.  She is currently taking Aleve 1-2 tabs/ day which has been helping her pain.  BP 125/65  Pulse 67  Ht 5\' 4"  (1.626 m)  Wt 248 lb (112.492 kg)  BMI 42.57 kg/m2  SpO2 100%    Review of Systems  Constitutional: Positive for fatigue.  Respiratory: Negative for shortness of breath.   Cardiovascular: Negative for chest pain and palpitations.  Psychiatric/Behavioral: Negative for dysphoric mood and agitation. The patient is not nervous/anxious.        Objective:   Physical Exam  Constitutional: She appears well-developed and well-nourished. No distress.       obese  HENT:  Head: Normocephalic and atraumatic.  Eyes: No scleral icterus.  Neck: No thyromegaly present.  Cardiovascular: Normal rate, regular rhythm and normal heart sounds.   Pulmonary/Chest: Effort normal and breath sounds normal.  Musculoskeletal: She exhibits no edema.  Lymphadenopathy:    She has no cervical adenopathy.  Skin: Skin is warm and dry.  Psychiatric: She has a normal mood and affect.          Assessment & Plan:

## 2010-11-15 NOTE — Assessment & Plan Note (Signed)
BP looks good on current meds.  Continue.

## 2010-11-15 NOTE — Patient Instructions (Signed)
Phentermine 37.5 mg sent to pharmacy.  Take in the AM, 30 min before breakfast along with healthy diet and regular exercise.  Return for nurse visit in 4 wks- WT/ BP check

## 2010-11-15 NOTE — Assessment & Plan Note (Addendum)
Obesity, class III with BMI of 42, only 2 lbs lost on lower dose of Phentermine x 1 month total.  She has tried to make more conscious food choices and is now working out 2 days a wk from none.  This is a positive change.  Her BP and HR are normal and she's not had any adverse SEs.  Complications of her obesity include IFG, HTN, hyperlipidemia, asthma and LBP.  Will go up to 37.5 mg on her phentermine qAM along with diet and exercise.  We reviewed her goals.  Call me if any problems.  RTC for a nurse visit in 4 wks.

## 2010-11-15 NOTE — Assessment & Plan Note (Signed)
L shoulder bursitis with months of pain but full ROM on exam today and no functional loss.  She is happy with staying on Aleve for now and is not having GI upset, bleeding or other problem from this.  She opts to hold off on injection with sports med.  She is already a pt downstairs and can call if this gets worse.

## 2010-11-29 ENCOUNTER — Other Ambulatory Visit: Payer: Self-pay | Admitting: Family Medicine

## 2010-11-29 NOTE — Telephone Encounter (Signed)
Pt called and needs tribenzor 40-10-25 refilled and prefers samples if she can get them.  Do we have samples and can we give if so.  Please advise. Plan:  Routed to Dr. Arlice Colt, LPN [facility]

## 2010-11-30 ENCOUNTER — Encounter: Payer: Self-pay | Admitting: Family Medicine

## 2010-11-30 DIAGNOSIS — M25559 Pain in unspecified hip: Secondary | ICD-10-CM | POA: Insufficient documentation

## 2010-11-30 MED ORDER — OLMESARTAN-AMLODIPINE-HCTZ 40-10-25 MG PO TABS: ORAL_TABLET | ORAL | Status: DC

## 2010-11-30 NOTE — Telephone Encounter (Signed)
Samples were given to her yesterday. RFs will be done also.

## 2010-11-30 NOTE — Telephone Encounter (Signed)
Acknowledged that patient had been taken care of yesterday per Dr. Cathey Endow note re samples and Rx's. Jarvis Newcomer, LPN [facility]

## 2010-11-30 NOTE — Assessment & Plan Note (Signed)
BMI 41.6 c/w class III obesity, complicated by LBP, IFG, HTN and dyslipidemia. She feels motivated to work on [employer] and regular exercise.  Discussed plan to proceed with this and will add Phentermine for short term use to help curb her appetitie.  RTC in 1 mo.  Reviewed common adverse SEs and call if any problems.

## 2010-11-30 NOTE — Assessment & Plan Note (Signed)
bilat hip pain R>L w/o trauma.  Will obtain XRay to look for DJD today.  RFd Tylenol # 3 for severe pain.  Will proceed with plan to get her hip pain treated so that she can start an exercise program.

## 2010-12-13 ENCOUNTER — Ambulatory Visit: Payer: BC Managed Care – PPO | Admitting: Family Medicine

## 2011-01-23 ENCOUNTER — Other Ambulatory Visit: Payer: Self-pay | Admitting: Family Medicine

## 2011-01-23 MED ORDER — OLMESARTAN-AMLODIPINE-HCTZ 40-10-25 MG PO TABS: ORAL_TABLET | ORAL | Status: DC

## 2011-01-23 NOTE — Telephone Encounter (Signed)
Pt called and is experiencing financial diffuiculties and wants to know if she can have samples of Tribenzor 40/10/25 for her blood press until she can get her check the middle of the month.   Plan:  Sample given for # 7, Lot # [id number]  Exps10/13. Pt overdue for 4 week follow up with the provider.  Scheduled pt for 01-31-11 @ 10 am while I had her on  the phone.  Jarvis Newcomer, LPN [facility]

## 2011-01-31 ENCOUNTER — Ambulatory Visit: Payer: BC Managed Care – PPO | Admitting: Family Medicine

## 2011-02-14 ENCOUNTER — Encounter (INDEPENDENT_AMBULATORY_CARE_PROVIDER_SITE_OTHER): Payer: BC Managed Care – PPO | Admitting: Psychiatry

## 2011-02-14 DIAGNOSIS — F319 Bipolar disorder, unspecified: Secondary | ICD-10-CM

## 2011-04-05 ENCOUNTER — Ambulatory Visit (INDEPENDENT_AMBULATORY_CARE_PROVIDER_SITE_OTHER): Payer: BC Managed Care – PPO | Admitting: Family Medicine

## 2011-04-05 ENCOUNTER — Encounter: Payer: Self-pay | Admitting: Family Medicine

## 2011-04-05 VITALS — BP 152/81 | HR 66 | Ht 65.0 in | Wt 245.0 lb

## 2011-04-05 DIAGNOSIS — M533 Sacrococcygeal disorders, not elsewhere classified: Secondary | ICD-10-CM

## 2011-04-05 DIAGNOSIS — R21 Rash and other nonspecific skin eruption: Secondary | ICD-10-CM

## 2011-04-05 DIAGNOSIS — M25559 Pain in unspecified hip: Secondary | ICD-10-CM

## 2011-04-05 DIAGNOSIS — I10 Essential (primary) hypertension: Secondary | ICD-10-CM

## 2011-04-05 DIAGNOSIS — M546 Pain in thoracic spine: Secondary | ICD-10-CM

## 2011-04-05 DIAGNOSIS — M549 Dorsalgia, unspecified: Secondary | ICD-10-CM

## 2011-04-05 DIAGNOSIS — M25551 Pain in right hip: Secondary | ICD-10-CM

## 2011-04-05 DIAGNOSIS — L570 Actinic keratosis: Secondary | ICD-10-CM

## 2011-04-05 MED ORDER — MELOXICAM 7.5 MG PO TABS: 7.5000 mg | ORAL_TABLET | Freq: Every day | ORAL | Status: DC

## 2011-04-05 MED ORDER — ACETAMINOPHEN-CODEINE #3 300-30 MG PO TABS: 1.0000 | ORAL_TABLET | Freq: Four times a day (QID) | ORAL | Status: DC | PRN

## 2011-04-05 NOTE — Progress Notes (Signed)
  Subjective:    Patient ID: Sharon Hoover, female    DOB: 1955-09-22, 55 y.o.   MRN: [id number]  HPI  Patient is here because of back pain and buttocks pain. The buttocks and hip pain has persisted since her last visit. She has a DX of multiple disc DX and also complains of thoracic area pain as well. She uses tylenol #3, tens unit and naprosyn for the pain.   Review of Systems  Constitutional: Positive for activity change.  Musculoskeletal: Positive for myalgias, back pain, arthralgias and gait problem.  Skin:       She has skin issues from actenic keratosis of her L forearm to a rash on the bridge of her nose       Objective:   Physical Exam  Constitutional: She is oriented to person, place, and time. She appears well-developed and well-nourished.       Obese WF  HENT:  Head: Normocephalic and atraumatic.  Neck: Neck supple.  Cardiovascular:       BP elevated at 152/81  Musculoskeletal:       Tenderness over both side of her hip  Neurological: She is alert and oriented to person, place, and time.  Skin:       Actinic keratosis of the L arm. Non descrip rash on the L side of her nostril          Assessment & Plan:  Expressed my concern the hip pain and now pain over thoracic area may be related to her HX of herniated disc will recommend at this time [facility] referral Stress need to lose weight Concern over elevated BP return in 2 months for follow up Renew tylenol #3 Switch to mobic from naprosyn Due to multiple skin lesions will do a Dermatology referral as well Patient declines flu vaccine

## 2011-04-05 NOTE — Patient Instructions (Signed)
Expressed my concern the hip pain and now pain over thoracic area may be related to her HX of herniated disc will recommend at this time [facility] referral Stress need to lose weight Concern over elevated BP return in 2 months for follow up Renew tylenol #3 Switch to mobic from naprosyn Due to multiple skin lesions will do a Dermatology referral as well

## 2011-04-11 ENCOUNTER — Other Ambulatory Visit: Payer: Self-pay | Admitting: Sports Medicine

## 2011-04-11 ENCOUNTER — Ambulatory Visit
Admission: RE | Admit: 2011-04-11 | Discharge: 2011-04-11 | Disposition: A | Discharge status: Home / Self Care | Payer: BC Managed Care – PPO | Source: Ambulatory Visit | Attending: Sports Medicine | Admitting: Sports Medicine

## 2011-04-11 DIAGNOSIS — M545 Low back pain, unspecified: Secondary | ICD-10-CM

## 2011-04-13 ENCOUNTER — Other Ambulatory Visit: Payer: Self-pay | Admitting: Orthopedic Surgery

## 2011-04-13 DIAGNOSIS — M545 Low back pain, unspecified: Secondary | ICD-10-CM

## 2011-04-20 ENCOUNTER — Ambulatory Visit
Admission: RE | Admit: 2011-04-20 | Discharge: 2011-04-20 | Disposition: A | Discharge status: Home / Self Care | Payer: BC Managed Care – PPO | Source: Ambulatory Visit | Attending: Orthopedic Surgery | Admitting: Orthopedic Surgery

## 2011-04-20 DIAGNOSIS — M545 Low back pain, unspecified: Secondary | ICD-10-CM

## 2011-05-15 ENCOUNTER — Encounter (INDEPENDENT_AMBULATORY_CARE_PROVIDER_SITE_OTHER): Payer: BC Managed Care – PPO | Admitting: Psychiatry

## 2011-05-15 DIAGNOSIS — F319 Bipolar disorder, unspecified: Secondary | ICD-10-CM

## 2011-05-17 ENCOUNTER — Other Ambulatory Visit: Payer: Self-pay | Admitting: *Deleted

## 2011-05-17 MED ORDER — OLMESARTAN-AMLODIPINE-HCTZ 40-10-25 MG PO TABS: ORAL_TABLET | ORAL | Status: DC

## 2011-06-05 ENCOUNTER — Ambulatory Visit: Payer: BC Managed Care – PPO | Admitting: Family Medicine

## 2011-06-05 DIAGNOSIS — Z0289 Encounter for other administrative examinations: Secondary | ICD-10-CM

## 2011-06-07 ENCOUNTER — Other Ambulatory Visit: Payer: Self-pay | Admitting: *Deleted

## 2011-06-07 MED ORDER — ECONAZOLE NITRATE 1 % EX CREA: TOPICAL_CREAM | Freq: Every day | CUTANEOUS | Status: DC

## 2011-06-19 ENCOUNTER — Telehealth: Payer: Self-pay | Admitting: *Deleted

## 2011-06-19 DIAGNOSIS — M25551 Pain in right hip: Secondary | ICD-10-CM

## 2011-06-19 MED ORDER — ACETAMINOPHEN-CODEINE #3 300-30 MG PO TABS: 1.0000 | ORAL_TABLET | Freq: Four times a day (QID) | ORAL | Status: DC | PRN

## 2011-06-19 NOTE — Telephone Encounter (Signed)
Renewed her tylenol #3 done on 06/19/2011.

## 2011-06-19 NOTE — Telephone Encounter (Signed)
Pt calls and wanted to know if you would give her another rx for the tylenol #3

## 2011-06-19 NOTE — Telephone Encounter (Signed)
Patient called and wants to know if Dr. Thurmond Butts will refill her Tylenol 3? Please call patient back and let her know at [phone]. Thanks, [employer]

## 2011-07-09 ENCOUNTER — Other Ambulatory Visit: Payer: Self-pay | Admitting: *Deleted

## 2011-07-11 ENCOUNTER — Other Ambulatory Visit: Payer: Self-pay | Admitting: Family Medicine

## 2011-07-11 ENCOUNTER — Encounter: Payer: Self-pay | Admitting: *Deleted

## 2011-07-11 ENCOUNTER — Emergency Department
Admission: EM | Admit: 2011-07-11 | Discharge: 2011-07-11 | Disposition: A | Discharge status: Home / Self Care | Payer: BC Managed Care – PPO | Source: Home / Self Care | Attending: Emergency Medicine | Admitting: Emergency Medicine

## 2011-07-11 DIAGNOSIS — J111 Influenza due to unidentified influenza virus with other respiratory manifestations: Secondary | ICD-10-CM

## 2011-07-11 HISTORY — DX: Pure hypercholesterolemia, unspecified: E78.00

## 2011-07-11 HISTORY — DX: Reserved for concepts with insufficient information to code with codable children: IMO0002

## 2011-07-11 MED ORDER — OSELTAMIVIR PHOSPHATE 75 MG PO CAPS: 75.0000 mg | ORAL_CAPSULE | Freq: Two times a day (BID) | ORAL | Status: AC

## 2011-07-11 NOTE — ED Notes (Signed)
C/o flu like symptoms x 1 day; coughing, body aches, lethargy

## 2011-07-11 NOTE — ED Provider Notes (Signed)
History     CSN: [id number] Arrival date & time: 07/11/2011  3:56 PM   First MD Initiated Contact with Patient 07/11/11 1602      Chief Complaint  Patient presents with  . Influenza    (Consider location/radiation/quality/duration/timing/severity/associated sxs/prior treatment) HPI Sharon Hoover is a 55 y.o. female who complains of onset of cold symptoms for 1 days. There are 2 people in her house who've been diagnosed with influenza. She did not have a flu shot this year and. + sore throat + cough No pleuritic pain No wheezing + nasal congestion + post-nasal drainage No sinus pain/pressure No chest congestion No itchy/red eyes No earache No hemoptysis No SOB + chills/sweats + fever No nausea No vomiting No abdominal pain No diarrhea No skin rashes + fatigue + myalgias + headache    Past Medical History  Diagnosis Date  . Impaired fasting glucose   . Hypertension   . Hyperlipidemia   . Bipolar 2 disorder     pscyh hospitaliztion 2006  . Stress incontinence, female   . Obesity   . DDD (degenerative disc disease), lumbar     uses tens unit  . Hernia     abdominal incisional  . Hypercholesteremia   . Herniated disc     Past Surgical History  Procedure Date  . Total abdominal hysterectomy w/ bilateral salpingoophorectomy 2001    for ovarian cyst  . Cholecystectomy   . Abdominal hysterectomy   . Bladder repair   . Thyroidectomy   . Bladder repair   . Thyroidectomy     Family History  Problem Relation Age of Onset  . Cancer Mother 21    breast  . Other Father     AMI  . Hyperlipidemia Father   . Hypertension Father   . Heart attack Father   . Depression Sister   . Cancer Other 80    breast    History  Substance Use Topics  . Smoking status: Former Smoker    Quit date: 07/23/1994  . Smokeless tobacco: Not on file  . Alcohol Use: Yes     very rarely    OB History    Grav Para Term Preterm Abortions TAB SAB Ect Mult Living                  Review of Systems  Allergies  Erythromycin; Grapefruit diet or; and Other  Home Medications   Current Outpatient Rx  Name Route Sig Dispense Refill  . ACETAMINOPHEN-CODEINE #3 300-30 MG PO TABS Oral Take 1 tablet by mouth every 6 (six) hours as needed. For severe pain 50 tablet 0  . BUPROPION HCL ER (XL) 300 MG PO TB24 Oral Take 300 mg by mouth daily.      . ECONAZOLE NITRATE 1 % EX CREA Topical Apply topically daily. Apply topically daily. 15 g 1  . LAMOTRIGINE 200 MG PO TABS Oral Take 200 mg by mouth daily.      . MELOXICAM 7.5 MG PO TABS Oral Take 1 tablet (7.5 mg total) by mouth daily. 30 tablet 2  . NON FORMULARY  Tens unit     . OLMESARTAN-AMLODIPINE-HCTZ 40-10-25 MG PO TABS  1 tab po daily 30 tablet 3  . OSELTAMIVIR PHOSPHATE 75 MG PO CAPS Oral Take 1 capsule (75 mg total) by mouth 2 (two) times daily. 10 capsule 0  . PRAVASTATIN SODIUM 80 MG PO TABS Oral Take 80 mg by mouth daily.      . SERTRALINE HCL 50  MG PO TABS Oral Take 50 mg by mouth daily.        BP 144/77  Pulse 85  Temp(Src) 99.1 F (37.3 C) (Oral)  Resp 18  Ht 5\' 5"  (1.651 m)  Wt 250 lb (113.399 kg)  BMI 41.60 kg/m2  SpO2 98%  Physical Exam  Nursing note and vitals reviewed. Constitutional: She is oriented to person, place, and time. She appears well-developed and well-nourished.  HENT:  Head: Normocephalic and atraumatic.  Right Ear: Tympanic membrane, external ear and ear canal normal.  Left Ear: Tympanic membrane, external ear and ear canal normal.  Nose: Mucosal edema and rhinorrhea present.  Mouth/Throat: Posterior oropharyngeal erythema present. No oropharyngeal exudate or posterior oropharyngeal edema.  Eyes: No scleral icterus.  Neck: Neck supple.  Cardiovascular: Regular rhythm and normal heart sounds.   Pulmonary/Chest: Effort normal and breath sounds normal. No respiratory distress.  Neurological: She is alert and oriented to person, place, and time.  Skin: Skin is warm and dry.   Psychiatric: She has a normal mood and affect. Her speech is normal.    ED Course  Procedures (including critical care time)  Labs Reviewed - No data to display No results found.   1. Influenza       MDM  1)  Tamiflu Rx per her request 2)  Use nasal saline solution (over the counter) at least 3 times a day. 3)  Use over the counter decongestants like Zyrtec-D every 12 hours as needed to help with congestion.  If you have hypertension, do not take medicines with sudafed.  4)  Can take tylenol every 6 hours or motrin every 8 hours for pain or fever. 5)  Follow up with your primary doctor if no improvement in 5-7 days, sooner if increasing pain, fever, or new symptoms.         Lily Kocher, MD 07/11/11 [phone]

## 2011-07-18 ENCOUNTER — Other Ambulatory Visit: Payer: Self-pay | Admitting: *Deleted

## 2011-07-18 DIAGNOSIS — M533 Sacrococcygeal disorders, not elsewhere classified: Secondary | ICD-10-CM

## 2011-07-18 DIAGNOSIS — M549 Dorsalgia, unspecified: Secondary | ICD-10-CM

## 2011-07-18 MED ORDER — MELOXICAM 7.5 MG PO TABS: 7.5000 mg | ORAL_TABLET | Freq: Every day | ORAL | Status: DC

## 2011-07-20 ENCOUNTER — Other Ambulatory Visit: Payer: Self-pay | Admitting: *Deleted

## 2011-07-20 MED ORDER — PRAVASTATIN SODIUM 80 MG PO TABS: 80.0000 mg | ORAL_TABLET | Freq: Every day | ORAL | Status: DC

## 2011-08-07 ENCOUNTER — Ambulatory Visit: Payer: BC Managed Care – PPO | Admitting: Obstetrics & Gynecology

## 2011-08-08 ENCOUNTER — Ambulatory Visit (INDEPENDENT_AMBULATORY_CARE_PROVIDER_SITE_OTHER): Payer: BC Managed Care – PPO | Admitting: Obstetrics & Gynecology

## 2011-08-08 ENCOUNTER — Encounter: Payer: Self-pay | Admitting: Obstetrics & Gynecology

## 2011-08-08 VITALS — BP 129/81 | HR 72 | Temp 97.6°F | Ht 65.0 in | Wt 248.0 lb

## 2011-08-08 DIAGNOSIS — Z01419 Encounter for gynecological examination (general) (routine) without abnormal findings: Secondary | ICD-10-CM
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
## 2011-08-08 NOTE — Patient Instructions (Signed)
  Place postmenopausal annual exam patient instructions here.  

## 2011-08-09 ENCOUNTER — Encounter (HOSPITAL_COMMUNITY): Payer: Self-pay

## 2011-08-09 ENCOUNTER — Other Ambulatory Visit (HOSPITAL_COMMUNITY): Payer: Self-pay | Admitting: Psychiatry

## 2011-08-09 DIAGNOSIS — F329 Major depressive disorder, single episode, unspecified: Secondary | ICD-10-CM

## 2011-08-14 ENCOUNTER — Encounter (HOSPITAL_COMMUNITY): Payer: Self-pay | Admitting: Psychiatry

## 2011-08-14 ENCOUNTER — Ambulatory Visit (INDEPENDENT_AMBULATORY_CARE_PROVIDER_SITE_OTHER): Payer: BC Managed Care – PPO | Admitting: Psychiatry

## 2011-08-14 VITALS — BP 152/85 | Ht 65.0 in | Wt 249.0 lb

## 2011-08-14 DIAGNOSIS — F3181 Bipolar II disorder: Secondary | ICD-10-CM

## 2011-08-14 DIAGNOSIS — F3189 Other bipolar disorder: Secondary | ICD-10-CM

## 2011-08-14 NOTE — Progress Notes (Signed)
   [facility] Follow-up Outpatient Visit  Sharon Hoover [birth year]   Subjective: The patient is a 56 year old female who has been followed by [facility] since April of 2009. She is currently diagnosed with bipolar affective disorder type II. She remains stable on her current medications of Wellbutrin XL, Lamictal, and Zoloft. The patient has recently started dating. She is rather excited about this. He has a lot of attributes that she's been looking for. She is a little concerned because he does not make a lot of money. 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 She is taking 2 classes this semester. Her last semester she took 2 and made an A and B. The patient endorses good sleep and appetite. Mood is been stable. She's not as overwhelmed as she was at last appointment.  Filed Vitals:   08/14/11 1113  BP: 152/85    Mental Status Examination  Appearance: Casual Alert: Yes Attention: good  Cooperative: Yes Eye Contact: Good Speech: Regular rate rhythm and volume Psychomotor Activity: Normal Memory/Concentration: Intact Oriented: person, place, time/date and situation Mood: Euthymic Affect: Full Range Thought Processes and Associations: Logical Fund of Knowledge: Fair Thought Content: No suicidal or homicidal thoughts Insight: Fair Judgement: Fair  Diagnosis: Bipolar affective disorder type II  Treatment Plan: We will not make any changes. I will see her back in 3 months. Patient to call with concerns.  Jamse Mead, MD

## 2011-08-15 ENCOUNTER — Telehealth: Payer: Self-pay | Admitting: *Deleted

## 2011-08-15 NOTE — Telephone Encounter (Signed)
Received message from Patriciaann Clan @ [facility] that the Comprehensive BRCA 1 and 2 and BART tests were inactivated, per pts request, since she does not meet BCBS of [place] criteria, because both of her affected relatives were diagnosed with breast cancer, over the age of 77.

## 2011-09-04 ENCOUNTER — Encounter: Payer: Self-pay | Admitting: Physician Assistant

## 2011-09-04 ENCOUNTER — Inpatient Hospital Stay: Admission: RE | Admit: 2011-09-04 | Payer: BC Managed Care – PPO | Source: Ambulatory Visit

## 2011-09-04 ENCOUNTER — Ambulatory Visit (INDEPENDENT_AMBULATORY_CARE_PROVIDER_SITE_OTHER): Payer: BC Managed Care – PPO | Admitting: Physician Assistant

## 2011-09-04 VITALS — BP 114/72 | HR 105 | Wt 249.0 lb

## 2011-09-04 DIAGNOSIS — E785 Hyperlipidemia, unspecified: Secondary | ICD-10-CM

## 2011-09-04 DIAGNOSIS — R5383 Other fatigue: Secondary | ICD-10-CM

## 2011-09-04 DIAGNOSIS — R5381 Other malaise: Secondary | ICD-10-CM

## 2011-09-04 DIAGNOSIS — I10 Essential (primary) hypertension: Secondary | ICD-10-CM

## 2011-09-04 DIAGNOSIS — R42 Dizziness and giddiness: Secondary | ICD-10-CM

## 2011-09-04 NOTE — Progress Notes (Signed)
  Subjective:    Patient ID: Sharon Hoover, female    DOB: 1956-01-20, 56 y.o.   MRN: [id number]  HPI Patient presents to clinic with dizziness. She has been experiencing these symptoms for 1 month. She notices it more with changes in position either up to down or down to up. She denies every passing out or losing consciouness. Denies chest pain or palpitations, She has had bilateral ear itching and fullness but no pain. She drinks a lot of sodas. She has not had fever, chills, nausea or vomiting. She has also had some fatigue. She denies any blood loss or problems with thyroid. She has not been exercising. She has been stressed because of re entering the dating scene.    Review of Systems       Objective:   Physical Exam  Constitutional: She is oriented to person, place, and time. She appears well-developed and well-nourished.  HENT:  Head: Normocephalic and atraumatic.  Mouth/Throat: Oropharynx is clear and moist. No oropharyngeal exudate.       Air bubbles behind TM's bilaterally.  Eyes: Conjunctivae are normal.  Neck: Normal range of motion. Neck supple.  Cardiovascular: Regular rhythm and normal heart sounds.        Tachycardia at 105  Pulmonary/Chest: Effort normal and breath sounds normal.  Neurological: She is alert and oriented to person, place, and time.       Negative Dix Hallpike maneuver.  Skin: Skin is warm and dry.  Psychiatric: She has a normal mood and affect. Her behavior is normal.          Assessment & Plan:  Dizziness- Start Zyrtec daily. Stay hydrated. Check blood pressure regularly. Follow up if dizziness continues.  Fatigue- Will check Vit D, TSH, CBC and call with results.   Hyperlipidemia- order Lipid panel. Will call with results.

## 2011-09-04 NOTE — Patient Instructions (Addendum)
Start Zyrtec daily for allergies and may help with dizziness. Stay hydrated.

## 2011-09-07 ENCOUNTER — Other Ambulatory Visit: Payer: Self-pay | Admitting: *Deleted

## 2011-09-07 DIAGNOSIS — M25551 Pain in right hip: Secondary | ICD-10-CM

## 2011-09-07 LAB — CBC WITH DIFFERENTIAL/PLATELET
Basophils Absolute: 0 10*3/uL (ref 0.0–0.1)
Eosinophils Relative: 3 % (ref 0–5)
HCT: 42 % (ref 36.0–46.0)
Lymphocytes Relative: 31 % (ref 12–46)
Lymphs Abs: 2.3 10*3/uL (ref 0.7–4.0)
MCV: 88.2 fL (ref 78.0–100.0)
Monocytes Absolute: 0.8 10*3/uL (ref 0.1–1.0)
Neutro Abs: 4.1 10*3/uL (ref 1.7–7.7)
Platelets: 222 10*3/uL (ref 150–400)
RBC: 4.76 MIL/uL (ref 3.87–5.11)
RDW: 13.3 % (ref 11.5–15.5)
WBC: 7.5 10*3/uL (ref 4.0–10.5)

## 2011-09-07 LAB — LIPID PANEL
Cholesterol: 225 mg/dL — ABNORMAL HIGH (ref 0–200)
HDL: 64 mg/dL (ref >39)
Total CHOL/HDL Ratio: 3.5 Ratio
Triglycerides: 134 mg/dL (ref <150)
VLDL: 27 mg/dL (ref 0–40)

## 2011-09-07 LAB — COMPREHENSIVE METABOLIC PANEL
AST: 25 U/L (ref 0–37)
Alkaline Phosphatase: 106 U/L (ref 39–117)
BUN: 13 mg/dL (ref 6–23)
Creat: 0.85 mg/dL (ref 0.50–1.10)
Total Bilirubin: 0.5 mg/dL (ref 0.3–1.2)

## 2011-09-07 MED ORDER — ACETAMINOPHEN-CODEINE #3 300-30 MG PO TABS: 1.0000 | ORAL_TABLET | Freq: Four times a day (QID) | ORAL | Status: DC | PRN

## 2011-09-09 LAB — VITAMIN D 1,25 DIHYDROXY
Vitamin D 1, 25 (OH)2 Total: 46 pg/mL (ref 18–72)
Vitamin D2 1, 25 (OH)2: <8 pg/mL
Vitamin D3 1, 25 (OH)2: 46 pg/mL

## 2011-09-11 ENCOUNTER — Ambulatory Visit
Admission: RE | Admit: 2011-09-11 | Discharge: 2011-09-11 | Disposition: A | Discharge status: Home / Self Care | Payer: BC Managed Care – PPO | Source: Ambulatory Visit | Attending: Obstetrics & Gynecology | Admitting: Obstetrics & Gynecology

## 2011-09-11 ENCOUNTER — Inpatient Hospital Stay: Admission: RE | Admit: 2011-09-11 | Payer: BC Managed Care – PPO | Source: Ambulatory Visit

## 2011-09-12 MED ORDER — PRAVASTATIN SODIUM 80 MG PO TABS: 80.0000 mg | ORAL_TABLET | Freq: Every day | ORAL | Status: DC

## 2011-09-12 NOTE — Progress Notes (Signed)
Addended by: Ellsworth Lennox on: 09/12/2011 09:00 AM   Modules accepted: Orders

## 2011-09-24 ENCOUNTER — Other Ambulatory Visit: Payer: Self-pay | Admitting: Family Medicine

## 2011-10-29 ENCOUNTER — Other Ambulatory Visit: Payer: Self-pay | Admitting: Family Medicine

## 2011-11-06 ENCOUNTER — Telehealth: Payer: Self-pay | Admitting: *Deleted

## 2011-11-06 MED ORDER — SIMVASTATIN 80 MG PO TABS: 80.0000 mg | ORAL_TABLET | Freq: Every day | ORAL | Status: DC

## 2011-11-06 NOTE — Telephone Encounter (Signed)
Pt states that she was prescribed pravastatin and states that it makes her sick on her stomach. She would like to know if she can go back to the simivastatin. Please advise.

## 2011-11-06 NOTE — Telephone Encounter (Signed)
No problem. I sent in a prescription for simvastatin for 90 day supply to her pharmacy.

## 2011-11-06 NOTE — Telephone Encounter (Signed)
LMOM

## 2011-11-13 ENCOUNTER — Ambulatory Visit (INDEPENDENT_AMBULATORY_CARE_PROVIDER_SITE_OTHER): Payer: BC Managed Care – PPO | Admitting: Psychiatry

## 2011-11-13 ENCOUNTER — Encounter (HOSPITAL_COMMUNITY): Payer: Self-pay | Admitting: Psychiatry

## 2011-11-13 VITALS — BP 128/72 | Ht 65.0 in | Wt 255.0 lb

## 2011-11-13 DIAGNOSIS — F319 Bipolar disorder, unspecified: Secondary | ICD-10-CM

## 2011-11-13 NOTE — Progress Notes (Signed)
    [facility] Follow-up Outpatient Visit  Sharon Hoover 03/25/1956   Subjective: The patient is a 56 year old female who has been followed by [facility] since April of 2009. She is currently diagnosed with bipolar affective disorder type II. She remains stable on her current medications of Wellbutrin XL, Lamictal, and Zoloft. The patient continues to date. Is going well. She is considering quitting her job. She has been approved for financing for her house. She is planning on taking one summer class. She endorses good sleep and appetite. Her weight is a little bit up today. She feels that her mood has been under control. She is taking care of herself.  Filed Vitals:   11/13/11 1408  BP: 128/72    Mental Status Examination  Appearance: Casual Alert: Yes Attention: good  Cooperative: Yes Eye Contact: Good Speech: Regular rate rhythm and volume Psychomotor Activity: Normal Memory/Concentration: Intact Oriented: person, place, time/date and situation Mood: Euthymic Affect: Full Range Thought Processes and Associations: Logical Fund of Knowledge: Fair Thought Content: No suicidal or homicidal thoughts Insight: Fair Judgement: Fair  Diagnosis: Bipolar affective disorder type II  Treatment Plan: We will not make any changes. I will see her back in 3 months. Patient to call with concerns.  Jamse Mead, MD

## 2011-11-15 ENCOUNTER — Other Ambulatory Visit (HOSPITAL_COMMUNITY): Payer: Self-pay | Admitting: Psychiatry

## 2011-11-23 ENCOUNTER — Other Ambulatory Visit: Payer: Self-pay | Admitting: *Deleted

## 2011-11-23 DIAGNOSIS — M25552 Pain in left hip: Secondary | ICD-10-CM

## 2011-11-23 DIAGNOSIS — M25551 Pain in right hip: Secondary | ICD-10-CM

## 2011-11-23 MED ORDER — ACETAMINOPHEN-CODEINE #3 300-30 MG PO TABS: 1.0000 | ORAL_TABLET | Freq: Four times a day (QID) | ORAL | Status: DC | PRN

## 2012-01-14 ENCOUNTER — Encounter: Payer: Self-pay | Admitting: *Deleted

## 2012-01-14 ENCOUNTER — Emergency Department
Admission: EM | Admit: 2012-01-14 | Discharge: 2012-01-14 | Disposition: A | Discharge status: Home / Self Care | Payer: BC Managed Care – PPO | Source: Home / Self Care | Attending: Emergency Medicine | Admitting: Emergency Medicine

## 2012-01-14 DIAGNOSIS — L299 Pruritus, unspecified: Secondary | ICD-10-CM

## 2012-01-14 MED ORDER — PERMETHRIN 5 % EX CREA: TOPICAL_CREAM | CUTANEOUS | Status: AC

## 2012-01-14 MED ORDER — PREDNISONE (PAK) 10 MG PO TABS: 10.0000 mg | ORAL_TABLET | Freq: Every day | ORAL | Status: AC

## 2012-01-14 NOTE — ED Notes (Signed)
Patient c/o itching all over x 3 days. Her boyfriend has been itching x 1 month. About 3 days ago he was seen in the er and diagnosed with ?scabies. He has not filled the treatment yet. She does not recall any new products.

## 2012-01-14 NOTE — ED Provider Notes (Signed)
History     CSN: [id number]  Arrival date & time 01/14/12  1617   First MD Initiated Contact with Patient 01/14/12 1633      Chief Complaint  Patient presents with  . Pruritis    (Consider location/radiation/quality/duration/timing/severity/associated sxs/prior treatment) HPI This patient complains of itching.  Her boyfriend has also been itching for the last month.  He has been on prednisone which helped a little bit and Atarax which also helped a little bit.  He was prescribed Elimite but did not take any.  Nobody else in the house has any itching symptoms.  Location: whole body  Onset: a few days   Course: worsening Self-treated with: Zyrtec             Improvement with treatment: stable  History Itching: yes  Tenderness: no  New medications/antibiotics: no  Pet exposure: no  Recent travel or tropical exposure: no  New soaps, shampoos, detergent, clothing: no  Tick/insect exposure: no   Red Flags Feeling ill: no  Fever: no  Facial/tongue swelling/difficulty breathing:  no  Diabetic or immunocompromised: no    Past Medical History  Diagnosis Date  . Impaired fasting glucose   . Hypertension   . Hyperlipidemia   . Bipolar 2 disorder     pscyh hospitaliztion 2006  . Stress incontinence, female   . Obesity   . DDD (degenerative disc disease), lumbar     uses tens unit  . Hernia     abdominal incisional  . Hypercholesteremia   . Herniated disc     Past Surgical History  Procedure Date  . Total abdominal hysterectomy w/ bilateral salpingoophorectomy 2001    for ovarian cyst  . Cholecystectomy   . Abdominal hysterectomy   . Bladder repair   . Thyroidectomy   . Bladder repair   . Thyroidectomy     Family History  Problem Relation Age of Onset  . Cancer Mother 54    breast  . Other Father     AMI  . Hyperlipidemia Father   . Hypertension Father   . Heart attack Father   . Depression Sister   . Cancer Other 80    breast    History  Substance  Use Topics  . Smoking status: Former Smoker    Quit date: 07/23/1994  . Smokeless tobacco: Not on file  . Alcohol Use: Yes     very rarely    OB History    Grav Para Term Preterm Abortions TAB SAB Ect Mult Living                  Review of Systems  All other systems reviewed and are negative.    Allergies  Erythromycin; Grapefruit diet or; Miconazole nitrate; Other; and Pravastatin  Home Medications   Current Outpatient Rx  Name Route Sig Dispense Refill  . ACETAMINOPHEN-CODEINE #3 300-30 MG PO TABS Oral Take 1 tablet by mouth every 6 (six) hours as needed. For severe pain 50 tablet 0  . BUPROPION HCL ER (XL) 300 MG PO TB24  take 1 tablet by mouth once daily 30 tablet 5  . CYCLOBENZAPRINE HCL 10 MG PO TABS  take 1 tablet by mouth at bedtime 15 tablet 0  . ECONAZOLE NITRATE 1 % EX CREA Topical Apply topically daily. Apply topically daily. 15 g 1  . LAMOTRIGINE 100 MG PO TABS  take 2 tablets by mouth at bedtime 60 tablet 5  . LAMOTRIGINE 200 MG PO TABS Oral Take 200  mg by mouth daily.      . MELOXICAM 7.5 MG PO TABS Oral Take 1 tablet (7.5 mg total) by mouth daily. 30 tablet 2  . PERMETHRIN 5 % EX CREA  Apply to affected area once, can repeat in 1 week 60 g 1  . PREDNISONE (PAK) 10 MG PO TABS Oral Take 1 tablet (10 mg total) by mouth daily. 6 day pack, use as directed, Disp 1 pack 21 tablet 0  . SERTRALINE HCL 100 MG PO TABS  take 1 tablet by mouth once daily 30 tablet 5  . SIMVASTATIN 80 MG PO TABS Oral Take 1 tablet (80 mg total) by mouth at bedtime. 90 tablet 1  . TRIBENZOR 40-10-25 MG PO TABS  take 1 tablet by mouth once daily 30 tablet 3    BP 138/83  Pulse 70  Temp 98.2 F (36.8 C) (Oral)  Resp 14  Ht 5\' 5"  (1.651 m)  Wt 256 lb (116.121 kg)  BMI 42.60 kg/m2  SpO2 98%  Physical Exam  Nursing note and vitals reviewed. Constitutional: She is oriented to person, place, and time. She appears well-developed and well-nourished.  HENT:  Head: Normocephalic and  atraumatic.  Eyes: No scleral icterus.  Neck: Neck supple.  Cardiovascular: Regular rhythm and normal heart sounds.   Pulmonary/Chest: Effort normal and breath sounds normal. No respiratory distress.  Neurological: She is alert and oriented to person, place, and time.  Skin: Skin is warm and dry. No abrasion and no rash noted.       No rash is seen.  No burrows are seen.  There are a few scabs on her extremities that she states that she picks at.    Psychiatric: She has a normal mood and affect. Her speech is normal.    ED Course  Procedures (including critical care time)  Labs Reviewed - No data to display No results found.   1. Pruritic dermatitis       MDM   She is going to take an over-the-counter antihistamine and prednisone first.  It is not helping after a few days she is going to take the, cream and treat for scabies.  At this time I do not see any evidence of scabies it is likely more of an allergic atopic dermatitis.  Can use over-the-counter hydrocortisone cream for localized itching as well.  If not improving will need to followup with PCP or dermatology.  Marlaine Hind, MD 01/14/12 1655

## 2012-02-04 ENCOUNTER — Other Ambulatory Visit: Payer: Self-pay | Admitting: Family Medicine

## 2012-02-05 ENCOUNTER — Other Ambulatory Visit: Payer: Self-pay | Admitting: *Deleted

## 2012-02-05 DIAGNOSIS — M533 Sacrococcygeal disorders, not elsewhere classified: Secondary | ICD-10-CM

## 2012-02-05 DIAGNOSIS — M549 Dorsalgia, unspecified: Secondary | ICD-10-CM

## 2012-02-05 MED ORDER — MELOXICAM 7.5 MG PO TABS: 7.5000 mg | ORAL_TABLET | Freq: Every day | ORAL | Status: DC

## 2012-02-13 ENCOUNTER — Ambulatory Visit (HOSPITAL_COMMUNITY): Payer: Self-pay | Admitting: Psychiatry

## 2012-02-22 ENCOUNTER — Other Ambulatory Visit (HOSPITAL_COMMUNITY): Payer: Self-pay | Admitting: Psychiatry

## 2012-02-28 ENCOUNTER — Encounter (HOSPITAL_COMMUNITY): Payer: Self-pay | Admitting: Psychiatry

## 2012-02-28 ENCOUNTER — Ambulatory Visit (INDEPENDENT_AMBULATORY_CARE_PROVIDER_SITE_OTHER): Payer: BC Managed Care – PPO | Admitting: Psychiatry

## 2012-02-28 VITALS — BP 122/78 | Ht 65.0 in | Wt 253.0 lb

## 2012-02-28 DIAGNOSIS — F3181 Bipolar II disorder: Secondary | ICD-10-CM

## 2012-02-28 DIAGNOSIS — F3189 Other bipolar disorder: Secondary | ICD-10-CM

## 2012-02-28 NOTE — Progress Notes (Signed)
   [facility] Follow-up Outpatient Visit  Lynnlee Revels Belling 1955/12/13   Subjective: The patient is a 56 year old female who has been followed by [facility] since April of 2009. She is currently diagnosed with bipolar affective disorder type II. She remains stable on her current medications of Wellbutrin XL, Lamictal, and Zoloft. Since her last appointment, her boyfriend has moved in. It has been quite the adjustment on her and the boyfriend. The patient dropped the one class she was taking the summer. 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 The patient reports difficulty sleeping with her boyfriend. She snores at night and has been sleeping on the couch. She reports that she needs to be retested for sleep apnea and possibly get a CPAP. Her boyfriend uses one. She endorses good mood. The kids are behaving. She has no concerns at this time.  Filed Vitals:   02/28/12 1530  BP: 122/78    Mental Status Examination  Appearance: Casual Alert: Yes Attention: good  Cooperative: Yes Eye Contact: Good Speech: Regular rate rhythm and volume Psychomotor Activity: Normal Memory/Concentration: Intact Oriented: person, place, time/date and situation Mood: Euthymic Affect: Full Range Thought Processes and Associations: Logical Fund of Knowledge: Fair Thought Content: No suicidal or homicidal thoughts Insight: Fair Judgement: Fair  Diagnosis: Bipolar affective disorder type II  Treatment Plan: We will not make any changes. I will see her back in 3 months. Patient to call with concerns.  Jamse Mead, MD

## 2012-03-04 ENCOUNTER — Other Ambulatory Visit: Payer: Self-pay | Admitting: *Deleted

## 2012-03-04 DIAGNOSIS — M25551 Pain in right hip: Secondary | ICD-10-CM

## 2012-03-04 MED ORDER — ACETAMINOPHEN-CODEINE #3 300-30 MG PO TABS: 1.0000 | ORAL_TABLET | Freq: Four times a day (QID) | ORAL | Status: DC | PRN

## 2012-05-02 ENCOUNTER — Ambulatory Visit (INDEPENDENT_AMBULATORY_CARE_PROVIDER_SITE_OTHER): Payer: BC Managed Care – PPO | Admitting: Physician Assistant

## 2012-05-02 ENCOUNTER — Encounter: Payer: Self-pay | Admitting: Physician Assistant

## 2012-05-02 VITALS — BP 137/63 | HR 58 | Ht 65.0 in | Wt 257.0 lb

## 2012-05-02 DIAGNOSIS — Z131 Encounter for screening for diabetes mellitus: Secondary | ICD-10-CM

## 2012-05-02 DIAGNOSIS — R5383 Other fatigue: Secondary | ICD-10-CM

## 2012-05-02 DIAGNOSIS — E785 Hyperlipidemia, unspecified: Secondary | ICD-10-CM

## 2012-05-02 DIAGNOSIS — I10 Essential (primary) hypertension: Secondary | ICD-10-CM

## 2012-05-02 DIAGNOSIS — R531 Weakness: Secondary | ICD-10-CM

## 2012-05-02 DIAGNOSIS — R5381 Other malaise: Secondary | ICD-10-CM

## 2012-05-02 DIAGNOSIS — R7301 Impaired fasting glucose: Secondary | ICD-10-CM

## 2012-05-02 DIAGNOSIS — H6120 Impacted cerumen, unspecified ear: Secondary | ICD-10-CM

## 2012-05-02 LAB — CBC WITH DIFFERENTIAL/PLATELET
Eosinophils Absolute: 0.2 10*3/uL (ref 0.0–0.7)
Eosinophils Relative: 3 % (ref 0–5)
Lymphs Abs: 2.1 10*3/uL (ref 0.7–4.0)
MCH: 30 pg (ref 26.0–34.0)
MCV: 85.2 fL (ref 78.0–100.0)
Monocytes Absolute: 0.6 10*3/uL (ref 0.1–1.0)
Platelets: 227 10*3/uL (ref 150–400)
RBC: 4.67 MIL/uL (ref 3.87–5.11)
RDW: 13.4 % (ref 11.5–15.5)

## 2012-05-02 LAB — LIPID PANEL
Cholesterol: 162 mg/dL (ref 0–200)
Total CHOL/HDL Ratio: 2.7 Ratio
Triglycerides: 116 mg/dL (ref <150)
VLDL: 23 mg/dL (ref 0–40)

## 2012-05-02 LAB — COMPREHENSIVE METABOLIC PANEL
ALT: 23 U/L (ref 0–35)
AST: 17 U/L (ref 0–37)
Albumin: 4.4 g/dL (ref 3.5–5.2)
Calcium: 9.8 mg/dL (ref 8.4–10.5)
Chloride: 105 mEq/L (ref 96–112)
Potassium: 4.2 mEq/L (ref 3.5–5.3)

## 2012-05-02 MED ORDER — CARBAMIDE PEROXIDE 6.5 % OT SOLN: 5.0000 [drp] | Freq: Two times a day (BID) | OTIC | Status: DC

## 2012-05-02 NOTE — Progress Notes (Signed)
Subjective:    Patient ID: Sharon Hoover, female    DOB: [birth year], 56 y.o.   MRN: [id number]  HPI Patient is a 56 year old female who presents to the clinic to followup on hypertension, hyperlipidemia and to get refills.  Hypertension is ongoing and she is currently taking Tribenzor. Her blood pressure is not currently controlled.she did admit to eating a very salty dinner last night. She also reports that she has not been watching her diet and eating a lot of processed foods. She denies any exercise due to ongoing back pain. Denies chest pain, palpitations, headaches, visual changes.  Hyperlipidemia his ongoing and treated with one half of an 80 mg tablet of Zocor nightly. She has not had lipids checked since February. In February she was not on a statin.   Patient also does report feeling fatigue and weak. When asked when this started she does not know she says she always feels tired just been a little worse lately. She does have a history of bipolar but she feels like her medicines are controlled right now. Her TSH has recently been checked and was normal. She denies any shortness of breath. She has not been sleeping well. She's trying to get adjusted to the CPAP machine for sleep apnea. She's also been staying up late and having to get up early and not getting adequate 8 hours at rest.     Review of Systems     Objective:   Physical Exam  Constitutional: She is oriented to person, place, and time. She appears well-developed and well-nourished.       Obese.  HENT:  Head: Normocephalic and atraumatic.  Left Ear: External ear normal.  Nose: Nose normal.  Mouth/Throat: Oropharynx is clear and moist. No oropharyngeal exudate.       TM not able to be visulized on right side. Left TM clear.   Negative for maxillary sinus pressure to palpation.  Eyes: Conjunctivae normal are normal.  Neck: Normal range of motion. Neck supple.  Cardiovascular: Normal rate, regular rhythm, normal heart  sounds and intact distal pulses.   Pulmonary/Chest: Effort normal and breath sounds normal. She has no wheezes.  Lymphadenopathy:    She has no cervical adenopathy.  Neurological: She is alert and oriented to person, place, and time.  Skin: Skin is warm and dry.  Psychiatric: She has a normal mood and affect. Her behavior is normal.          Assessment & Plan:  HTN- did not make any changes today. Discussed with patient low salt diet and importance of exercise. Pt reports she will monitor BP in next couple of weeks and if not decreasing to low 130's or better will call office and consider med change.   Hyperlipidemia- continue with zocor will recheck lipid panel today. Call with result and/or med changes.   Impaired fasting glucose- It appears she has never had hgA1c done and has had impaired fasting glucose for a while. Ordered today and will call with results.   Fatigue/weak/sleep apnea- reassured patient that she should give the CPAP little more time before she sees is helping or not. It ordered CBC today to make sure she's not anemic. Encouraged patient to go to bed earlier and establish good sleeping habits and routine. She could very likely feels fatigued and weak from not getting adequate 8 hours of rest at night. Encouraged exercise and maintaining a good diet that can help with overall health. Follow up with any acute changes. Did educate  patient that this could be the start of some type of viral illness. She start Celexa coming down some can start taking vitamin C and cold he had to fight off the infection.  Cerumen impaction- Gave Debrox to use for next 4 days. Impaction may resolve on it own or may need to come in for cerumen irrigation. Did not try irrigation today because patient reports that ears have been clogged for a long time.

## 2012-05-02 NOTE — Patient Instructions (Addendum)
Will call with labs.   Airborne and cold ezze if starting to feel bad.

## 2012-05-12 ENCOUNTER — Telehealth: Payer: Self-pay | Admitting: *Deleted

## 2012-05-12 NOTE — Telephone Encounter (Signed)
I have actually never prescribed it but other physicians here have. It is recorded that his is for hip pain? I want to make sure everything else is done before we just mask with pain management. I would like for you to come in and discuss what has been done to help this pain and if numerous things have been done then we could consider pain meds at night. I would want you to sign pain contract before we started prescribing like that. I just want ot make sure if you need surgery or if injections would help then they are done before we put on daily pain meds.   In the meantime take as needed for pain.

## 2012-05-12 NOTE — Telephone Encounter (Signed)
Pt aware and made apt for this Fri

## 2012-05-12 NOTE — Telephone Encounter (Signed)
Pt calls and states that she wakes up a lot during night with her pain and if takes the Tylenol #3 one tab at bedtime she doesn't hurt and sleeps. Pt wants to know if it is ok for her to take one tab every night before she goes to bed? She says she doesn't want to be one of these patients that runs out and creates a problem for you prescribing it for her.

## 2012-05-13 ENCOUNTER — Ambulatory Visit (INDEPENDENT_AMBULATORY_CARE_PROVIDER_SITE_OTHER): Payer: BC Managed Care – PPO | Admitting: Psychiatry

## 2012-05-13 ENCOUNTER — Encounter (HOSPITAL_COMMUNITY): Payer: Self-pay | Admitting: Psychiatry

## 2012-05-13 VITALS — BP 120/74 | Ht 65.0 in | Wt 260.0 lb

## 2012-05-13 DIAGNOSIS — F3181 Bipolar II disorder: Secondary | ICD-10-CM

## 2012-05-13 DIAGNOSIS — F3189 Other bipolar disorder: Secondary | ICD-10-CM

## 2012-05-13 NOTE — Progress Notes (Signed)
   [facility] Follow-up Outpatient Visit  Sharon Hoover [birth year]   Subjective: The patient is a 56 year old female who has been followed by [facility] since April of 2009. She is currently diagnosed with bipolar affective disorder type II. She remains stable on her current medications of Wellbutrin XL, Lamictal, and Zoloft. I did not make any changes in her last appointment. The patient reports that the house is "a little too small" since her boyfriend moved in. One of her daughters, Sharon Hoover, has been more moody. The patient wonders if this is hormonal related. The patient and her ex-husband plan to have a parent meeting with their perspective significant others. This is to discuss parenting and being on the same page. The patient plans to go back to the gym. She is changing her educational program to work development program for which she would receive a certification.  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 The patient was evaluated for sleep apnea and is now wearing a CPAP. She is sleeping in her own bed. Appointment this Friday with her primary care regarding possible pain medication.  Filed Vitals:   05/13/12 1106  BP: 120/74    Mental Status Examination  Appearance: Casual Alert: Yes Attention: good  Cooperative: Yes Eye Contact: Good Speech: Regular rate rhythm and volume Psychomotor Activity: Normal Memory/Concentration: Intact Oriented: person, place, time/date and situation Mood: Euthymic Affect: Full Range Thought Processes and Associations: Logical Fund of Knowledge: Fair Thought Content: No suicidal or homicidal thoughts Insight: Fair Judgement: Fair  Diagnosis: Bipolar affective disorder type II  Treatment Plan: We will not make any changes. I will see her back in 3 months. Patient to call with concerns.  Jamse Mead, MD

## 2012-05-16 ENCOUNTER — Ambulatory Visit (INDEPENDENT_AMBULATORY_CARE_PROVIDER_SITE_OTHER): Payer: BC Managed Care – PPO | Admitting: Physician Assistant

## 2012-05-16 ENCOUNTER — Encounter: Payer: Self-pay | Admitting: Physician Assistant

## 2012-05-16 VITALS — BP 130/78 | HR 88 | Ht 65.0 in | Wt 260.0 lb

## 2012-05-16 DIAGNOSIS — M51379 Other intervertebral disc degeneration, lumbosacral region without mention of lumbar back pain or lower extremity pain: Secondary | ICD-10-CM

## 2012-05-16 DIAGNOSIS — M1990 Unspecified osteoarthritis, unspecified site: Secondary | ICD-10-CM

## 2012-05-16 DIAGNOSIS — M5137 Other intervertebral disc degeneration, lumbosacral region: Secondary | ICD-10-CM

## 2012-05-16 DIAGNOSIS — M4800 Spinal stenosis, site unspecified: Secondary | ICD-10-CM

## 2012-05-16 DIAGNOSIS — I10 Essential (primary) hypertension: Secondary | ICD-10-CM

## 2012-05-16 MED ORDER — MELOXICAM 15 MG PO TABS: 15.0000 mg | ORAL_TABLET | Freq: Every day | ORAL | Status: DC

## 2012-05-16 MED ORDER — TRAMADOL HCL 50 MG PO TABS: ORAL_TABLET | ORAL | Status: DC

## 2012-05-16 NOTE — Progress Notes (Signed)
  Subjective:    Patient ID: Sharon Hoover, female    DOB: 26-Sep-1955, 56 y.o.   MRN: [id number]  HPI Patient is a 56 year old female who presents to the clinic to discuss ongoing pain medication needs. Patient has had a long history of back problems. She has seen Dr. Penni Bombard in the past and has done MRIs and x-rays. She has spinal stenosis along to herniated disc that radiates down both legs. She does not see Dr. Penni Bombard regularly. She is currently on Mobic, Tylenol 3, tramadol, and Flexeril. Dr. Penni Bombard tried her on Neurontin but it made her too sleepy. She does not use Tylenol 3 and tramadol together. She reports that Tylenol 3 works better for back pain and tramadol worse better in the back pain starts radiating down the legs and creates nerve pain. She usually never takes more than 2 pain pills today. The pain is worse when she sits for long periods of time forward she stands for long periods of time. She has tried multiple occasions of physical therapy which have not yielded much benefit. She has also reports that Dr. Penni Bombard does not think that epidural injections would benefit her. Pain is not constant it is usually do to activity. She denies any bowel or bladder dysfunction.    Her BP is elevated today. Patient reports that she was very anxious that her pain medication would be taken away today and that must be she is elevated. I did recheck in office and was 130/70. Patient denies any shortness of breath, chest pain, palpitation. Denies any excess salt intake.   Review of Systems  All other systems reviewed and are negative.       Objective:   Physical Exam  Constitutional: She is oriented to person, place, and time. She appears well-developed and well-nourished.       Obese.  HENT:  Head: Normocephalic and atraumatic.  Cardiovascular: Normal rate, regular rhythm and normal heart sounds.   Pulmonary/Chest: Effort normal and breath sounds normal.  Neurological: She is alert and  oriented to person, place, and time.  Skin: Skin is warm and dry.  Psychiatric: She has a normal mood and affect. Her behavior is normal.          Assessment & Plan:  DDD, herniated discs, spinal stenosis- I did have patient sign pain contract today and copy was given to patient and one will be scanned in. She is aware that I if take over prescribing pain rx then she must alert me of any pain rx that is given from anyone else and she is not permitted to seek pain rx from anyone else. I did agree to give tylenol 3 and tramadol to use for break through pain. She seems to be aware when each medication works better for certain situations. She is aware not to take them at the same time due to them both having tylenol in them. I did increase her mobic to 15mg  daily. Hopefully this will help with pain and not need tylenol 3 and tramadol as much. Follow up as needed. Refilled Tramadol. Does not need Tylenol 3 at this time.   HYpertension- Rechecked and normal range. Patient reports to have been very anxious today. Will continue to monitor.

## 2012-05-16 NOTE — Patient Instructions (Addendum)
Increased Mobic. Continue Tylenol #3 and Tramadol as needed for pain.    Talked to Dr. Christell Constant about Cymbalta for pain.

## 2012-05-29 ENCOUNTER — Ambulatory Visit (HOSPITAL_COMMUNITY): Payer: Self-pay | Admitting: Psychiatry

## 2012-06-23 ENCOUNTER — Other Ambulatory Visit: Payer: Self-pay | Admitting: *Deleted

## 2012-06-23 MED ORDER — OLMESARTAN-AMLODIPINE-HCTZ 40-10-25 MG PO TABS: 1.0000 | ORAL_TABLET | Freq: Every day | ORAL | Status: DC

## 2012-06-23 NOTE — Addendum Note (Signed)
Addended by: Ellsworth Lennox on: 06/23/2012 11:07 AM   Modules accepted: Orders

## 2012-07-14 ENCOUNTER — Ambulatory Visit (INDEPENDENT_AMBULATORY_CARE_PROVIDER_SITE_OTHER): Payer: BC Managed Care – PPO

## 2012-07-14 ENCOUNTER — Encounter: Payer: Self-pay | Admitting: Physician Assistant

## 2012-07-14 ENCOUNTER — Ambulatory Visit (INDEPENDENT_AMBULATORY_CARE_PROVIDER_SITE_OTHER): Payer: BC Managed Care – PPO | Admitting: Physician Assistant

## 2012-07-14 VITALS — BP 141/75 | HR 74 | Ht 65.0 in | Wt 264.0 lb

## 2012-07-14 DIAGNOSIS — M79671 Pain in right foot: Secondary | ICD-10-CM

## 2012-07-14 DIAGNOSIS — S92919A Unspecified fracture of unspecified toe(s), initial encounter for closed fracture: Secondary | ICD-10-CM

## 2012-07-14 DIAGNOSIS — M79609 Pain in unspecified limb: Secondary | ICD-10-CM

## 2012-07-14 DIAGNOSIS — T1490XA Injury, unspecified, initial encounter: Secondary | ICD-10-CM

## 2012-07-14 DIAGNOSIS — S8990XA Unspecified injury of unspecified lower leg, initial encounter: Secondary | ICD-10-CM

## 2012-07-14 DIAGNOSIS — M25559 Pain in unspecified hip: Secondary | ICD-10-CM

## 2012-07-14 DIAGNOSIS — S99919A Unspecified injury of unspecified ankle, initial encounter: Secondary | ICD-10-CM

## 2012-07-14 DIAGNOSIS — IMO0002 Reserved for concepts with insufficient information to code with codable children: Secondary | ICD-10-CM

## 2012-07-14 DIAGNOSIS — M25551 Pain in right hip: Secondary | ICD-10-CM

## 2012-07-14 MED ORDER — ACETAMINOPHEN-CODEINE #3 300-30 MG PO TABS: 1.0000 | ORAL_TABLET | Freq: Four times a day (QID) | ORAL | Status: DC | PRN

## 2012-07-14 MED ORDER — MELOXICAM 15 MG PO TABS: 15.0000 mg | ORAL_TABLET | Freq: Every day | ORAL | Status: DC

## 2012-07-14 NOTE — Patient Instructions (Addendum)
Gave post op shoe. 4-6 weeks of post op boot.   REST, ICE, Elevation  Refilled Tylenol 3 and Mobic

## 2012-07-14 NOTE — Progress Notes (Signed)
  Subjective:    Patient ID: Sharon Hoover, female    DOB: 1955-11-22, 56 y.o.   MRN: [id number]  HPI Patient is a 56 year old female who presents to the clinic with a right fifth toe injury. Injury occurred on Friday, December 20 after catching the side of a chair. The area where the trauma occurred has now started to bruise it is extremely painful to walk or put any pressure on that area. She has occasionally used her Tylenol 3 once or twice and been using Mobic every day. She still rates the pain a 7/10 when walking and at rest around a 3/10. She has elevated the right foot at night and iced it once or twice. Has helped some.    Review of Systems     Objective:   Physical Exam  Constitutional: She appears well-developed and well-nourished.  HENT:  Head: Normocephalic and atraumatic.  Musculoskeletal:       Significant bruising over right foot fifth toe up into dorsal foot. Pinpoint tenderness over base of proximal phalanx and up into foot. Mild swelling over the affected area. Range of motion at ankles full and complete without pain.  Psychiatric: She has a normal mood and affect. Her behavior is normal.          Assessment & Plan:  Fracture of proximal phalanx of 5th toe/chronic hip pain- x-ray revealed fracture. Reviewed x-ray with patient in office. Place patient in a postop shoe to immobilize fracture. Discussed symptomatic treatment of fracture staying in boot for 4-6 weeks until bone heals completely. Elevating the at night. Icing affected area during the day and at night to decrease swelling and pain. Patient currently has prescription for Tylenol 3 for chronic hip pain went ahead and refilled today and told patient she can also use as needed for toe pain. Also refill patient's Mobic to take daily for inflammation. If not improving in 4-6 weeks and able to bear full weight he to followup in office otherwise her need for repeat x-ray of toe fracture. Blood pressure is elevated  today and discuss with patient most likely due to pain. We'll continue to monitor and is staying above 140/90 need to consider additional treatment.

## 2012-08-13 ENCOUNTER — Ambulatory Visit (INDEPENDENT_AMBULATORY_CARE_PROVIDER_SITE_OTHER): Payer: BC Managed Care – PPO | Admitting: Psychiatry

## 2012-08-13 ENCOUNTER — Encounter (HOSPITAL_COMMUNITY): Payer: Self-pay | Admitting: Psychiatry

## 2012-08-13 ENCOUNTER — Other Ambulatory Visit: Payer: Self-pay | Admitting: *Deleted

## 2012-08-13 VITALS — BP 124/78 | Ht 65.0 in | Wt 268.0 lb

## 2012-08-13 DIAGNOSIS — F3189 Other bipolar disorder: Secondary | ICD-10-CM

## 2012-08-13 DIAGNOSIS — F3181 Bipolar II disorder: Secondary | ICD-10-CM

## 2012-08-13 DIAGNOSIS — M792 Neuralgia and neuritis, unspecified: Secondary | ICD-10-CM

## 2012-08-13 MED ORDER — TRAMADOL HCL 50 MG PO TABS: ORAL_TABLET | ORAL | Status: DC

## 2012-08-13 NOTE — Progress Notes (Signed)
   [facility] Follow-up Outpatient Visit  Sharon Hoover [birth year]   Subjective: The patient is a 57 year old female who has been followed by [facility] since April of 2009. She is currently diagnosed with bipolar affective disorder type II. She remains stable on her current medications of Wellbutrin XL, Lamictal, and Zoloft. I did not make any changes in her last appointment. The patient has been having issues with finances. Her boss has let her go. 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 She would've done it earlier, but her alone has not gone through for her home. She is going to contact her previous lender. The patient had upon 3 of her ring secondary to her oldest daughter's tuition. The patient continues in school and is taking 2 classes online. She broke her toe last month, and has been in a boot. The patient feels that if she makes 1 sale per month, she will be able to maintain a livelihood. She has not gotten back to the gym. She is up 8 pounds today. She is not happy about this. She feels her medications continue to work. She has no other complaints today.  Filed Vitals:   08/13/12 1258  BP: 124/78    Mental Status Examination  Appearance: Casual Alert: Yes Attention: good  Cooperative: Yes Eye Contact: Good Speech: Regular rate rhythm and volume Psychomotor Activity: Normal Memory/Concentration: Intact Oriented: person, place, time/date and situation Mood: Euthymic Affect: Full Range Thought Processes and Associations: Logical Fund of Knowledge: Fair Thought Content: No suicidal or homicidal thoughts Insight: Fair Judgement: Fair  Diagnosis: Bipolar affective disorder type II  Treatment Plan: We will not make any changes. I will see her back in 3 months. Patient to call with concerns.  Jamse Mead, MD

## 2012-09-08 ENCOUNTER — Other Ambulatory Visit (HOSPITAL_COMMUNITY): Payer: Self-pay | Admitting: Psychiatry

## 2012-09-10 ENCOUNTER — Telehealth: Payer: Self-pay

## 2012-09-10 DIAGNOSIS — M25551 Pain in right hip: Secondary | ICD-10-CM

## 2012-09-10 MED ORDER — ACETAMINOPHEN-CODEINE #3 300-30 MG PO TABS: 1.0000 | ORAL_TABLET | Freq: Four times a day (QID) | ORAL | Status: DC | PRN

## 2012-09-10 NOTE — Telephone Encounter (Signed)
Ok for refill. She filled out a pain contract. I do not see in chart but not sure I am looking the right places. i printed and needs to be faxed.

## 2012-09-10 NOTE — Telephone Encounter (Signed)
Fax refill request for Tylenol-Cod #3   After review of her chart I'm not sure this mediation was intended for long term use. Is this refill appropriate?

## 2012-09-19 ENCOUNTER — Other Ambulatory Visit: Payer: Self-pay | Admitting: Physician Assistant

## 2012-09-19 DIAGNOSIS — Z1231 Encounter for screening mammogram for malignant neoplasm of breast: Secondary | ICD-10-CM

## 2012-09-22 ENCOUNTER — Encounter: Payer: Self-pay | Admitting: Physician Assistant

## 2012-09-22 ENCOUNTER — Ambulatory Visit (INDEPENDENT_AMBULATORY_CARE_PROVIDER_SITE_OTHER): Payer: BC Managed Care – PPO | Admitting: Physician Assistant

## 2012-09-22 VITALS — BP 148/72 | HR 66 | Wt 269.0 lb

## 2012-09-22 DIAGNOSIS — R609 Edema, unspecified: Secondary | ICD-10-CM

## 2012-09-22 DIAGNOSIS — Z79899 Other long term (current) drug therapy: Secondary | ICD-10-CM

## 2012-09-22 MED ORDER — AMOXICILLIN-POT CLAVULANATE 875-125 MG PO TABS: 1.0000 | ORAL_TABLET | Freq: Two times a day (BID) | ORAL | Status: DC

## 2012-09-22 MED ORDER — TRIAMCINOLONE 0.1 % CREAM:EUCERIN CREAM 1:1: 1.0000 "application " | TOPICAL_CREAM | Freq: Two times a day (BID) | CUTANEOUS | Status: DC

## 2012-09-22 MED ORDER — FUROSEMIDE 40 MG PO TABS: 40.0000 mg | ORAL_TABLET | Freq: Every day | ORAL | Status: DC

## 2012-09-22 NOTE — Progress Notes (Signed)
  Subjective:    Patient ID: Sharon Hoover, female    DOB: [birth year], 57 y.o.   MRN: [id number]  HPI Patient presents to the clinic with lower leg edema. She has a history of leg/ankle swelling. It has been much worse over the last 2 weeks. Her recliner did break and she is used to putting her feet up every night after work. 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 Bilateral swelling. She denies any leg pain, brusing. She has no SOB or chest pain. Her legs are very itching bilateally and seem to have some erythema. No recent immobilzation or surgery.    Has had sinus pressure for last 2 weeks. Feels very congested and bilateral ear pain. Denies fever, ST, muscle aches. Tried Mucinex D and helped some. Cough is dry and not productive. Denies any wheezing.    Review of Systems     Objective:   Physical Exam  Constitutional: She is oriented to person, place, and time. She appears well-developed and well-nourished.  HENT:  Head: Normocephalic and atraumatic.  Right Ear: External ear normal.  Left Ear: External ear normal.  Nose: Nose normal.  Mouth/Throat: Oropharynx is clear and moist. No oropharyngeal exudate.  Eyes: Conjunctivae are normal.  Neck: Normal range of motion. Neck supple.  Cardiovascular: Normal rate, regular rhythm and normal heart sounds.   Pulmonary/Chest: Effort normal and breath sounds normal. She has no wheezes.  Lymphadenopathy:    She has no cervical adenopathy.  Neurological: She is alert and oriented to person, place, and time.  Good pedal pulses 2+ bilaterally.  Skin:  Edema bilaterally non-pitting around ankle and up legs. Erythemaous tiny papules going up leg bilaterally.    Psychiatric: She has a normal mood and affect. Her behavior is normal.          Assessment & Plan:  Lower extremity edema/itching- will check renal function. Gave lasix to use as needed for swelling. Discussed compression stockings daily, elevation at night. Gave  cream to try on legs to see if helps with itchiness. Follow up if not improving. Will continue to monitor BP. Took decongestants may have increased BP.   Sinusitis- stop decongestants. Start augementin. Gave handout. Call if not improving.

## 2012-09-22 NOTE — Patient Instructions (Addendum)
Compression stockings and elevation of legs as much as needed.      Peripheral Edema You have swelling in your legs (peripheral edema). This swelling is due to excess accumulation of salt and water in your body. Edema may be a sign of heart, kidney or liver disease, or a side effect of a medication. It may also be due to problems in the leg veins. Elevating your legs and using special support stockings may be very helpful, if the cause of the swelling is due to poor venous circulation. Avoid long periods of standing, whatever the cause. Treatment of edema depends on identifying the cause. Chips, pretzels, pickles and other salty foods should be avoided. Restricting salt in your diet is almost always needed. Water pills (diuretics) are often used to remove the excess salt and water from your body via urine. These medicines prevent the kidney from reabsorbing sodium. This increases urine flow. Diuretic treatment may also result in lowering of potassium levels in your body. Potassium supplements may be needed if you have to use diuretics daily. Daily weights can help you keep track of your progress in clearing your edema. You should call your caregiver for follow up care as recommended. SEEK IMMEDIATE MEDICAL CARE IF:   You have increased swelling, pain, redness, or heat in your legs.  You develop shortness of breath, especially when lying down.  You develop chest or abdominal pain, weakness, or fainting.  You have a fever. Document Released: 08/16/2004 Document Revised: 10/01/2011 Document Reviewed: 07/27/2009 [facility] Patient Information 2013 [place], [place].   Lasix only as needed. Call if not improving or worsening. Decrease salt.   Follow up in 4 weeks.

## 2012-09-23 LAB — BASIC METABOLIC PANEL WITH GFR
BUN: 17 mg/dL (ref 6–23)
CO2: 28 mEq/L (ref 19–32)
Chloride: 104 mEq/L (ref 96–112)
GFR, Est Non African American: 86 mL/min
Potassium: 4.1 mEq/L (ref 3.5–5.3)

## 2012-09-24 ENCOUNTER — Other Ambulatory Visit (HOSPITAL_COMMUNITY): Payer: Self-pay | Admitting: Psychiatry

## 2012-09-25 ENCOUNTER — Ambulatory Visit: Payer: Self-pay

## 2012-09-30 ENCOUNTER — Ambulatory Visit: Payer: Self-pay

## 2012-10-06 ENCOUNTER — Other Ambulatory Visit: Payer: Self-pay | Admitting: *Deleted

## 2012-10-06 MED ORDER — MELOXICAM 15 MG PO TABS: 15.0000 mg | ORAL_TABLET | Freq: Every day | ORAL | Status: DC

## 2012-10-22 ENCOUNTER — Telehealth: Payer: Self-pay | Admitting: *Deleted

## 2012-10-22 NOTE — Telephone Encounter (Signed)
Pt calls & asks if she can start a 5 week diet of no starch, oil, or sugar? Please advise

## 2012-10-22 NOTE — Telephone Encounter (Signed)
Yes, ok 

## 2012-10-22 NOTE — Telephone Encounter (Signed)
Pt.notified

## 2012-11-04 ENCOUNTER — Ambulatory Visit (INDEPENDENT_AMBULATORY_CARE_PROVIDER_SITE_OTHER): Payer: BC Managed Care – PPO

## 2012-11-04 DIAGNOSIS — Z1231 Encounter for screening mammogram for malignant neoplasm of breast: Secondary | ICD-10-CM

## 2012-11-05 ENCOUNTER — Emergency Department (INDEPENDENT_AMBULATORY_CARE_PROVIDER_SITE_OTHER): Payer: BC Managed Care – PPO

## 2012-11-05 ENCOUNTER — Encounter: Payer: Self-pay | Admitting: *Deleted

## 2012-11-05 ENCOUNTER — Emergency Department (INDEPENDENT_AMBULATORY_CARE_PROVIDER_SITE_OTHER)
Admission: EM | Admit: 2012-11-05 | Discharge: 2012-11-05 | Disposition: A | Discharge status: Home / Self Care | Payer: BC Managed Care – PPO | Source: Home / Self Care | Attending: Family Medicine | Admitting: Family Medicine

## 2012-11-05 DIAGNOSIS — W19XXXA Unspecified fall, initial encounter: Secondary | ICD-10-CM

## 2012-11-05 DIAGNOSIS — S20219A Contusion of unspecified front wall of thorax, initial encounter: Secondary | ICD-10-CM

## 2012-11-05 DIAGNOSIS — S29012A Strain of muscle and tendon of back wall of thorax, initial encounter: Secondary | ICD-10-CM

## 2012-11-05 DIAGNOSIS — S239XXA Sprain of unspecified parts of thorax, initial encounter: Secondary | ICD-10-CM

## 2012-11-05 DIAGNOSIS — S20211A Contusion of right front wall of thorax, initial encounter: Secondary | ICD-10-CM

## 2012-11-05 DIAGNOSIS — R079 Chest pain, unspecified: Secondary | ICD-10-CM

## 2012-11-05 DIAGNOSIS — R209 Unspecified disturbances of skin sensation: Secondary | ICD-10-CM

## 2012-11-05 NOTE — ED Notes (Addendum)
Sharon Hoover reports tripping and falling in the back parking lot of the [name] today landing prone. She reports right sided chest pain, pain is worse with movement. Denies SOB

## 2012-11-05 NOTE — ED Provider Notes (Signed)
History     CSN: [id number]  Arrival date & time 11/05/12  1339   First MD Initiated Contact with Patient 11/05/12 1454      Chief Complaint  Patient presents with  . Chest Injury      HPI Comments: Patient tripped in parking lot and fell forward, striking her right arm and right anterior chest on pavement.  No loss of consciousness, and no other injury.  She complains of pain in her right chest, and has pain in her right ribs when she tries to abduct her right shoulder.  No shortness of breath, and no pleuritic pain.  Patient is a 57 y.o. female presenting with chest pain. The history is provided by the patient.  Chest Pain Pain location:  R lateral chest Pain quality: sharp   Pain quality: not radiating   Pain radiates to:  Does not radiate Pain severity:  Moderate Onset quality:  Sudden Timing:  Constant Progression:  Unchanged Chronicity:  New Context: raising an arm and trauma   Relieved by:  None tried Worsened by:  Certain positions and deep breathing Associated symptoms: back pain   Associated symptoms: no abdominal pain, no altered mental status, no cough, no dizziness, no headache, no nausea, no numbness, no palpitations, no shortness of breath and no syncope   Risk factors: obesity     Past Medical History  Diagnosis Date  . Impaired fasting glucose   . Hypertension   . Hyperlipidemia   . Bipolar 2 disorder     pscyh hospitaliztion 2006  . Stress incontinence, female   . Obesity   . DDD (degenerative disc disease), lumbar     uses tens unit  . Hernia     abdominal incisional  . Hypercholesteremia   . Herniated disc     Past Surgical History  Procedure Laterality Date  . Total abdominal hysterectomy w/ bilateral salpingoophorectomy  2001    for ovarian cyst  . Cholecystectomy    . Abdominal hysterectomy    . Bladder repair    . Thyroidectomy    . Bladder repair    . Thyroidectomy      Family History  Problem Relation Age of Onset  . Cancer  Mother 73    breast  . Other Father     AMI  . Hyperlipidemia Father   . Hypertension Father   . Heart attack Father   . Depression Sister   . Cancer Other 80    breast    History  Substance Use Topics  . Smoking status: Former Smoker    Quit date: 07/23/1994  . Smokeless tobacco: Not on file  . Alcohol Use: Yes     Comment: very rarely    OB History   Grav Para Term Preterm Abortions TAB SAB Ect Mult Living                  Review of Systems  Respiratory: Negative for cough and shortness of breath.   Cardiovascular: Positive for chest pain. Negative for palpitations and syncope.  Gastrointestinal: Negative for nausea and abdominal pain.  Musculoskeletal: Positive for back pain.  Neurological: Negative for dizziness, numbness and headaches.  Psychiatric/Behavioral: Negative for altered mental status.  All other systems reviewed and are negative.    Allergies  Erythromycin; Grapefruit diet or; Miconazole nitrate; Other; and Pravastatin  Home Medications   Current Outpatient Rx  Name  Route  Sig  Dispense  Refill  . acetaminophen-codeine (TYLENOL #3) 300-30 MG per  tablet   Oral   Take 1 tablet by mouth every 6 (six) hours as needed. For severe pain   50 tablet   1   . amoxicillin-clavulanate (AUGMENTIN) 875-125 MG per tablet   Oral   Take 1 tablet by mouth 2 (two) times daily. For 10 days.   20 tablet   0   . buPROPion (WELLBUTRIN XL) 300 MG 24 hr tablet      take 1 tablet by mouth once daily   30 tablet   5   . econazole nitrate 1 % cream   Topical   Apply topically daily. Apply topically daily.   15 g   1   . furosemide (LASIX) 40 MG tablet   Oral   Take 1 tablet (40 mg total) by mouth daily. As needed for lower extremity edema.   30 tablet   1   . lamoTRIgine (LAMICTAL) 200 MG tablet   Oral   Take 200 mg by mouth daily.           . meloxicam (MOBIC) 15 MG tablet   Oral   Take 1 tablet (15 mg total) by mouth daily.   30 tablet   1    . Olmesartan-Amlodipine-HCTZ (TRIBENZOR) 40-10-25 MG TABS   Oral   Take 1 tablet by mouth daily.   30 tablet   3   . sertraline (ZOLOFT) 100 MG tablet      take 1 tablet by mouth once daily   30 tablet   5   . simvastatin (ZOCOR) 80 MG tablet   Oral   Take 1 tablet (80 mg total) by mouth at bedtime.   90 tablet   1   . traMADol (ULTRAM) 50 MG tablet      Take every 8hr as needed for nerve pain.   30 tablet   1   . Triamcinolone Acetonide (TRIAMCINOLONE 0.1 % CREAM : EUCERIN) CREA   Topical   Apply 1 application topically 2 (two) times daily.   1 each   0     One to one. 1 jar.     BP 133/81  Pulse 83  Resp 18  Wt 268 lb (121.564 kg)  BMI 44.6 kg/m2  SpO2 98%  Physical Exam  Nursing note and vitals reviewed. Constitutional: She is oriented to person, place, and time. She appears well-developed and well-nourished. No distress.  Patient is obese (BMI 44.6)  HENT:  Head: Normocephalic and atraumatic.  Right Ear: External ear normal.  Left Ear: External ear normal.  Nose: Nose normal.  Mouth/Throat: Oropharynx is clear and moist.  Eyes: Conjunctivae and EOM are normal. Pupils are equal, round, and reactive to light.  Neck: Normal range of motion. Neck supple.  Cardiovascular: Normal rate, regular rhythm and normal heart sounds.   Pulmonary/Chest: Effort normal and breath sounds normal. No respiratory distress.      There is tenderness along medial and inferior edge of right scapula.  There is also tenderness over lateral and anterior ribs as shown in blue on diagram.  Patient has difficulty abducting right arm above horizontal but rib pain is limiting factor rather than shoulder.  Abdominal: Soft. There is no tenderness.  Musculoskeletal: She exhibits tenderness.       Right shoulder: She exhibits decreased range of motion and decreased strength. She exhibits no tenderness, no bony tenderness, no swelling, no effusion, no crepitus, no deformity and normal  pulse.  Neurological: She is alert and oriented to person, place, and  time. No cranial nerve deficit.  Skin: Skin is warm and dry.    ED Course  Procedures  none   Dg Ribs Unilateral W/chest Right  11/05/2012  *RADIOLOGY REPORT*  Clinical Data: Larey Seat in parking lot with pain and tenderness in the right anterolateral ribs, former smoking history  RIGHT RIBS AND CHEST - 3+ VIEW  Comparison: Chest x-ray of 06/22/2008  Findings: The lungs are clear.  Calcification overlying the right upper lung near the apex is unchanged and probably is vascular in origin.  No lung nodule is seen and no effusion is noted. Mediastinal contours appear stable.  The heart is within upper limits of normal.  Right rib detail films are somewhat light in technique, but no acute fracture is seen.  IMPRESSION:  1.  Negative right rib detail films. The films are somewhat light in technique. 2.  No active lung disease.  No pneumothorax.   Original Report Authenticated By: Dwyane Dee, M.D.          1. Contusion of ribs, right, initial encounter   2. Rhomboid muscle strain, initial encounter       MDM  Rib belt applied. Apply ice pack for 30 to 45 minutes every 1 to 4 hours.  Continue for about two days.  Continue Mobic.  Wear rib belt for about one week.  Begin rhomboid range of motion exercises in about 5 days ([facility] information and instruction handout given)  Followup with [facility] if not improving about two weeks.         Lattie Haw, MD 11/05/12 [phone]

## 2012-11-11 ENCOUNTER — Other Ambulatory Visit: Payer: Self-pay | Admitting: Physician Assistant

## 2012-11-12 ENCOUNTER — Ambulatory Visit (INDEPENDENT_AMBULATORY_CARE_PROVIDER_SITE_OTHER): Payer: BC Managed Care – PPO | Admitting: Psychiatry

## 2012-11-12 ENCOUNTER — Encounter (HOSPITAL_COMMUNITY): Payer: Self-pay | Admitting: Psychiatry

## 2012-11-12 VITALS — BP 138/82 | Ht 65.0 in | Wt 270.0 lb

## 2012-11-12 DIAGNOSIS — F3189 Other bipolar disorder: Secondary | ICD-10-CM

## 2012-11-12 DIAGNOSIS — F3181 Bipolar II disorder: Secondary | ICD-10-CM

## 2012-11-12 NOTE — Progress Notes (Signed)
[facility] Follow-up Outpatient Visit  Sharon Hoover   Subjective: The patient is a 57 year old female who has been followed by [facility] since April of 2009. She is currently diagnosed with bipolar affective disorder type II. She remains stable on her current medications of Wellbutrin XL, Lamictal, and Zoloft. I did not make any changes in her last appointment. She presents today. She closed on her very first house today. She is proud of her self. Financially, she still in bad shape. Her boyfriend continues to live in the house. As soon as he starts working more and is able to get his car inspected, he will be moving out. She reports they have not had sex in over a year. They are more like roommates. The patient endorses good sleep and appetite. She has had some edema recently. Her boyfriend broke her lazy boy and she can no longer elevate her feet. Her youngest child is giving her issues. I'm seeing him today also. The patient feels her medications are keeping her stable. There is no depression or anxiety. A lot of her stress is situational.  Filed Vitals:   11/12/12 1546  BP: 138/82   Active Ambulatory Problems    Diagnosis Date Noted  . THYROID MASS 07/08/2009  . HYPERCHOLESTEROLEMIA 07/08/2007  . BIPOLAR DISORDER UNSPECIFIED 07/08/2007  . ADJUSTMENT DISORDER WITH DEPRESSED MOOD 08/18/2008  . ESSENTIAL HYPERTENSION, BENIGN 07/08/2007  . ALLERGIC RHINITIS 03/22/2010  . BRONCHITIS, ALLERGIC 05/31/2010  . ASTHMA UNSPECIFIED WITH EXACERBATION 08/18/2008  . IBS 06/07/2008  . FEMALE STRESS INCONTINENCE 07/08/2007  . ACNE VULGARIS 06/07/2008  . DISC DISEASE, LUMBAR 07/08/2007  . CERVICALGIA 11/04/2009  . TRIGGER FINGER 04/01/2008  . CHEST WALL PAIN, ACUTE 06/22/2008  . IMPAIRED FASTING GLUCOSE 01/23/2008  . MUSCLE STRAIN, HAMSTRING MUSCLE 03/22/2010  . NECK SPASM 09/02/2008  . BACK STRAIN 11/04/2009  . DIZZINESS 08/18/2010  . CONTUSION,  HEAD 08/18/2010  . NUMBNESS, ARM 09/27/2010  . Obesity, Class III, BMI 40-49.9 (morbid obesity) 11/15/2010  . Shoulder bursitis 11/15/2010  . Hip pain 11/30/2010   Resolved Ambulatory Problems    Diagnosis Date Noted  . Acute bronchitis 01/02/2008  . CELLULITIS 11/09/2009  . TOE PAIN 12/22/2008  . Wheezing 09/07/2008  . FRACTURE, FINGER, DISTAL 07/08/2009  . FRACTURE, TOE 12/23/2008   Past Medical History  Diagnosis Date  . Hypertension   . Hyperlipidemia   . Bipolar 2 disorder   . Stress incontinence, female   . Obesity   . DDD (degenerative disc disease), lumbar   . Hernia   . Hypercholesteremia   . Herniated disc    Current Outpatient Prescriptions on File Prior to Visit  Medication Sig Dispense Refill  . acetaminophen-codeine (TYLENOL #3) 300-30 MG per tablet Take 1 tablet by mouth every 6 (six) hours as needed. For severe pain  50 tablet  1  . amoxicillin-clavulanate (AUGMENTIN) 875-125 MG per tablet Take 1 tablet by mouth 2 (two) times daily. For 10 days.  20 tablet  0  . buPROPion (WELLBUTRIN XL) 300 MG 24 hr tablet take 1 tablet by mouth once daily  30 tablet  5  . econazole nitrate 1 % cream Apply topically daily. Apply topically daily.  15 g  1  . furosemide (LASIX) 40 MG tablet Take 1 tablet (40 mg total) by mouth daily. As needed for lower extremity edema.  30 tablet  1  . lamoTRIgine (LAMICTAL) 200 MG tablet Take 200 mg by mouth daily.        Marland Kitchen  meloxicam (MOBIC) 15 MG tablet Take 1 tablet (15 mg total) by mouth daily.  30 tablet  1  . sertraline (ZOLOFT) 100 MG tablet take 1 tablet by mouth once daily  30 tablet  5  . simvastatin (ZOCOR) 80 MG tablet Take 1 tablet (80 mg total) by mouth at bedtime.  90 tablet  1  . traMADol (ULTRAM) 50 MG tablet Take every 8hr as needed for nerve pain.  30 tablet  1  . Triamcinolone Acetonide (TRIAMCINOLONE 0.1 % CREAM : EUCERIN) CREA Apply 1 application topically 2 (two) times daily.  1 each  0  . TRIBENZOR 40-10-25 MG TABS take  1 tablet by mouth once daily  30 tablet  3   No current facility-administered medications on file prior to visit.   Review of Systems - General ROS: negative for - sleep disturbance or weight loss Psychological ROS: positive for - anxiety Cardiovascular ROS: no chest pain or dyspnea on exertion Musculoskeletal ROS: negative for - gait disturbance or muscular weakness Neurological ROS: negative for - headaches or seizures  Mental Status Examination  Appearance: Casual Alert: Yes Attention: good  Cooperative: Yes Eye Contact: Good Speech: Regular rate rhythm and volume Psychomotor Activity: Normal Memory/Concentration: Intact Oriented: person, place, time/date and situation Mood: Euthymic Affect: Full Range Thought Processes and Associations: Logical Fund of Knowledge: Fair Thought Content: No suicidal or homicidal thoughts Insight: Fair Judgement: Fair  Diagnosis: Bipolar affective disorder type II  Treatment Plan: I will not make any changes.  I will continue the Wellbutrin XL, Lamictal, and Zoloft.  I will see her back in 4 months. Patient to call with concerns.  Jamse Mead, MD

## 2012-11-14 ENCOUNTER — Encounter: Payer: Self-pay | Admitting: Emergency Medicine

## 2012-11-14 ENCOUNTER — Emergency Department (INDEPENDENT_AMBULATORY_CARE_PROVIDER_SITE_OTHER): Payer: BC Managed Care – PPO

## 2012-11-14 ENCOUNTER — Emergency Department
Admission: EM | Admit: 2012-11-14 | Discharge: 2012-11-14 | Disposition: A | Discharge status: Home / Self Care | Payer: BC Managed Care – PPO | Source: Home / Self Care | Attending: Family Medicine | Admitting: Family Medicine

## 2012-11-14 DIAGNOSIS — S20219A Contusion of unspecified front wall of thorax, initial encounter: Secondary | ICD-10-CM

## 2012-11-14 DIAGNOSIS — S20211D Contusion of right front wall of thorax, subsequent encounter: Secondary | ICD-10-CM

## 2012-11-14 DIAGNOSIS — S239XXA Sprain of unspecified parts of thorax, initial encounter: Secondary | ICD-10-CM

## 2012-11-14 DIAGNOSIS — R079 Chest pain, unspecified: Secondary | ICD-10-CM

## 2012-11-14 DIAGNOSIS — M549 Dorsalgia, unspecified: Secondary | ICD-10-CM

## 2012-11-14 DIAGNOSIS — S29012D Strain of muscle and tendon of back wall of thorax, subsequent encounter: Secondary | ICD-10-CM

## 2012-11-14 MED ORDER — TRAMADOL HCL 50 MG PO TABS: 50.0000 mg | ORAL_TABLET | Freq: Four times a day (QID) | ORAL | Status: DC | PRN

## 2012-11-14 MED ORDER — LIDOCAINE 5 % EX PTCH: 1.0000 | MEDICATED_PATCH | CUTANEOUS | Status: DC

## 2012-11-14 NOTE — ED Provider Notes (Signed)
History     CSN: [id number]  Arrival date & time 11/14/12  1502   First MD Initiated Contact with Patient 11/14/12 1504      Chief Complaint  Patient presents with  . Rib Injury   HPI  Patient presents today for followup of back and rib pain. Patient is noted to have fallen and the urgent care parking lot last week. Patient was seen here the urgent care time. X-rays were negative. Patient is formally diagnosed with rhomboid strain and rib contusion. Patient was placed on schedule Mobic for pain. Patient states that pain has persisted and in fact worsened over this time frame. Patient denies any shortness of breath with symptoms. No anterior chest pain. Patient has been taking Mobic as scheduled. Patient reports she has a baseline history of chronic low back pain. Patient states she's been taking high amount Tylenol #3s with minimal improvement in symptoms. Patient would like to be reevaluated for pain and she feels like pain has worsened.  Past Medical History  Diagnosis Date  . Impaired fasting glucose   . Hypertension   . Hyperlipidemia   . Bipolar 2 disorder     pscyh hospitaliztion 2006  . Stress incontinence, female   . Obesity   . DDD (degenerative disc disease), lumbar     uses tens unit  . Hernia     abdominal incisional  . Hypercholesteremia   . Herniated disc     Past Surgical History  Procedure Laterality Date  . Total abdominal hysterectomy w/ bilateral salpingoophorectomy  2001    for ovarian cyst  . Cholecystectomy    . Abdominal hysterectomy    . Bladder repair    . Thyroidectomy    . Bladder repair    . Thyroidectomy      Family History  Problem Relation Age of Onset  . Cancer Mother 52    breast  . Other Father     AMI  . Hyperlipidemia Father   . Hypertension Father   . Heart attack Father   . Depression Sister   . Cancer Other 80    breast    History  Substance Use Topics  . Smoking status: Former Smoker    Quit date: 07/23/1994  .  Smokeless tobacco: Not on file  . Alcohol Use: Yes     Comment: very rarely    OB History   Grav Para Term Preterm Abortions TAB SAB Ect Mult Living                  Review of Systems  All other systems reviewed and are negative.    Allergies  Erythromycin; Grapefruit diet or; Miconazole nitrate; Other; and Pravastatin  Home Medications   Current Outpatient Rx  Name  Route  Sig  Dispense  Refill  . acetaminophen-codeine (TYLENOL #3) 300-30 MG per tablet   Oral   Take 1 tablet by mouth every 6 (six) hours as needed. For severe pain   50 tablet   1   . amoxicillin-clavulanate (AUGMENTIN) 875-125 MG per tablet   Oral   Take 1 tablet by mouth 2 (two) times daily. For 10 days.   20 tablet   0   . buPROPion (WELLBUTRIN XL) 300 MG 24 hr tablet      take 1 tablet by mouth once daily   30 tablet   5   . econazole nitrate 1 % cream   Topical   Apply topically daily. Apply topically daily.  15 g   1   . furosemide (LASIX) 40 MG tablet   Oral   Take 1 tablet (40 mg total) by mouth daily. As needed for lower extremity edema.   30 tablet   1   . lamoTRIgine (LAMICTAL) 200 MG tablet   Oral   Take 200 mg by mouth daily.           . meloxicam (MOBIC) 15 MG tablet   Oral   Take 1 tablet (15 mg total) by mouth daily.   30 tablet   1   . sertraline (ZOLOFT) 100 MG tablet      take 1 tablet by mouth once daily   30 tablet   5   . traMADol (ULTRAM) 50 MG tablet      Take every 8hr as needed for nerve pain.   30 tablet   1   . Triamcinolone Acetonide (TRIAMCINOLONE 0.1 % CREAM : EUCERIN) CREA   Topical   Apply 1 application topically 2 (two) times daily.   1 each   0     One to one. 1 jar.   Marya Landry 40-10-25 MG TABS      take 1 tablet by mouth once daily   30 tablet   3   . EXPIRED: simvastatin (ZOCOR) 80 MG tablet   Oral   Take 1 tablet (80 mg total) by mouth at bedtime.   90 tablet   1     BP 154/80  Pulse 77  Temp(Src) 98 F (36.7  C) (Oral)  Ht 5\' 5"  (1.651 m)  Wt 270 lb (122.471 kg)  BMI 44.93 kg/m2  SpO2 97%  Physical Exam  Constitutional:  Morbidly obese    HENT:  Head: Normocephalic and atraumatic.  Eyes: Conjunctivae are normal. Pupils are equal, round, and reactive to light.  Neck: Normal range of motion.  Cardiovascular: Normal rate, regular rhythm and normal heart sounds.   Pulmonary/Chest: Effort normal.  Abdominal:  Obese abdomen, non tender    Musculoskeletal:       Arms: + TTP over affected area  No bruising    Neurological: She is alert.  Skin: Skin is warm.    ED Course  Procedures (including critical care time)  Labs Reviewed - No data to display Dg Ribs Unilateral W/chest Right  11/14/2012  *RADIOLOGY REPORT*  Clinical Data: Larey Seat last week, persistent back and rib pain  RIGHT RIBS AND CHEST - 3+ VIEW  Comparison: Recent prior rib series 11/05/2012  Findings: Stable calcified granulomas in the right upper lung and upper right hila.  Otherwise, the lungs are clear.  Cardiac and mediastinal contours are within normal limits.  Evaluation of the ribs mildly limited secondary to patient body habitus.  No displaced rib fracture identified.  IMPRESSION:  No displaced rib fracture identified.  No acute cardiopulmonary disease.   Original Report Authenticated By: Malachy Moan, M.D.    Dg Thoracic Spine 2 View  11/14/2012  *RADIOLOGY REPORT*  Clinical Data: Fall last week, thoracic back pain  THORACIC SPINE - 2 VIEW  Comparison: Prior chest x-ray and rib series 11/05/2012  Findings: Frontal and lateral radiographs of the thoracic spine which are slightly limited by patient body habitus and resulting beam hardening artifact demonstrate no definite acute fracture or malalignment.  Vertebral body heights and intervertebral disc spaces are grossly maintained.  Mild multilevel degenerative change throughout the thoracic spine.  Calcified right hilar and right upper lung granulomas have not changed  dating back  to December 2009.  IMPRESSION: No acute fracture or malalignment.   Original Report Authenticated By: Malachy Moan, M.D.      1. Upper back strain, subsequent encounter   2. Rib contusion, right, subsequent encounter       MDM  No fracture or dislocation on repeat imaging.  Will add on lidoderm patch and tramadol to treatment regimen.  Discussed general care and [facility] red flags.  Follow up with PCP if sxs persist.     The patient and/or caregiver has been counseled thoroughly with regard to treatment plan and/or medications prescribed including dosage, schedule, interactions, rationale for use, and possible side effects and they verbalize understanding. Diagnoses and expected course of recovery discussed and will return if not improved as expected or if the condition worsens. Patient and/or caregiver verbalized understanding.             Doree Albee, MD 11/14/12 [id number]

## 2012-11-14 NOTE — ED Notes (Signed)
Fell 9 days ago and injured right ribs, still having pain in right ribs and right shoulder

## 2012-11-18 ENCOUNTER — Other Ambulatory Visit (HOSPITAL_COMMUNITY): Payer: Self-pay | Admitting: Psychiatry

## 2012-12-11 ENCOUNTER — Ambulatory Visit (INDEPENDENT_AMBULATORY_CARE_PROVIDER_SITE_OTHER): Payer: BC Managed Care – PPO | Admitting: Family Medicine

## 2012-12-11 ENCOUNTER — Encounter: Payer: Self-pay | Admitting: Family Medicine

## 2012-12-11 VITALS — BP 143/87 | HR 74 | Wt 261.0 lb

## 2012-12-11 DIAGNOSIS — Z630 Problems in relationship with spouse or partner: Secondary | ICD-10-CM

## 2012-12-11 DIAGNOSIS — R7309 Other abnormal glucose: Secondary | ICD-10-CM

## 2012-12-11 DIAGNOSIS — R7303 Prediabetes: Secondary | ICD-10-CM

## 2012-12-11 DIAGNOSIS — R110 Nausea: Secondary | ICD-10-CM

## 2012-12-11 LAB — COMPLETE METABOLIC PANEL WITH GFR
Alkaline Phosphatase: 100 U/L (ref 39–117)
BUN: 14 mg/dL (ref 6–23)
CO2: 28 mEq/L (ref 19–32)
Creat: 0.95 mg/dL (ref 0.50–1.10)
GFR, Est African American: 78 mL/min
GFR, Est Non African American: 68 mL/min
Glucose, Bld: 107 mg/dL — ABNORMAL HIGH (ref 70–99)
Total Bilirubin: 0.5 mg/dL (ref 0.3–1.2)

## 2012-12-11 NOTE — Progress Notes (Signed)
CC: Sharon Hoover is a 57 y.o. female is here for uneasy feeling on the stomach   Subjective: HPI:  Patient presents complaining of queasiness further described as an unsettling feeling somewhat like nausea without vomiting. This is been present for 2 weeks. Most noticeable after sugar-containing beverages or foods. Also noticed if not eating anything after a 4 hour period. Present a daily basis multiple times a day mild to moderate in severity. Nothing else particularly makes it better or worse. Reports symptoms are identical to when she had gestational diabetes. She constantly drinks throughout the day denies any change over the past year. Denies polyuria. Denies motor or sensory disturbances, poorly healing wounds, nor unintentional weight loss or gain. She does have a history of prediabetes.  Patient requires frequent redirection to her chief complaint citing mild relationship and living preference differences between she and her current partner along with stress about losing her job and trying to find a new house. She feels that the increase stress of the above has not interfered or correlated with her chief complaint. Denies physically, verbal or psychologically abusive relationship but is having trouble adjusting to having another adult living with her.  Review Of Systems Outlined In HPI  Past Medical History  Diagnosis Date  . Impaired fasting glucose   . Hypertension   . Hyperlipidemia   . Bipolar 2 disorder     pscyh hospitaliztion 2006  . Stress incontinence, female   . Obesity   . DDD (degenerative disc disease), lumbar     uses tens unit  . Hernia     abdominal incisional  . Hypercholesteremia   . Herniated disc      Family History  Problem Relation Age of Onset  . Cancer Mother 16    breast  . Other Father     AMI  . Hyperlipidemia Father   . Hypertension Father   . Heart attack Father   . Depression Sister   . Cancer Other 80    breast     History   Substance Use Topics  . Smoking status: Former Smoker    Quit date: 07/23/1994  . Smokeless tobacco: Not on file  . Alcohol Use: Yes     Comment: very rarely     Objective: Filed Vitals:   12/11/12 0847  BP: 143/87  Pulse: 74    General: Alert and Oriented, No Acute Distress HEENT: Pupils equal, round, reactive to light. Conjunctivae clear.  Moist mucous membranes Lungs: Clear to auscultation bilaterally, no wheezing/ronchi/rales.  Comfortable work of breathing. Good air movement. Cardiac: Regular rate and rhythm. Normal S1/S2.  No murmurs, rubs, nor gallops.   Abdomen: Obese soft nontender Extremities: No peripheral edema.  Strong peripheral pulses.  Mental Status: No depression, anxiety, nor agitation. Circumferential thought process Skin: Warm and dry.  Assessment & Plan: Brytnee was seen today for uneasy feeling on the stomach.  Diagnoses and associated orders for this visit:  Prediabetes - Hemoglobin A1c - COMPLETE METABOLIC PANEL WITH GFR  Feeling queasy - COMPLETE METABOLIC PANEL WITH GFR  Relationship problem between partners    Queasiness: Will rule out hyperglycemia renal abnormality nor liver dysfunction with above fasting labs. Brief counseling was provided on how to help adjust to a new cohabiting environment.  25 minutes spent face-to-face during visit today of which at least 50% was counseling or coordinating care regarding prediabetes, queasy, relationship problem between partners.   Return if symptoms worsen or fail to improve.

## 2012-12-12 ENCOUNTER — Other Ambulatory Visit: Payer: Self-pay | Admitting: Physician Assistant

## 2012-12-26 ENCOUNTER — Other Ambulatory Visit: Payer: Self-pay | Admitting: Family Medicine

## 2013-01-19 ENCOUNTER — Other Ambulatory Visit: Payer: Self-pay | Admitting: Family Medicine

## 2013-01-20 ENCOUNTER — Other Ambulatory Visit: Payer: Self-pay | Admitting: *Deleted

## 2013-01-20 MED ORDER — ECONAZOLE NITRATE 1 % EX CREA: TOPICAL_CREAM | Freq: Every day | CUTANEOUS | Status: DC

## 2013-02-26 ENCOUNTER — Other Ambulatory Visit: Payer: Self-pay | Admitting: Physician Assistant

## 2013-03-09 ENCOUNTER — Other Ambulatory Visit: Payer: Self-pay | Admitting: Family Medicine

## 2013-03-10 ENCOUNTER — Encounter (HOSPITAL_COMMUNITY): Payer: Self-pay | Admitting: Psychiatry

## 2013-03-10 ENCOUNTER — Ambulatory Visit (HOSPITAL_COMMUNITY): Payer: Self-pay | Admitting: Psychiatry

## 2013-03-11 ENCOUNTER — Ambulatory Visit (INDEPENDENT_AMBULATORY_CARE_PROVIDER_SITE_OTHER): Payer: BC Managed Care – PPO | Admitting: Physician Assistant

## 2013-03-11 ENCOUNTER — Encounter: Payer: Self-pay | Admitting: Physician Assistant

## 2013-03-11 VITALS — BP 147/82 | HR 74 | Wt 264.0 lb

## 2013-03-11 DIAGNOSIS — G8929 Other chronic pain: Secondary | ICD-10-CM

## 2013-03-11 DIAGNOSIS — M25551 Pain in right hip: Secondary | ICD-10-CM

## 2013-03-11 DIAGNOSIS — M25552 Pain in left hip: Secondary | ICD-10-CM

## 2013-03-11 DIAGNOSIS — M25559 Pain in unspecified hip: Secondary | ICD-10-CM

## 2013-03-11 MED ORDER — ACETAMINOPHEN-CODEINE #3 300-30 MG PO TABS: 1.0000 | ORAL_TABLET | Freq: Four times a day (QID) | ORAL | Status: DC | PRN

## 2013-03-11 NOTE — Progress Notes (Signed)
  Subjective:    Patient ID: Sharon Hoover, female    DOB: 25-Jan-1956, 57 y.o.   MRN: [id number]  HPI Patient presents to the clinic to get refill on tylenol 3. She needs refill earlier than she should because she had a fall and went to urgent care about 3 months ago. She used more than she usally does. She currently dose not take daily but only as needed for bilateral hip pain. Her hip pain has normalized in go back to taking Tylenol 3 as needed for pain.    Review of Systems     Objective:   Physical Exam  Constitutional: She is oriented to person, place, and time. She appears well-developed and well-nourished.  Obese  HENT:  Head: Normocephalic and atraumatic.  Cardiovascular: Normal rate, regular rhythm and normal heart sounds.   Pulmonary/Chest: Effort normal and breath sounds normal.  Neurological: She is alert and oriented to person, place, and time.  Skin: Skin is warm and dry.  Psychiatric: She has a normal mood and affect. Her behavior is normal.          Assessment & Plan:  Bilateral hip pain, chronic-liver enzymes were good when last checked in May. Refilled Tylenol 3 for use as needed for hip pain. Followup in 6 months.

## 2013-03-25 ENCOUNTER — Other Ambulatory Visit: Payer: Self-pay | Admitting: Physician Assistant

## 2013-04-03 ENCOUNTER — Telehealth (HOSPITAL_COMMUNITY): Payer: Self-pay

## 2013-04-03 NOTE — Telephone Encounter (Signed)
Wrong patient

## 2013-04-03 NOTE — Telephone Encounter (Signed)
NEEDS NEW LAB ORDER TO HAVE DONE NEXT WEEK.

## 2013-04-08 ENCOUNTER — Ambulatory Visit (HOSPITAL_COMMUNITY): Payer: BC Managed Care – PPO | Admitting: Psychiatry

## 2013-04-08 ENCOUNTER — Ambulatory Visit (INDEPENDENT_AMBULATORY_CARE_PROVIDER_SITE_OTHER): Payer: BC Managed Care – PPO | Admitting: Psychiatry

## 2013-04-08 ENCOUNTER — Encounter (HOSPITAL_COMMUNITY): Payer: Self-pay | Admitting: Psychiatry

## 2013-04-08 VITALS — BP 118/78 | Ht 65.0 in | Wt 264.0 lb

## 2013-04-08 DIAGNOSIS — F3189 Other bipolar disorder: Secondary | ICD-10-CM

## 2013-04-08 DIAGNOSIS — F3181 Bipolar II disorder: Secondary | ICD-10-CM

## 2013-04-08 MED ORDER — SERTRALINE HCL 100 MG PO TABS: 150.0000 mg | ORAL_TABLET | Freq: Every day | ORAL | Status: DC

## 2013-04-08 NOTE — Progress Notes (Signed)
[facility] Follow-up Outpatient Visit  Sharon Hoover [birth year]   Subjective: The patient is a 57 year old female who has been followed by [facility] since April of 2009. She is currently diagnosed with bipolar affective disorder type II. She remains stable on her current medications of Wellbutrin XL, Lamictal, and Zoloft. I did not make any changes in her last appointment. She presents today. She reports she's been very sad lately. She's been feeling well. Her boyfriend continues to live at the house. He has gotten his car fixed, but they're still measures he needs to take before he can move out. The patient is currently taking 2 classes on Tuesdays and Thursdays from 1 until 5 on campus. They were too difficult for her to take on line. 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 She cried in class yesterday when she could not get a concept. Her teacher has been helping her. The patient has no money. She has had 3 closings in 3 months, but they were very small. The patient had her son's half sister for the summer. It during the resources. The patient is not suicidal. She is very tearful today in the office. She understands some of it is situational, but she is feeling overwhelmed.  Filed Vitals:   04/08/13 1349  BP: 118/78   Active Ambulatory Problems    Diagnosis Date Noted  . THYROID MASS 07/08/2009  . HYPERCHOLESTEROLEMIA 07/08/2007  . BIPOLAR DISORDER UNSPECIFIED 07/08/2007  . ADJUSTMENT DISORDER WITH DEPRESSED MOOD 08/18/2008  . ESSENTIAL HYPERTENSION, BENIGN 07/08/2007  . ALLERGIC RHINITIS 03/22/2010  . BRONCHITIS, ALLERGIC 05/31/2010  . ASTHMA UNSPECIFIED WITH EXACERBATION 08/18/2008  . IBS 06/07/2008  . FEMALE STRESS INCONTINENCE 07/08/2007  . ACNE VULGARIS 06/07/2008  . DISC DISEASE, LUMBAR 07/08/2007  . CERVICALGIA 11/04/2009  . TRIGGER FINGER 04/01/2008  . CHEST WALL PAIN, ACUTE 06/22/2008  . IMPAIRED  FASTING GLUCOSE 01/23/2008  . MUSCLE STRAIN, HAMSTRING MUSCLE 03/22/2010  . NECK SPASM 09/02/2008  . BACK STRAIN 11/04/2009  . DIZZINESS 08/18/2010  . CONTUSION, HEAD 08/18/2010  . NUMBNESS, ARM 09/27/2010  . Obesity, Class III, BMI 40-49.9 (morbid obesity) 11/15/2010  . Shoulder bursitis 11/15/2010  . Hip pain 11/30/2010   Resolved Ambulatory Problems    Diagnosis Date Noted  . Acute bronchitis 01/02/2008  . CELLULITIS 11/09/2009  . TOE PAIN 12/22/2008  . Wheezing 09/07/2008  . FRACTURE, FINGER, DISTAL 07/08/2009  . FRACTURE, TOE 12/23/2008   Past Medical History  Diagnosis Date  . Hypertension   . Hyperlipidemia   . Bipolar 2 disorder   . Stress incontinence, female   . Obesity   . DDD (degenerative disc disease), lumbar   . Hernia   . Hypercholesteremia   . Herniated disc    Current Outpatient Prescriptions on File Prior to Visit  Medication Sig Dispense Refill  . acetaminophen-codeine (TYLENOL #3) 300-30 MG per tablet Take 1 tablet by mouth every 6 (six) hours as needed. For severe pain  60 tablet  1  . buPROPion (WELLBUTRIN XL) 300 MG 24 hr tablet take 1 tablet by mouth once daily  30 tablet  5  . econazole nitrate 1 % cream Apply topically daily. Apply topically daily.  15 g  1  . furosemide (LASIX) 40 MG tablet Take 1 tablet (40 mg total) by mouth daily. As needed for lower extremity edema.  30 tablet  1  . lamoTRIgine (LAMICTAL) 100 MG tablet take 2 tablets by mouth  at bedtime  60 tablet  5  . meloxicam (MOBIC) 15 MG tablet take 1 tablet by mouth once daily  30 tablet  1  . simvastatin (ZOCOR) 80 MG tablet take 1 tablet by mouth at bedtime  90 tablet  1  . traMADol (ULTRAM) 50 MG tablet Take 1 tablet (50 mg total) by mouth every 6 (six) hours as needed for pain.  30 tablet  3  . Triamcinolone Acetonide (TRIAMCINOLONE 0.1 % CREAM : EUCERIN) CREA Apply 1 application topically 2 (two) times daily.  1 each  0  . TRIBENZOR 40-10-25 MG TABS take 1 tablet by mouth once  daily  30 tablet  2   No current facility-administered medications on file prior to visit.   Review of Systems - General ROS: negative for - sleep disturbance or weight loss Psychological ROS: positive for - anxiety Cardiovascular ROS: no chest pain or dyspnea on exertion Musculoskeletal ROS: negative for - gait disturbance or muscular weakness Neurological ROS: negative for - headaches or seizures  Mental Status Examination  Appearance: Casual Alert: Yes Attention: good  Cooperative: Yes Eye Contact: Good Speech: Regular rate rhythm and volume Psychomotor Activity: Normal Memory/Concentration: Intact Oriented: person, place, time/date and situation Mood: Euthymic Affect: Full Range Thought Processes and Associations: Logical Fund of Knowledge: Fair Thought Content: No suicidal or homicidal thoughts Insight: Fair Judgement: Fair  Diagnosis: Bipolar affective disorder type II  Treatment Plan: I will increase Zoloft to 150 mg daily. I will continue the Lamictal at 200 mg daily along with Wellbutrin XL 300 mg daily. I will see the patient back in 3 months. Patient may call with concerns.  Jamse Mead, MD

## 2013-06-01 ENCOUNTER — Telehealth (HOSPITAL_COMMUNITY): Payer: Self-pay

## 2013-06-01 MED ORDER — BUPROPION HCL ER (XL) 300 MG PO TB24: 300.0000 mg | ORAL_TABLET | Freq: Every day | ORAL | Status: DC

## 2013-06-01 NOTE — Telephone Encounter (Signed)
Called patient. Left message stating her medication has been sent in.

## 2013-06-01 NOTE — Telephone Encounter (Signed)
Please send wellbutrin refill to pharmacy. Pt is now out

## 2013-06-03 ENCOUNTER — Other Ambulatory Visit: Payer: Self-pay | Admitting: Physician Assistant

## 2013-06-16 ENCOUNTER — Telehealth: Payer: Self-pay | Admitting: *Deleted

## 2013-06-16 NOTE — Telephone Encounter (Signed)
Ins will not cover the pt's tribenzor.  She is going to have to try any generic ARB or you can do a peer to peer to try to get the tribenzor to go through.

## 2013-06-17 ENCOUNTER — Ambulatory Visit (INDEPENDENT_AMBULATORY_CARE_PROVIDER_SITE_OTHER): Payer: BC Managed Care – PPO | Admitting: Physician Assistant

## 2013-06-17 ENCOUNTER — Encounter: Payer: Self-pay | Admitting: Physician Assistant

## 2013-06-17 VITALS — BP 149/60 | HR 78 | Wt 262.0 lb

## 2013-06-17 DIAGNOSIS — I10 Essential (primary) hypertension: Secondary | ICD-10-CM

## 2013-06-17 DIAGNOSIS — F319 Bipolar disorder, unspecified: Secondary | ICD-10-CM

## 2013-06-17 NOTE — Progress Notes (Signed)
  Subjective:    Patient ID: Sharon Hoover, female    DOB: 19-Nov-1955, 57 y.o.   MRN: [id number]  HPI Patient is a 57 year old female who presents to the clinic to followup on hypertension. Patient has been out of her medicines for the last 4 days. He recently received an insurance factors that they would not cover try been sore. Patient has been on troponin sore for over a year and blood pressures are really well controlled. Per patient this happen last year also would like for [place] to try to get it approved. She denies any chest pains, palpitations, headaches.  Patient is somewhat upset today because her psychiatrist that she loves is sleeping. She's asking me to refill her psychiatric meds until she finds another psychiatrist. She has been controlled for a long time however she has still more up and down over the past couple months. She admits that she has a lot going on. Her house is very stressful she is trying to get her ex-boyfriend to move. She has been sleeping on the couch Phyllis 4-6 month period which is on the couch she does not use her CPAP machine. Right now things are very stressful financially. She denies any suicidal or homicidal thoughts. She did take a new job that will provide more financial stability.   Review of Systems     Objective:   Physical Exam  Constitutional: She is oriented to person, place, and time. She appears well-developed and well-nourished.  HENT:  Head: Normocephalic and atraumatic.  Cardiovascular: Normal rate, regular rhythm and normal heart sounds.   Pulmonary/Chest: Effort normal and breath sounds normal.  Neurological: She is alert and oriented to person, place, and time.  Skin: Skin is warm and dry.  Psychiatric: She has a normal mood and affect. Her behavior is normal.          Assessment & Plan:  Hypertension-gave samples of Tribenzor today. Will get peer to peer review to get approved. Patient encouraged to recheck blood pressure once  been on medication for couple days to make sure BP is still controlled.   Bipolar- discuss with patient that I would refill her medications and so she found another psychiatrist.  Pt declines flu shot.

## 2013-06-17 NOTE — Telephone Encounter (Signed)
[phone]. Reference # [id number]

## 2013-06-17 NOTE — Telephone Encounter (Signed)
Would like to do peer review.

## 2013-06-19 ENCOUNTER — Ambulatory Visit: Payer: Self-pay | Admitting: Family Medicine

## 2013-06-29 NOTE — Telephone Encounter (Signed)
That's the only # U I was given.

## 2013-06-29 NOTE — Telephone Encounter (Signed)
I called the number they do not do medication authorizations. I need the number to do medication authorization not imaging.

## 2013-07-08 ENCOUNTER — Ambulatory Visit (HOSPITAL_COMMUNITY): Payer: Self-pay | Admitting: Psychiatry

## 2013-07-12 ENCOUNTER — Other Ambulatory Visit (HOSPITAL_COMMUNITY): Payer: Self-pay | Admitting: Psychiatry

## 2013-07-13 ENCOUNTER — Other Ambulatory Visit: Payer: Self-pay | Admitting: *Deleted

## 2013-07-13 MED ORDER — BUPROPION HCL ER (XL) 300 MG PO TB24: 300.0000 mg | ORAL_TABLET | Freq: Every day | ORAL | Status: DC

## 2013-07-14 NOTE — Telephone Encounter (Signed)
Bupropion filled by PCP. Patient has not rescheduled with this provider. She will need to be see to continue to receive medications.

## 2013-08-13 ENCOUNTER — Telehealth: Payer: Self-pay

## 2013-08-13 NOTE — Telephone Encounter (Signed)
She has tried amoldipine, benazepril, HCTZ, and lotrel. Give samples if needed in meantime.

## 2013-08-13 NOTE — Telephone Encounter (Signed)
FYI Awaiting prior Auth for Tribenzor.   I started a prior Auth for [employer]. She may need to switch medications.

## 2013-08-17 ENCOUNTER — Other Ambulatory Visit: Payer: Self-pay | Admitting: Physician Assistant

## 2013-08-19 NOTE — Telephone Encounter (Signed)
Faxed form. Awaiting a response.

## 2013-08-19 NOTE — Telephone Encounter (Signed)
I called to see about the prior auth I started on 08/13/2013. There was no record. I started another prior auth and they will fax a form.

## 2013-08-21 NOTE — Telephone Encounter (Signed)
Ok they are not going to approve Tri-benzor. We have two options 3 different pills at same dose as now or can do diovan HCT which would be two together but slightly different ARB component and then norvasc added on. What would you like to do?

## 2013-08-25 ENCOUNTER — Other Ambulatory Visit: Payer: Self-pay | Admitting: Physician Assistant

## 2013-08-25 MED ORDER — AMLODIPINE BESYLATE 10 MG PO TABS: 10.0000 mg | ORAL_TABLET | Freq: Every day | ORAL | Status: DC

## 2013-08-25 MED ORDER — VALSARTAN-HYDROCHLOROTHIAZIDE 160-25 MG PO TABS: 1.0000 | ORAL_TABLET | Freq: Every day | ORAL | Status: DC

## 2013-08-25 NOTE — Telephone Encounter (Signed)
Sent both rx to pharmacy. Follow up in3 months for BP recheck.

## 2013-08-25 NOTE — Telephone Encounter (Signed)
Spoke with pt & she would like the Diovan & Norvasc option.

## 2013-08-25 NOTE — Telephone Encounter (Signed)
Pt.notified

## 2013-09-01 ENCOUNTER — Other Ambulatory Visit (HOSPITAL_COMMUNITY): Payer: Self-pay | Admitting: Physician Assistant

## 2013-09-16 ENCOUNTER — Ambulatory Visit (INDEPENDENT_AMBULATORY_CARE_PROVIDER_SITE_OTHER): Payer: BC Managed Care – PPO

## 2013-09-16 ENCOUNTER — Ambulatory Visit (INDEPENDENT_AMBULATORY_CARE_PROVIDER_SITE_OTHER): Payer: BC Managed Care – PPO | Admitting: Physician Assistant

## 2013-09-16 ENCOUNTER — Encounter: Payer: Self-pay | Admitting: Physician Assistant

## 2013-09-16 VITALS — BP 149/75 | HR 67 | Wt 261.0 lb

## 2013-09-16 DIAGNOSIS — M79609 Pain in unspecified limb: Secondary | ICD-10-CM

## 2013-09-16 DIAGNOSIS — M79645 Pain in left finger(s): Secondary | ICD-10-CM

## 2013-09-16 DIAGNOSIS — I10 Essential (primary) hypertension: Secondary | ICD-10-CM

## 2013-09-16 DIAGNOSIS — M65312 Trigger thumb, left thumb: Secondary | ICD-10-CM

## 2013-09-16 DIAGNOSIS — M6530 Trigger finger, unspecified finger: Secondary | ICD-10-CM

## 2013-09-16 NOTE — Patient Instructions (Addendum)
Continue on mobic. Ice area. Rest. Will keep in thumb spica for at least 2-4 weeks. Will get imaging.   Trigger Finger Trigger finger (digital tendinitis and stenosing tenosynovitis) is a common disorder that causes an often painful catching of the fingers or thumb. It occurs as a clicking, snapping, or locking of a finger in the palm of the hand. This is caused by a problem with the tendons that flex or bend the fingers sliding smoothly through their sheaths. The condition may occur in any finger or a couple fingers at the same time.  The finger may lock with the finger curled or suddenly straighten out with a snap. This is more common in patients with rheumatoid arthritis and diabetes. Left untreated, the condition may get worse to the point where the finger becomes locked in flexion, like making a fist, or less commonly locked with the finger straightened out. CAUSES   Inflammation and scarring that lead to swelling around the tendon sheath.  Repeated or forceful movements.  Rheumatoid arthritis, an autoimmune disease that affects joints.  Gout.  Diabetes mellitus. SIGNS AND SYMPTOMS  Soreness and swelling of your finger.  A painful clicking or snapping as you bend and straighten your finger. DIAGNOSIS  Your health care provider will do a physical exam of your finger to diagnose trigger finger. TREATMENT   Splinting for 6 8 weeks may be helpful.  Nonsteroidal anti-inflammatory medicines (NSAIDs) can help to relieve the pain and inflammation.  Cortisone injections, along with splinting, may speed up recovery. Several injections may be required. Cortisone may give relief after one injection.  Surgery is another treatment that may be used if conservative treatments do not work. Surgery can be minor, without incisions (a cut does not have to be made), and can be done with a needle through the skin.  Other surgical choices involve an open procedure in which the surgeon opens the hand  through a small incision and cuts the pulley so the tendon can again slide smoothly. Your hand will still work fine. HOME CARE INSTRUCTIONS  Apply ice to the injured area, twice per day:  Put ice in a plastic bag.  Place a towel between your skin and the bag.  Leave the ice on for 20 minutes, 3 4 times a day.  Rest your hand often. MAKE SURE YOU:   Understand these instructions.  Will watch your condition.  Will get help right away if you are not doing well or get worse. Document Released: 04/28/2004 Document Revised: 03/11/2013 Document Reviewed: 12/09/2012 [facility] [employer]ExitCare Patient Information 2014 [employer]Care, [place].

## 2013-09-16 NOTE — Progress Notes (Signed)
   Subjective:    Patient ID: Sharon Hoover, female    DOB: 1956/05/15, 58 y.o.   MRN: [id number]  HPI Pt presents to the clinic with left thumb pain off and on for 3 months as well as a nodule at base of thumb that she has noticed for 2 weeks. Pt does do a lot with her hands. 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 Thumb has started to ache so bad she doesn't want to knit. She admits to hearing it pop and click with ROM sometimes. She denies any injury or trauma. NO radiation pain, numbness into hand or wrist.  She takes mobic daily other than that not tried anything.     Review of Systems     Objective:   Physical Exam  Constitutional: She appears well-developed and well-nourished.  Musculoskeletal:  Left thumb- no pain to palpation over MCP, PCP, or DIP. I did palpate a small hard nodule at MCP medial side. ROM of left thumb normal today without any clicking or popping. Strength 4/5 from right thumb. Negative finklestein, phalen, tinels. No bruising or swelling.   Psychiatric: She has a normal mood and affect. Her behavior is normal.          Assessment & Plan:  Pain of left thumb/nodule- Via HPI sounds like trigger thumb although on exam today I heard no clicking/popping and did not get locked. I placed in a thumb on spica for 2-4 weeks. Will get xrays. Discussed rest, NSAIDs(pt already on mobic daily). If not improving with have Dr. Karie Schwalbe look at it and determine if he would like to try injection.    HTN- BP elevated today. Did not take medication this morning.

## 2013-09-28 ENCOUNTER — Other Ambulatory Visit: Payer: Self-pay | Admitting: Physician Assistant

## 2013-09-29 ENCOUNTER — Encounter: Payer: Self-pay | Admitting: Physician Assistant

## 2013-09-29 ENCOUNTER — Ambulatory Visit (INDEPENDENT_AMBULATORY_CARE_PROVIDER_SITE_OTHER): Payer: BC Managed Care – PPO | Admitting: Physician Assistant

## 2013-09-29 VITALS — BP 148/66 | HR 76 | Wt 262.0 lb

## 2013-09-29 DIAGNOSIS — J45909 Unspecified asthma, uncomplicated: Secondary | ICD-10-CM

## 2013-09-29 DIAGNOSIS — R059 Cough, unspecified: Secondary | ICD-10-CM

## 2013-09-29 DIAGNOSIS — M6530 Trigger finger, unspecified finger: Secondary | ICD-10-CM

## 2013-09-29 DIAGNOSIS — R05 Cough: Secondary | ICD-10-CM

## 2013-09-29 DIAGNOSIS — M65312 Trigger thumb, left thumb: Secondary | ICD-10-CM

## 2013-09-29 MED ORDER — ALBUTEROL SULFATE (2.5 MG/3ML) 0.083% IN NEBU
2.5000 mg | INHALATION_SOLUTION | Freq: Once | RESPIRATORY_TRACT | Status: AC
  Administered 2013-09-29: 2.5 mg via RESPIRATORY_TRACT

## 2013-09-29 MED ORDER — FLUCONAZOLE 150 MG PO TABS: 150.0000 mg | ORAL_TABLET | Freq: Once | ORAL | Status: DC

## 2013-09-29 MED ORDER — AZITHROMYCIN 250 MG PO TABS: ORAL_TABLET | ORAL | Status: DC

## 2013-09-29 MED ORDER — PREDNISONE 50 MG PO TABS: ORAL_TABLET | ORAL | Status: DC

## 2013-09-29 MED ORDER — BENZONATATE 200 MG PO CAPS: 200.0000 mg | ORAL_CAPSULE | Freq: Two times a day (BID) | ORAL | Status: DC | PRN

## 2013-09-29 MED ORDER — ALBUTEROL SULFATE HFA 108 (90 BASE) MCG/ACT IN AERS: 2.0000 | INHALATION_SPRAY | Freq: Four times a day (QID) | RESPIRATORY_TRACT | Status: DC | PRN

## 2013-09-29 NOTE — Patient Instructions (Signed)
Start zpak and prednisone burst.  Albuterol inhaler as needed up to every 4-6 hours.  Tessalon pearles for cough.  Diflucan for yeast.    Bronchitis Bronchitis is inflammation of the airways that extend from the windpipe into the lungs (bronchi). The inflammation often causes mucus to develop, which leads to a cough. If the inflammation becomes severe, it may cause shortness of breath. CAUSES  Bronchitis may be caused by:   Viral infections.   Bacteria.   Cigarette smoke.   Allergens, pollutants, and other irritants.  SIGNS AND SYMPTOMS  The most common symptom of bronchitis is a frequent cough that produces mucus. Other symptoms include:  Fever.   Body aches.   Chest congestion.   Chills.   Shortness of breath.   Sore throat.  DIAGNOSIS  Bronchitis is usually diagnosed through a medical history and physical exam. Tests, such as chest X-rays, are sometimes done to rule out other conditions.  TREATMENT  You may need to avoid contact with whatever caused the problem (smoking, for example). Medicines are sometimes needed. These may include:  Antibiotics. These may be prescribed if the condition is caused by bacteria.  Cough suppressants. These may be prescribed for relief of cough symptoms.   Inhaled medicines. These may be prescribed to help open your airways and make it easier for you to breathe.   Steroid medicines. These may be prescribed for those with recurrent (chronic) bronchitis. HOME CARE INSTRUCTIONS  Get plenty of rest.   Drink enough fluids to keep your urine clear or pale yellow (unless you have a medical condition that requires fluid restriction). Increasing fluids may help thin your secretions and will prevent dehydration.   Only take over-the-counter or prescription medicines as directed by your health care provider.  Only take antibiotics as directed. Make sure you finish them even if you start to feel better.  Avoid secondhand smoke,  irritating chemicals, and strong fumes. These will make bronchitis worse. If you are a smoker, quit smoking. Consider using nicotine gum or skin patches to help control withdrawal symptoms. Quitting smoking will help your lungs heal faster.   Put a cool-mist humidifier in your bedroom at night to moisten the air. This may help loosen mucus. Change the water in the humidifier daily. You can also run the hot water in your shower and sit in the bathroom with the door closed for 5 10 minutes.   Follow up with your health care provider as directed.   Wash your hands frequently to avoid catching bronchitis again or spreading an infection to others.  SEEK MEDICAL CARE IF: Your symptoms do not improve after 1 week of treatment.  SEEK IMMEDIATE MEDICAL CARE IF:  Your fever increases.  You have chills.   You have chest pain.   You have worsening shortness of breath.   You have bloody sputum.  You faint.  You have lightheadedness.  You have a severe headache.   You vomit repeatedly. MAKE SURE YOU:   Understand these instructions.  Will watch your condition.  Will get help right away if you are not doing well or get worse. Document Released: 07/09/2005 Document Revised: 04/29/2013 Document Reviewed: 03/03/2013 [facility]Exit[facility] Patient Information 2014 [place]ExitCare, [place]LLC.

## 2013-09-29 NOTE — Progress Notes (Signed)
   Subjective:    Patient ID: Sharon RothmanMelissa P Hoover, female    DOB: 1956-01-08, 58 y.o.   MRN: [id number]  HPI Patient is a 58 year old female who presents to the clinic with ongoing cough for the last month. She reports some production but mainly dry. She feels very tired but her pulse ox remained at 98%. She's tried Mucinex, Delsym and decongestants. Over-the-counter treatment has made some better but cough seems to be getting worse. She started to hear herself wheezing with exertion and at night. She denies any fever, chills, nausea, vomiting or diarrhea. She denies any sore throat or ear pain. She's not had any sinus pressure. At one point she was diagnosed with asthma but she has never been on any long-term asthmatic medications. She has used her albuterol inhaler a few times and has helped.  She was not able to wear the metal thumb spica that she was placed in the last visit for left thumb pain. She said it rubbed a blister on her left thumb. She continues to have pain and catching.    Review of Systems     Objective:   Physical Exam  Constitutional: She is oriented to person, place, and time. She appears well-developed and well-nourished.  HENT:  Head: Normocephalic and atraumatic.  Right Ear: External ear normal.  Left Ear: External ear normal.  Nose: Nose normal.  Mouth/Throat: Oropharynx is clear and moist. No oropharyngeal exudate.  TM's injected bilaterally.   Eyes: Conjunctivae are normal. Right eye exhibits no discharge. Left eye exhibits no discharge.  Neck: Normal range of motion. Neck supple.  Cardiovascular: Normal rate and regular rhythm.   Pulmonary/Chest: Effort normal and breath sounds normal.  Hacking cough with some production. Wheezing bilateral lungs throughout.   Lymphadenopathy:    She has no cervical adenopathy.  Neurological: She is alert and oriented to person, place, and time.  Skin: Skin is dry.  Psychiatric: She has a normal mood and affect. Her behavior  is normal.          Assessment & Plan:  Asthmatic bronchitis-nebulizer was given in office of albuterol. Patient was started on zpak. She was given Diflucan for history of yeast infections after antibiotic use. Prednisone for 5 days was given. Tessalon Perles were given for cough during the day and at bedtime. Use albuterol inhaler as needed. Call if not improving.  Trigger thumb left hand- placed in brace with thumb spica. Reiterated rest and Mobic. Follow up with Dr. Karie Schwalbe in the next 2 weeks if not improving to consider injection.

## 2013-10-09 ENCOUNTER — Ambulatory Visit (INDEPENDENT_AMBULATORY_CARE_PROVIDER_SITE_OTHER): Payer: BC Managed Care – PPO | Admitting: Physician Assistant

## 2013-10-09 ENCOUNTER — Encounter: Payer: Self-pay | Admitting: Physician Assistant

## 2013-10-09 VITALS — BP 132/73 | HR 105 | Wt 262.0 lb

## 2013-10-09 DIAGNOSIS — R350 Frequency of micturition: Secondary | ICD-10-CM

## 2013-10-09 DIAGNOSIS — N390 Urinary tract infection, site not specified: Secondary | ICD-10-CM

## 2013-10-09 DIAGNOSIS — M546 Pain in thoracic spine: Secondary | ICD-10-CM

## 2013-10-09 DIAGNOSIS — M549 Dorsalgia, unspecified: Secondary | ICD-10-CM

## 2013-10-09 LAB — POCT URINALYSIS DIPSTICK
Bilirubin, UA: NEGATIVE
Glucose, UA: NEGATIVE
KETONES UA: NEGATIVE
Nitrite, UA: NEGATIVE
PH UA: 7
PROTEIN UA: 30
SPEC GRAV UA: 1.025
Urobilinogen, UA: 1

## 2013-10-09 MED ORDER — CIPROFLOXACIN HCL 500 MG PO TABS: 500.0000 mg | ORAL_TABLET | Freq: Two times a day (BID) | ORAL | Status: DC

## 2013-10-09 MED ORDER — FLUCONAZOLE 150 MG PO TABS
150.0000 mg | ORAL_TABLET | Freq: Once | ORAL | Status: DC
Start: 2013-10-09 — End: 2013-12-04

## 2013-10-09 NOTE — Patient Instructions (Signed)
Urinary Tract Infection  Urinary tract infections (UTIs) can develop anywhere along your urinary tract. Your urinary tract is your body's drainage system for removing wastes and extra water. Your urinary tract includes two kidneys, two ureters, a bladder, and a urethra. Your kidneys are a pair of bean-shaped organs. Each kidney is about the size of your fist. They are located below your ribs, one on each side of your spine.  CAUSES  Infections are caused by microbes, which are microscopic organisms, including fungi, viruses, and bacteria. These organisms are so small that they can only be seen through a microscope. Bacteria are the microbes that most commonly cause UTIs.  SYMPTOMS   Symptoms of UTIs may vary by age and gender of the patient and by the location of the infection. Symptoms in young women typically include a frequent and intense urge to urinate and a painful, burning feeling in the bladder or urethra during urination. Older women and men are more likely to be tired, shaky, and weak and have muscle aches and abdominal pain. A fever may mean the infection is in your kidneys. Other symptoms of a kidney infection include pain in your back or sides below the ribs, nausea, and vomiting.  DIAGNOSIS  To diagnose a UTI, your caregiver will ask you about your symptoms. Your caregiver also will ask to provide a urine sample. The urine sample will be tested for bacteria and white blood cells. White blood cells are made by your body to help fight infection.  TREATMENT   Typically, UTIs can be treated with medication. Because most UTIs are caused by a bacterial infection, they usually can be treated with the use of antibiotics. The choice of antibiotic and length of treatment depend on your symptoms and the type of bacteria causing your infection.  HOME CARE INSTRUCTIONS   If you were prescribed antibiotics, take them exactly as your caregiver instructs you. Finish the medication even if you feel better after you  have only taken some of the medication.   Drink enough water and fluids to keep your urine clear or pale yellow.   Avoid caffeine, tea, and carbonated beverages. They tend to irritate your bladder.   Empty your bladder often. Avoid holding urine for long periods of time.   Empty your bladder before and after sexual intercourse.   After a bowel movement, women should cleanse from front to back. Use each tissue only once.  SEEK MEDICAL CARE IF:    You have back pain.   You develop a fever.   Your symptoms do not begin to resolve within 3 days.  SEEK IMMEDIATE MEDICAL CARE IF:    You have severe back pain or lower abdominal pain.   You develop chills.   You have nausea or vomiting.   You have continued burning or discomfort with urination.  MAKE SURE YOU:    Understand these instructions.   Will watch your condition.   Will get help right away if you are not doing well or get worse.  Document Released: 04/18/2005 Document Revised: 01/08/2012 Document Reviewed: 08/17/2011  [employer] Patient Information 2014 [employer].

## 2013-10-09 NOTE — Progress Notes (Signed)
   Subjective:    Patient ID: Sharon Hoover, female    DOB: January 30, 1956, 58 y.o.   MRN: [id number]  HPI Patient is a 58 year old female who presents to the clinic with increased urination frequency as well as some tingling when she urinates. Symptoms have been present for the last 2 days. She's been nothing to make better and the symptoms seem to be stable. She denies any fever, chills, nausea, vomiting, diarrhea or, pain. She denies any vaginal discharge or itching. She has had some pain in her right mid back that radiates into the right side of her ribs. It does not seem to be related to urinary issues. She's been nothing to make this better or worse. She denies any heavy lifting or stretching. She denies any trauma to the area.    Review of Systems     Objective:   Physical Exam  Constitutional: She is oriented to person, place, and time. She appears well-developed and well-nourished.  Obese.  HENT:  Head: Normocephalic and atraumatic.  Cardiovascular: Regular rhythm and normal heart sounds.   Tachycardia at 105.   Pulmonary/Chest: Effort normal and breath sounds normal. She has no wheezes.  No CVA tenderness.   Abdominal: Soft. Bowel sounds are normal. She exhibits no distension and no mass. There is no tenderness. There is no guarding.  Genitourinary: Vagina normal. No vaginal discharge found.  No erythema, discharge or lesions present.   Musculoskeletal:  There was some tenderness over right latissimus dorsi to palpation. ROM of right arm normal. Pain in over right ribs and into right mid back made worse with abduction of right arm. No rash.   Neurological: She is alert and oriented to person, place, and time.  Psychiatric: She has a normal mood and affect. Her behavior is normal.          Assessment & Plan:  UTI- discussed with patient no abnormalities with vaginal exam. UA positive for small blood and trace leuks however urine sample was very small. Will treat for UTI  with cipro for 3 days. Diflucan given for yeast if develops. HO given for symptomatic care. Follow up if not improving. Stay hydrated. Not enough sample for culture.   Right upper rib to back pain- I do not think due to UTI. Suspect separate muscluloskelatal issue. On daily mobic. Consider warm and cold compresses. Discussed stretches. Follow up if still present in 2 weeks.

## 2013-10-12 ENCOUNTER — Telehealth: Payer: Self-pay | Admitting: *Deleted

## 2013-10-12 ENCOUNTER — Other Ambulatory Visit: Payer: Self-pay | Admitting: *Deleted

## 2013-10-12 MED ORDER — CIPROFLOXACIN HCL 500 MG PO TABS: 500.0000 mg | ORAL_TABLET | Freq: Two times a day (BID) | ORAL | Status: DC

## 2013-10-12 NOTE — Telephone Encounter (Signed)
Ok for refill? 
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
## 2013-10-12 NOTE — Telephone Encounter (Signed)
cipro resent to pharm.

## 2013-10-20 ENCOUNTER — Other Ambulatory Visit: Payer: Self-pay | Admitting: Family Medicine

## 2013-11-03 ENCOUNTER — Other Ambulatory Visit: Payer: Self-pay | Admitting: Physician Assistant

## 2013-11-18 ENCOUNTER — Other Ambulatory Visit: Payer: Self-pay | Admitting: Physician Assistant

## 2013-12-04 ENCOUNTER — Encounter: Payer: Self-pay | Admitting: Physician Assistant

## 2013-12-04 ENCOUNTER — Ambulatory Visit (INDEPENDENT_AMBULATORY_CARE_PROVIDER_SITE_OTHER): Payer: BC Managed Care – PPO | Admitting: Physician Assistant

## 2013-12-04 VITALS — BP 138/67 | HR 73 | Ht 65.0 in | Wt 263.0 lb

## 2013-12-04 DIAGNOSIS — G4733 Obstructive sleep apnea (adult) (pediatric): Secondary | ICD-10-CM

## 2013-12-04 DIAGNOSIS — R7303 Prediabetes: Secondary | ICD-10-CM

## 2013-12-04 DIAGNOSIS — G4700 Insomnia, unspecified: Secondary | ICD-10-CM | POA: Insufficient documentation

## 2013-12-04 DIAGNOSIS — R7309 Other abnormal glucose: Secondary | ICD-10-CM

## 2013-12-04 DIAGNOSIS — R5381 Other malaise: Secondary | ICD-10-CM

## 2013-12-04 DIAGNOSIS — R350 Frequency of micturition: Secondary | ICD-10-CM

## 2013-12-04 DIAGNOSIS — Z79899 Other long term (current) drug therapy: Secondary | ICD-10-CM

## 2013-12-04 DIAGNOSIS — R5383 Other fatigue: Secondary | ICD-10-CM | POA: Insufficient documentation

## 2013-12-04 LAB — POCT GLYCOSYLATED HEMOGLOBIN (HGB A1C): Hemoglobin A1C: 5.7

## 2013-12-04 MED ORDER — LORCASERIN HCL 10 MG PO TABS: 1.0000 | ORAL_TABLET | Freq: Two times a day (BID) | ORAL | Status: DC

## 2013-12-04 MED ORDER — AMITRIPTYLINE HCL 50 MG PO TABS: ORAL_TABLET | ORAL | Status: DC

## 2013-12-04 NOTE — Progress Notes (Signed)
   Subjective:    Patient ID: Sharon Hoover, female    DOB: April 20, 1956, [age] y.o.   MRN: [id number]  HPI Patient is a 58 year old female who presents to the clinic with ongoing fatigue. She admits to not sleeping well at night. She continues to sleep in the liver there for the last 6 months because she has no other bed available in the house. She should be get the bedroom shortly when her [place] leaves for [place]. She's having a lot of problems going to sleep. Patient was approved for CPAP but has not been able to use it for the last months due to not having the bedroom. She did call the supply office and is supposed to be getting his CPAP to her house shortly. She is also concerned because she feels like she urinates frequently. In the past she's had a A1c in the prediabetes range and wonders if this has progressed. She has limited her diet sodas from 8 day to 3 a day. She is trying to drink more water in the soda replacement. The patient would love to lose weight he fulfills to seated and like there's nothing she can do to lose weight.    Review of Systems  All other systems reviewed and are negative.      Objective:   Physical Exam  Constitutional: She is oriented to person, place, and time. She appears well-developed and well-nourished.  Obese.   HENT:  Head: Normocephalic and atraumatic.  Cardiovascular: Normal rate, regular rhythm and normal heart sounds.   Pulmonary/Chest: Effort normal and breath sounds normal.  Neurological: She is alert and oriented to person, place, and time.  Skin: Skin is dry.  Psychiatric: She has a normal mood and affect. Her behavior is normal.          Assessment & Plan:   Fatigue/pre-diabetes/insomnia/OSA/obesity- .Marland Kitchen. Lab Results  Component Value Date   HGBA1C 5.7 12/04/2013   reassured patient that A1c was on the prediabetes range and stable. Continue to make diet and exercise changes to not let diabetes progress but looks fine today. I do  think there is a big component of not sleeping well affecting fatigue. Urged patient to get started back on CPAP nightly to see if her energy level increases. Encouraged patient to stay active and exercise he also help with her energy level. Discuss medication belviq with pt today. She would like to try it. SE discussed. Hopefully this will help with her cravings. Coupon card and free 14 day sample given.Since she is not able to sleep at night and started patient on Elavil 50 mg one hour before bed. Patient also has some ongoing pain that this could help with. Followup in 4-6 weeks to discuss progress.

## 2013-12-04 NOTE — Patient Instructions (Signed)
Continue work on getting CPAP back up.  Start elavil  At night.

## 2013-12-09 ENCOUNTER — Telehealth: Payer: Self-pay | Admitting: *Deleted

## 2013-12-09 NOTE — Telephone Encounter (Signed)
PA approved for Belviq.  Auth approval dates 12/07/13-06/04/14.

## 2013-12-12 ENCOUNTER — Other Ambulatory Visit: Payer: Self-pay | Admitting: Physician Assistant

## 2013-12-15 ENCOUNTER — Other Ambulatory Visit: Payer: Self-pay | Admitting: Physician Assistant

## 2013-12-15 ENCOUNTER — Other Ambulatory Visit: Payer: Self-pay | Admitting: Family Medicine

## 2013-12-23 ENCOUNTER — Other Ambulatory Visit: Payer: Self-pay | Admitting: Physician Assistant

## 2013-12-31 ENCOUNTER — Encounter: Payer: Self-pay | Admitting: Sports Medicine

## 2013-12-31 ENCOUNTER — Ambulatory Visit (INDEPENDENT_AMBULATORY_CARE_PROVIDER_SITE_OTHER): Payer: BC Managed Care – PPO | Admitting: Sports Medicine

## 2013-12-31 VITALS — BP 146/83 | HR 78 | Ht 65.0 in | Wt 261.0 lb

## 2013-12-31 DIAGNOSIS — M6530 Trigger finger, unspecified finger: Secondary | ICD-10-CM

## 2013-12-31 NOTE — Progress Notes (Signed)
   Subjective:    I'm seeing this patient as a consultation for:  Sharon Gaw, PA-C  CC: Trigger finger  HPI: This is a very pleasant 58 year old female, for the past several months she's had pain she localizes in the lower aspect of the left fourth metacarpal phalangeal joint, with occasional locking of the finger flexion. She works as a Firefighter, and also does some crocheting. Pain is moderate, persistent. She's used over-the-counter NSAIDs without improvement. She does have a history of a left trigger thumb in the past that responded well to intermittent immobilization. This is now asymptomatic.  Past medical history, Surgical history, Family history not pertinant except as noted below, Social history, Allergies, and medications have been entered into the medical record, reviewed, and no changes needed.   Review of Systems: No headache, visual changes, nausea, vomiting, diarrhea, constipation, dizziness, abdominal pain, skin rash, fevers, chills, night sweats, weight loss, swollen lymph nodes, body aches, joint swelling, muscle aches, chest pain, shortness of breath, mood changes, visual or auditory hallucinations.   Objective:   General: Well Developed, well nourished, and in no acute distress.  Neuro/Psych: Alert and oriented x3, extra-ocular muscles intact, able to move all 4 extremities, sensation grossly intact. Skin: Warm and dry, no rashes noted.  Respiratory: Not using accessory muscles, speaking in full sentences, trachea midline.  Cardiovascular: Pulses palpable, no extremity edema. Abdomen: Does not appear distended. Left hand: There is a palpable flexor tendon nodule at the left fourth digit and I can feel a nodule just proximal to the A1 pulley at the metacarpophalangeal joint.  Procedure: Real-time Ultrasound Guided Injection of left fourth flexor tendon sheath Device: GE Logiq E  Verbal informed consent obtained.  Time-out conducted.  Noted no overlying erythema,  induration, or other signs of local infection.  Skin prepped in a sterile fashion.  Local anesthesia: Topical Ethyl chloride.  With sterile technique and under real time ultrasound guidance: 25-gauge needle advanced into the flexor tendon sheath near the A1 pulley, a total of 1 cc Kenalog 40, 3 cc lidocaine injected easily.  Completed without difficulty  Pain immediately resolved suggesting accurate placement of the medication.  Advised to call if fevers/chills, erythema, induration, drainage, or persistent bleeding.  Images permanently stored and available for review in the ultrasound unit.  Impression: Technically successful ultrasound guided injection.  Impression and Recommendations:   This case required medical decision making of moderate complexity.

## 2013-12-31 NOTE — Assessment & Plan Note (Signed)
Left fourth flexor tendon sheath injection as above. Return in a month, she did have a trigger thumb, we can do a flexor pollicis longus tendon sheath injection at the next visit if symptoms return.

## 2014-01-19 ENCOUNTER — Other Ambulatory Visit: Payer: Self-pay | Admitting: Physician Assistant

## 2014-01-19 ENCOUNTER — Telehealth: Payer: Self-pay | Admitting: *Deleted

## 2014-01-19 NOTE — Telephone Encounter (Signed)
Called and lvm informing pt that she will need to come in to discuss treatment options. Lesly RubensteinJade stated that she will refer her to the counselors downstairs or to someone outside if she would like advised her to call back and schedule an appt to discuss.Loralee PacasBarkley, [place]

## 2014-02-03 ENCOUNTER — Other Ambulatory Visit: Payer: Self-pay | Admitting: Physician Assistant

## 2014-02-11 ENCOUNTER — Ambulatory Visit (INDEPENDENT_AMBULATORY_CARE_PROVIDER_SITE_OTHER): Payer: BC Managed Care – PPO | Admitting: Family Medicine

## 2014-02-11 ENCOUNTER — Encounter: Payer: Self-pay | Admitting: Family Medicine

## 2014-02-11 VITALS — BP 155/84 | HR 72 | Ht 65.0 in | Wt 259.0 lb

## 2014-02-11 DIAGNOSIS — R0789 Other chest pain: Secondary | ICD-10-CM | POA: Diagnosis not present

## 2014-02-11 NOTE — Patient Instructions (Signed)

## 2014-02-11 NOTE — Progress Notes (Signed)
Subjective:    Patient ID: Sharon Hoover, female    DOB: [birth year], 58 y.o.   MRN: [id number]  HPI CP on an off x 2 weeks across entire chest.  Says no radiation into the arm. Tired more than usuall. Denies any increased stress but admits hasn't been taking her meds as regularly.  She plans on getting back in with her psychiatrist soon.  Maybe some SOB. Sometimes feels like a burning sensation in her chest. Sometime radiates into er back.  Usually tylenol for pain relief and it doesn't seem to help.  No relationship to eating. No cause or change bowels.    Review of Systems BP 155/84  Pulse 72  Ht 5\' 5"  (1.651 m)  Wt 259 lb (117.482 kg)  BMI 43.10 kg/m2  SpO2 96%    Allergies  Allergen Reactions  . Erythromycin     REACTION: GI Upset, has taken Zpak with no side effects  . Grapefruit Diet Or [Extra Strength Grapefruit]   . Miconazole Nitrate     burning  . Other     peanuts  . Pravastatin Nausea Only    Past Medical History  Diagnosis Date  . Impaired fasting glucose   . Hypertension   . Hyperlipidemia   . Bipolar 2 disorder     pscyh hospitaliztion 2006  . Stress incontinence, female   . Obesity   . DDD (degenerative disc disease), lumbar     uses tens unit  . Hernia     abdominal incisional  . Hypercholesteremia   . Herniated disc     Past Surgical History  Procedure Laterality Date  . Total abdominal hysterectomy w/ bilateral salpingoophorectomy  2001    for ovarian cyst  . Cholecystectomy    . Abdominal hysterectomy    . Bladder repair    . Thyroidectomy    . Bladder repair    . Thyroidectomy      History   Social History  . Marital Status: Married    Spouse Name: N/A    Number of Children: N/A  . Years of Education: N/A   Occupational History  . Not on file.   Social History Main Topics  . Smoking status: Former Smoker    Quit date: 07/23/1994  . Smokeless tobacco: Not on file  . Alcohol Use: Yes     Comment: very rarely  . Drug  Use: No  . Sexual Activity: Yes    Birth Control/ Protection: Surgical   Other Topics Concern  . Not on file   Social History Narrative  . No narrative on file    Family History  Problem Relation Age of Onset  . Cancer Mother 8    breast  . Other Father     AMI  . Hyperlipidemia Father   . Hypertension Father   . Heart attack Father   . Depression Sister   . Cancer Other 80    breast            Objective:   Physical Exam  Constitutional: She is oriented to person, place, and time. She appears well-developed and well-nourished.  HENT:  Head: Normocephalic and atraumatic.  Neck: Neck supple. No thyromegaly present.  Cardiovascular: Normal rate, regular rhythm and normal heart sounds.   Pulmonary/Chest: Effort normal and breath sounds normal. She exhibits tenderness.  Upper chest wall tenderness in the center of the sternum.  Abdominal: Soft. Bowel sounds are normal. She exhibits no distension and no mass. There is  no tenderness. There is no rebound and no guarding.  Lymphadenopathy:    She has no cervical adenopathy.  Neurological: She is alert and oriented to person, place, and time.  Skin: Skin is warm and dry.  Psychiatric: She has a normal mood and affect. Her behavior is normal.          Assessment & Plan:  Atypical chest pain-EKG shows rate of 67 beats per minute, normal axis, normal sinus rhythm no acute ST-T wave changes. Gave reassurance I do not think this is cardiac but we will do some additional blood work to evaluate heart enzymes as well as check thyroid as well as electrolyte abnormalities. Most likely related to GERD or reflux. I think she has esophagitis. She has had her gallbladder removed; so cholecystitis is unlikely. Recommend a trial of PPI over the next week. If it's working well then we'll continue treatment for at least 6-8 weeks. We discussed foods to avoid. Handout provided.

## 2014-02-12 LAB — CBC WITH DIFFERENTIAL/PLATELET
Basophils Absolute: 0 10*3/uL (ref 0.0–0.1)
Basophils Relative: 0 % (ref 0–1)
EOS ABS: 0.2 10*3/uL (ref 0.0–0.7)
EOS PCT: 3 % (ref 0–5)
HCT: 40.1 % (ref 36.0–46.0)
Hemoglobin: 13.7 g/dL (ref 12.0–15.0)
LYMPHS ABS: 2.1 10*3/uL (ref 0.7–4.0)
Lymphocytes Relative: 28 % (ref 12–46)
MCH: 29.3 pg (ref 26.0–34.0)
MCHC: 34.2 g/dL (ref 30.0–36.0)
MCV: 85.9 fL (ref 78.0–100.0)
Monocytes Absolute: 0.5 10*3/uL (ref 0.1–1.0)
Monocytes Relative: 7 % (ref 3–12)
Neutro Abs: 4.6 10*3/uL (ref 1.7–7.7)
Neutrophils Relative %: 62 % (ref 43–77)
Platelets: 229 10*3/uL (ref 150–400)
RBC: 4.67 MIL/uL (ref 3.87–5.11)
RDW: 14.2 % (ref 11.5–15.5)
WBC: 7.4 10*3/uL (ref 4.0–10.5)

## 2014-02-12 LAB — COMPLETE METABOLIC PANEL WITH GFR
ALT: 20 U/L (ref 0–35)
AST: 15 U/L (ref 0–37)
Albumin: 4.5 g/dL (ref 3.5–5.2)
Alkaline Phosphatase: 101 U/L (ref 39–117)
BUN: 16 mg/dL (ref 6–23)
CHLORIDE: 101 meq/L (ref 96–112)
CO2: 26 meq/L (ref 19–32)
Calcium: 9.6 mg/dL (ref 8.4–10.5)
Creat: 0.79 mg/dL (ref 0.50–1.10)
GFR, Est African American: >89 mL/min
GFR, Est Non African American: 84 mL/min
Glucose, Bld: 131 mg/dL — ABNORMAL HIGH (ref 70–99)
Potassium: 3.7 mEq/L (ref 3.5–5.3)
Sodium: 137 mEq/L (ref 135–145)
TOTAL PROTEIN: 6.9 g/dL (ref 6.0–8.3)
Total Bilirubin: 0.4 mg/dL (ref 0.2–1.2)

## 2014-02-12 LAB — TROPONIN I: Troponin I: <0.01 ng/mL (ref <0.06)

## 2014-02-12 LAB — CK TOTAL AND CKMB (NOT AT ARMC)
CK, MB: 1.2 ng/mL (ref 0.0–5.0)
Total CK: 50 U/L (ref 7–177)

## 2014-02-12 LAB — TSH: TSH: 2.12 u[IU]/mL (ref 0.350–4.500)

## 2014-03-03 ENCOUNTER — Other Ambulatory Visit: Payer: Self-pay

## 2014-03-03 MED ORDER — OLMESARTAN-AMLODIPINE-HCTZ 40-10-25 MG PO TABS: 40.0000 | ORAL_TABLET | Freq: Every day | ORAL | Status: DC

## 2014-04-16 ENCOUNTER — Other Ambulatory Visit: Payer: Self-pay | Admitting: Family Medicine

## 2014-04-28 ENCOUNTER — Other Ambulatory Visit: Payer: Self-pay | Admitting: Physician Assistant

## 2014-04-28 DIAGNOSIS — Z1231 Encounter for screening mammogram for malignant neoplasm of breast: Secondary | ICD-10-CM

## 2014-04-29 ENCOUNTER — Ambulatory Visit (INDEPENDENT_AMBULATORY_CARE_PROVIDER_SITE_OTHER): Payer: BC Managed Care – PPO

## 2014-04-29 DIAGNOSIS — Z1231 Encounter for screening mammogram for malignant neoplasm of breast: Secondary | ICD-10-CM

## 2014-06-01 ENCOUNTER — Ambulatory Visit (INDEPENDENT_AMBULATORY_CARE_PROVIDER_SITE_OTHER): Payer: BC Managed Care – PPO | Admitting: Physician Assistant

## 2014-06-01 DIAGNOSIS — I10 Essential (primary) hypertension: Secondary | ICD-10-CM

## 2014-06-02 MED ORDER — OLMESARTAN-AMLODIPINE-HCTZ 40-10-25 MG PO TABS: 40.0000 | ORAL_TABLET | Freq: Every day | ORAL | Status: DC

## 2014-06-02 NOTE — Progress Notes (Signed)
   Subjective:    Patient ID: Sharon RothmanMelissa P Deangelo, female    DOB: 19-Sep-1955, 58 y.o.   MRN: [id number] Left one pack of & tabs of Tribenzor 40-10-25 for pt.  Michaelle Bottomley,CMA HPI    Review of Systems     Objective:   Physical Exam        Assessment & Plan:

## 2014-06-28 ENCOUNTER — Other Ambulatory Visit: Payer: Self-pay | Admitting: Physician Assistant

## 2014-07-22 ENCOUNTER — Other Ambulatory Visit: Payer: Self-pay | Admitting: Physician Assistant

## 2014-08-26 ENCOUNTER — Other Ambulatory Visit: Payer: Self-pay | Admitting: Physician Assistant

## 2014-08-27 ENCOUNTER — Telehealth: Payer: Self-pay | Admitting: Physician Assistant

## 2014-08-27 MED ORDER — OLMESARTAN-AMLODIPINE-HCTZ 40-10-25 MG PO TABS: 1.0000 | ORAL_TABLET | Freq: Every day | ORAL | Status: DC

## 2014-08-27 NOTE — Telephone Encounter (Signed)
Pt called. She wants refill on Tribenzor-30 pills. She has an apt scheduled for 2/8.  Her ph # is [phone]

## 2014-08-30 ENCOUNTER — Ambulatory Visit: Payer: Self-pay | Admitting: Physician Assistant

## 2014-08-31 ENCOUNTER — Ambulatory Visit (INDEPENDENT_AMBULATORY_CARE_PROVIDER_SITE_OTHER): Payer: BLUE CROSS/BLUE SHIELD | Admitting: Physician Assistant

## 2014-08-31 ENCOUNTER — Encounter: Payer: Self-pay | Admitting: Physician Assistant

## 2014-08-31 VITALS — BP 139/70 | HR 69 | Ht 65.0 in | Wt 265.0 lb

## 2014-08-31 DIAGNOSIS — I10 Essential (primary) hypertension: Secondary | ICD-10-CM | POA: Diagnosis not present

## 2014-08-31 DIAGNOSIS — E7800 Pure hypercholesterolemia, unspecified: Secondary | ICD-10-CM

## 2014-08-31 MED ORDER — SIMVASTATIN 40 MG PO TABS: 40.0000 mg | ORAL_TABLET | Freq: Every day | ORAL | Status: DC

## 2014-08-31 MED ORDER — MELOXICAM 15 MG PO TABS: 15.0000 mg | ORAL_TABLET | Freq: Every day | ORAL | Status: DC

## 2014-08-31 MED ORDER — OLMESARTAN-AMLODIPINE-HCTZ 40-10-25 MG PO TABS: 1.0000 | ORAL_TABLET | Freq: Every day | ORAL | Status: DC

## 2014-09-01 NOTE — Progress Notes (Signed)
   Subjective:    Patient ID: Sharon Hoover, female    DOB: April 09, 1956, 59 y.o.   MRN: [id number]  HPI Pt presents to the clinic for medication refills on HTN. Doing well. No CP, palpitations, headaches or vision changes. Taking medication daily.        Review of Systems  All other systems reviewed and are negative.      Objective:   Physical Exam  Constitutional: She is oriented to person, place, and time. She appears well-developed and well-nourished.  HENT:  Head: Normocephalic and atraumatic.  Cardiovascular: Normal rate, regular rhythm and normal heart sounds.   Pulmonary/Chest: Effort normal and breath sounds normal. She has no wheezes.  Neurological: She is alert and oriented to person, place, and time.  Skin: Skin is dry.  Psychiatric: She has a normal mood and affect. Her behavior is normal.          Assessment & Plan:  Hypercholesterolemia- not checked in over a year. Lipid panel given to have drawn when fasting. Needs CPE.   HTN- rechecked BP back down to normal range than first arrival. Refilled tribenzor for 6 months. Discussed losing weight with diet and exercise. Discussed DASH diet.   Obesity- pt not willing to spend the money on long term weight loss medications currently. She is on wellbutrin. Discussed importance of weight control and overall health.   Bipolar- medications are being managed by Dr. Christell [place]ore [place].   Needs CPe.

## 2014-09-17 ENCOUNTER — Other Ambulatory Visit: Payer: Self-pay | Admitting: Physician Assistant

## 2014-09-20 ENCOUNTER — Other Ambulatory Visit: Payer: Self-pay | Admitting: Physician Assistant

## 2014-09-20 ENCOUNTER — Telehealth: Payer: Self-pay | Admitting: *Deleted

## 2014-09-20 MED ORDER — GABAPENTIN 100 MG PO CAPS: 100.0000 mg | ORAL_CAPSULE | Freq: Three times a day (TID) | ORAL | Status: DC

## 2014-09-20 NOTE — Telephone Encounter (Signed)
Call pt: sent neurontin 100mg  start at bedtime and increase every 3 days until at three times a day. Follow up in 1 month to see how symptoms are.

## 2014-09-20 NOTE — Telephone Encounter (Signed)
Pt called stating that she is experiencing extreme nerve pain and that she would like to try gabapentin because it helped her with nerve pain. She has not been seen for nerve in quite some time. Please advise. Minna AntisEbony Brigham, [place]

## 2014-09-21 ENCOUNTER — Other Ambulatory Visit: Payer: Self-pay | Admitting: *Deleted

## 2014-09-21 MED ORDER — ACETAMINOPHEN-CODEINE #3 300-30 MG PO TABS: ORAL_TABLET | ORAL | Status: DC

## 2014-09-21 NOTE — Telephone Encounter (Signed)
Pt notified of rx. 

## 2014-11-16 ENCOUNTER — Other Ambulatory Visit: Payer: Self-pay | Admitting: Physician Assistant

## 2014-11-25 ENCOUNTER — Other Ambulatory Visit: Payer: Self-pay | Admitting: Physician Assistant

## 2015-01-11 ENCOUNTER — Ambulatory Visit: Payer: Self-pay | Admitting: Family Medicine

## 2015-02-14 ENCOUNTER — Telehealth: Payer: Self-pay | Admitting: Physician Assistant

## 2015-02-14 NOTE — Telephone Encounter (Signed)
Received fax for prior authorization on Tribenzor sent through cover my meds and medication is approved from 02/14/2015 - 07/22/2038. - CF

## 2015-02-23 ENCOUNTER — Encounter: Payer: Self-pay | Admitting: Family Medicine

## 2015-02-23 ENCOUNTER — Ambulatory Visit (INDEPENDENT_AMBULATORY_CARE_PROVIDER_SITE_OTHER): Payer: BLUE CROSS/BLUE SHIELD | Admitting: Family Medicine

## 2015-02-23 VITALS — BP 146/75 | HR 72 | Ht 65.0 in | Wt 268.0 lb

## 2015-02-23 DIAGNOSIS — M546 Pain in thoracic spine: Secondary | ICD-10-CM | POA: Diagnosis not present

## 2015-02-23 MED ORDER — ACETAMINOPHEN-CODEINE #3 300-30 MG PO TABS: ORAL_TABLET | ORAL | Status: DC

## 2015-02-23 MED ORDER — CYCLOBENZAPRINE HCL 5 MG PO TABS: 5.0000 mg | ORAL_TABLET | Freq: Every evening | ORAL | Status: DC | PRN

## 2015-02-23 NOTE — Assessment & Plan Note (Signed)
Myofascial pain and spasm. Treat with Tylenol 3 Flexeril home TENS unit and heating pad. Home exercise program. Return if not better.

## 2015-02-23 NOTE — Progress Notes (Signed)
Sharon Hoover is a 59 y.o. female who presents to [facility]  today for a sick back pain. Patient has a several day history of left-sided thoracic back pain. This started after she drove for an extended period of time. This is happening before. She denies any trouble breathing chest pains or palpitations or leg swelling. She takes Tylenol 3 for this which helps a lot. She would like to try a muscle relaxer as well. She states that she has trouble affording physical therapy. Symptoms are moderate and interfering with her normal activities.   Past Medical History  Diagnosis Date  . Impaired fasting glucose   . Hypertension   . Hyperlipidemia   . Bipolar 2 disorder     pscyh hospitaliztion 2006  . Stress incontinence, female   . Obesity   . DDD (degenerative disc disease), lumbar     uses tens unit  . Hernia     abdominal incisional  . Hypercholesteremia   . Herniated disc    Past Surgical History  Procedure Laterality Date  . Total abdominal hysterectomy w/ bilateral salpingoophorectomy  2001    for ovarian cyst  . Cholecystectomy    . Abdominal hysterectomy    . Bladder repair    . Thyroidectomy    . Bladder repair    . Thyroidectomy     History  Substance Use Topics  . Smoking status: Former Smoker    Quit date: 07/23/1994  . Smokeless tobacco: Not on file  . Alcohol Use: Yes     Comment: very rarely   ROS as above Medications: Current Outpatient Prescriptions  Medication Sig Dispense Refill  . acetaminophen-codeine (TYLENOL #3) 300-30 MG per tablet take 1 tablet by mouth every 6 hours if needed for pain 60 tablet 1  . buPROPion (WELLBUTRIN XL) 300 MG 24 hr tablet Take 1 tablet (300 mg total) by mouth daily. Appointment required for future refills. 30 tablet 0  . econazole nitrate 1 % cream apply to affected area once daily 15 g 1  . gabapentin (NEURONTIN) 100 MG capsule take 1 capsule by mouth three times a day 90 capsule 3  .  lamoTRIgine (LAMICTAL) 100 MG tablet take 2 tablets by mouth at bedtime 60 tablet 5  . meloxicam (MOBIC) 15 MG tablet take 1 tablet by mouth once daily 30 tablet 1  . Olmesartan-Amlodipine-HCTZ (TRIBENZOR) 40-10-25 MG TABS Take 1 tablet by mouth daily. 90 tablet 1  . ranitidine (ZANTAC) 150 MG capsule Take 150 mg by mouth 2 (two) times daily.    . sertraline (ZOLOFT) 100 MG tablet take 1 and 1/2 tablet by mouth once daily 45 tablet 0  . simvastatin (ZOCOR) 40 MG tablet Take 1 tablet (40 mg total) by mouth at bedtime. 90 tablet 3  . traZODone (DESYREL) 50 MG tablet Take 1/2 po daily for 3 days then 1 po daily for three days then 1 1/2 daily for three days then 2 po daily    . Triamcinolone Acetonide (TRIAMCINOLONE 0.1 % CREAM : EUCERIN) CREA Apply 1 application topically 2 (two) times daily. 1 each 0  . cyclobenzaprine (FLEXERIL) 5 MG tablet Take 1-2 tablets (5-10 mg total) by mouth at bedtime as needed for muscle spasms. 30 tablet 1   No current facility-administered medications for this visit.   Allergies  Allergen Reactions  . Erythromycin     REACTION: GI Upset, has taken Zpak with no side effects  . Grapefruit Diet Or Placido Sou Strength  Grapefruit]   . Miconazole Nitrate     burning  . Other     peanuts  . Pravastatin Nausea Only     Exam:  BP 146/75 mmHg  Pulse 72  Ht  (1.651 m)  Wt 268 lb (121.564 kg)  BMI 44.60 kg/m2 Gen: Well NAD HEENT: EOMI,  MMM Lungs: Normal work of breathing. CTABL Heart: RRR no MRG Abd: NABS, Soft. Nondistended, Nontender Exts: Brisk capillary refill, warm and well perfused. No edema bilateral lower extremities. Spine: C-spine and L-spine nontender. T-spine nontender to midline tender palpation left thoracic paraspinal. Normal range of motion.  No results found for this or any previous visit (from the past 24 hour(s)). No results found.   Please see individual assessment and plan sections.

## 2015-02-23 NOTE — Patient Instructions (Signed)
Thank you for coming in today. Come back or go to the emergency room if you notice new weakness new numbness problems walking or bowel or bladder problems. Take the tylenol 3 and flexeril as needed.   Return in 2 weeks if not better.   Myofascial Pain Syndrome Myofascial pain syndrome is a pain disorder. This pain may be felt in the muscles. It may come and go. Myofascial pain syndrome always has trigger or tender points in the muscle that will cause pain when pressed.  CAUSES Myofascial pain may be caused by injuries, especially auto accidents, or by overuse of certain muscles. Typically the pain is long lasting. It is made worse by overuse of the involved muscles, emotional distress, and by cold, damp weather. Myofascial pain syndrome often develops in patients whose response to stress is an increase in muscle tone, and is seen in greater frequency in patients with pre-existing tension headaches. SYMPTOMS  Myofascial pain syndrome causes a wide variety of symptoms. You may see tight ropy bands of muscle. Problems may also include aching, cramping, burning, numbness, tingling, and other uncomfortable sensations in muscular areas. It most commonly affects the neck, upper back, and shoulder areas. Pain often radiates into the arms and hands.  TREATMENT Treatment includes resting the affected muscular area and applying ice packs to reduce spasm and pain. Trigger point injection, is a valuable initial therapy. This therapy is an injection of local anesthetic directly into the trigger point. Trigger points are often present at the source of pain. Pain relief following injection confirms the diagnosis of myofascial pain syndrome. Fairly vigorous therapy can be carried out during the pain-free period after each injection. Stretching exercises to loosen up the muscles are also useful. Transcutaneous electrical nerve stimulation (TENS) may provide relief from pain. TENS is the use of electric current produced  by a device to stimulate the nerves. Ultrasound therapy applied directly over the affected muscle may also provide pain relief. Anti-inflammatory pain medicine can be helpful. Symptoms will gradually improve over a period of weeks to months with proper treatment. HOME CARE INSTRUCTIONS Call your caregiver for follow-up care as recommended.  SEEK MEDICAL CARE IF:  Your pain is severe and not helped with medications. Document Released: 08/16/2004 Document Revised: 10/01/2011 Document Reviewed: 08/25/2010 [facility] Patient Information 2015 [place], [place]. This information is not intended to replace advice given to you by your health care provider. Make sure you discuss any questions you have with your health care provider.

## 2015-03-16 ENCOUNTER — Ambulatory Visit: Payer: Self-pay | Admitting: Sports Medicine

## 2015-04-04 ENCOUNTER — Ambulatory Visit (INDEPENDENT_AMBULATORY_CARE_PROVIDER_SITE_OTHER): Payer: BLUE CROSS/BLUE SHIELD | Admitting: Sports Medicine

## 2015-04-04 ENCOUNTER — Other Ambulatory Visit: Payer: Self-pay | Admitting: Physician Assistant

## 2015-04-04 ENCOUNTER — Ambulatory Visit (INDEPENDENT_AMBULATORY_CARE_PROVIDER_SITE_OTHER): Payer: BLUE CROSS/BLUE SHIELD

## 2015-04-04 ENCOUNTER — Encounter: Payer: Self-pay | Admitting: Sports Medicine

## 2015-04-04 VITALS — BP 135/68 | HR 64 | Ht 65.0 in | Wt 265.0 lb

## 2015-04-04 DIAGNOSIS — M25542 Pain in joints of left hand: Secondary | ICD-10-CM

## 2015-04-04 DIAGNOSIS — M5412 Radiculopathy, cervical region: Secondary | ICD-10-CM

## 2015-04-04 DIAGNOSIS — M6530 Trigger finger, unspecified finger: Secondary | ICD-10-CM | POA: Diagnosis not present

## 2015-04-04 DIAGNOSIS — Z1231 Encounter for screening mammogram for malignant neoplasm of breast: Secondary | ICD-10-CM

## 2015-04-04 DIAGNOSIS — M47892 Other spondylosis, cervical region: Secondary | ICD-10-CM | POA: Diagnosis not present

## 2015-04-04 DIAGNOSIS — M79642 Pain in left hand: Secondary | ICD-10-CM | POA: Diagnosis not present

## 2015-04-04 DIAGNOSIS — M119 Crystal arthropathy, unspecified: Secondary | ICD-10-CM | POA: Insufficient documentation

## 2015-04-04 MED ORDER — PREDNISONE 50 MG PO TABS: ORAL_TABLET | ORAL | Status: DC

## 2015-04-04 NOTE — Assessment & Plan Note (Signed)
X-rays, prednisone, single session of physical therapy to learn exercises. Return in one month, MRI for interventional if no better.

## 2015-04-04 NOTE — Assessment & Plan Note (Signed)
Declines injection. This likely represents a left fourth MCP synovitis. X-rays, prednisone, return in one month, injection if no better

## 2015-04-04 NOTE — Progress Notes (Signed)
   Subjective:    I'm seeing this patient as a consultation for:  Sharon Gaw, PA-C  CC: left hand pain  HPI: This is a pleasant 59 year old female, I saw her approximately 16 months ago and did an ultrasound guided left fourth flexor tendon sheath injection for trigger finger, this has been completely resolved, unfortunately she has developed a new complaint, pain over the dorsum of the left fourth carpophalangeal joint, as well as numbness from the forearm to the dorsum of the left hand, she also has some neck pain. Symptoms are moderate, persistent without radiation, no constitutional symptoms, no bowel or bladder dysfunction.  Past medical history, Surgical history, Family history not pertinant except as noted below, Social history, Allergies, and medications have been entered into the medical record, reviewed, and no changes needed.   Review of Systems: No headache, visual changes, nausea, vomiting, diarrhea, constipation, dizziness, abdominal pain, skin rash, fevers, chills, night sweats, weight loss, swollen lymph nodes, body aches, joint swelling, muscle aches, chest pain, shortness of breath, mood changes, visual or auditory hallucinations.   Objective:   General: Well Developed, well nourished, and in no acute distress.  Neuro/Psych: Alert and oriented x3, extra-ocular muscles intact, able to move all 4 extremities, sensation grossly intact. Skin: Warm and dry, no rashes noted.  Respiratory: Not using accessory muscles, speaking in full sentences, trachea midline.  Cardiovascular: Pulses palpable, no extremity edema. Abdomen: Does not appear distended. Left hand: Visibly swollen, tender to palpation over the dorsum of the fourth metacarpophalangeal joint.  X-rays reviewed personally, and showed interphalangeal joint osteoarthritis,cervical spine x-ray show multilevel cervical degenerative disc disease.  Impression and Recommendations:   This case required medical decision making  of moderate complexity.

## 2015-04-04 NOTE — Assessment & Plan Note (Signed)
Completely resolved and continues to be resolved after left fourth flexor tendon sheath injection 16 months ago.

## 2015-04-11 ENCOUNTER — Ambulatory Visit (INDEPENDENT_AMBULATORY_CARE_PROVIDER_SITE_OTHER): Payer: BLUE CROSS/BLUE SHIELD | Admitting: Rehabilitative and Restorative Service Providers"

## 2015-04-11 ENCOUNTER — Encounter: Payer: Self-pay | Admitting: Rehabilitative and Restorative Service Providers"

## 2015-04-11 DIAGNOSIS — R293 Abnormal posture: Secondary | ICD-10-CM

## 2015-04-11 DIAGNOSIS — M5010 Cervical disc disorder with radiculopathy, unspecified cervical region: Secondary | ICD-10-CM

## 2015-04-11 NOTE — Patient Instructions (Signed)
Axial Extension (Chin Tuck)   Pull chin in and lengthen back of neck. Hold _10-15___ seconds while counting out loud. Repeat _10___ times. Do _several___ sessions per day.      Shoulder Blade Squeeze   Rotate shoulders back, then squeeze shoulder blades down and back Hold 10 sec Repeat __10__ times. Do _several___ sessions per day. Can use swim noodle    Scapula Adduction With Pectorals, Low   Stand in doorframe with palms against frame and arms at 45. Lean forward and squeeze shoulder blades. Hold _30__ seconds. Repeat _3__ times per session. Do _4-5__ sessions per day.     Scapula Adduction With Pectorals, Mid-Range   Stand in doorframe with palms against frame and arms at 90. Lean forward and squeeze shoulder blades. Hold __30_ seconds. Repeat __3_ times per session. Do _4-5__ sessions per day.    \Scapula Adduction With Pectorals, High   Stand in doorframe with palms against frame and arms at 120. Lean forward and squeeze shoulder blades. Hold __30_ seconds. Repeat ___ times per session. Do _4-5__ sessions per day.  Myofacial ball release work ~4 Social worker ball.  Through thoracic (mid back area) shoulder arm and pecs (front of chest)  5-10 min as needed   .
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
## 2015-04-18 ENCOUNTER — Other Ambulatory Visit: Payer: Self-pay | Admitting: Physician Assistant

## 2015-04-29 ENCOUNTER — Ambulatory Visit (INDEPENDENT_AMBULATORY_CARE_PROVIDER_SITE_OTHER): Payer: BLUE CROSS/BLUE SHIELD | Admitting: Sports Medicine

## 2015-04-29 ENCOUNTER — Encounter: Payer: Self-pay | Admitting: Sports Medicine

## 2015-04-29 VITALS — BP 133/62 | HR 70 | Wt 267.0 lb

## 2015-04-29 DIAGNOSIS — M6530 Trigger finger, unspecified finger: Secondary | ICD-10-CM | POA: Diagnosis not present

## 2015-04-29 DIAGNOSIS — M79642 Pain in left hand: Secondary | ICD-10-CM

## 2015-04-29 DIAGNOSIS — M25542 Pain in joints of left hand: Secondary | ICD-10-CM

## 2015-04-29 NOTE — Assessment & Plan Note (Signed)
Left fourth MCP synovitis was injected today, return in one month.

## 2015-04-29 NOTE — Assessment & Plan Note (Signed)
Left fourth flexor tendon sheath injection as above, this provided 17 months of response from the prior injection.

## 2015-04-29 NOTE — Progress Notes (Signed)
  Subjective:    CC: Left hand pain  HPI: This pleasant 59 year old female returns, she had a left fourth trigger finger, I injected this over 1.5 years ago and she had a fantastic response with a recurrence only recently. She also has some pain that she localizes at the fourth metacarpal phalangeal joint, consistent with a synovitis at the last visit, but declined injection, today her pain is persistent and she requests repeat interventional treatment. Pain is moderate, no radiation. She does have some triggering of the fourth finger.  Past medical history, Surgical history, Family history not pertinant except as noted below, Social history, Allergies, and medications have been entered into the medical record, reviewed, and no changes needed.   Review of Systems: No fevers, chills, night sweats, weight loss, chest pain, or shortness of breath.   Objective:    General: Well Developed, well nourished, and in no acute distress.  Neuro: Alert and oriented x3, extra-ocular muscles intact, sensation grossly intact.  HEENT: Normocephalic, atraumatic, pupils equal round reactive to light, neck supple, no masses, no lymphadenopathy, thyroid nonpalpable.  Skin: Warm and dry, no rashes. Cardiac: Regular rate and rhythm, no murmurs rubs or gallops, no lower extremity edema.  Respiratory: Clear to auscultation bilaterally. Not using accessory muscles, speaking in full sentences. Hand: Tender to palpation at the fourth metacarpophalangeal joint with a palpable synovitis, there is also a palpable flexor tendon nodule and triggering proximal to the A1 pulley.  Procedure: Real-time Ultrasound Guided Injection of left fourth flexor digitorum tendon sheath Device: GE Logiq E  Verbal informed consent obtained.  Time-out conducted.  Noted no overlying erythema, induration, or other signs of local infection.  Skin prepped in a sterile fashion.  Local anesthesia: Topical Ethyl chloride.  With sterile technique  and under real time ultrasound guidance:  25-gauge needle advanced into the tendon sheath and 1 mL kenalog 40, 1 mL lidocaine injected easily. Completed without difficulty  Pain immediately resolved suggesting accurate placement of the medication.  Advised to call if fevers/chills, erythema, induration, drainage, or persistent bleeding.  Images permanently stored and available for review in the ultrasound unit.  Impression: Technically successful ultrasound guided injection.  Procedure: Real-time Ultrasound Guided Injection of left fourth metacarpal phalangeal joint Device: GE Logiq E  Verbal informed consent obtained.  Time-out conducted.  Noted no overlying erythema, induration, or other signs of local infection.  Skin prepped in a sterile fashion.  Local anesthesia: Topical Ethyl chloride.  With sterile technique and under real time ultrasound guidance:  After the previous injection the needle was redirected into the metacarpal phalangeal joint from a volar approach, and I injected 1/2 mL Kenalog 40, 1/2 mL lidocaine. Completed without difficulty  Pain immediately resolved suggesting accurate placement of the medication.  Advised to call if fevers/chills, erythema, induration, drainage, or persistent bleeding.  Images permanently stored and available for review in the ultrasound unit.  Impression: Technically successful ultrasound guided injection.  Impression and Recommendations:

## 2015-05-03 ENCOUNTER — Ambulatory Visit: Payer: Self-pay | Admitting: Sports Medicine

## 2015-05-11 ENCOUNTER — Ambulatory Visit: Payer: Self-pay

## 2015-05-19 ENCOUNTER — Ambulatory Visit (INDEPENDENT_AMBULATORY_CARE_PROVIDER_SITE_OTHER): Payer: BLUE CROSS/BLUE SHIELD

## 2015-05-19 DIAGNOSIS — Z1231 Encounter for screening mammogram for malignant neoplasm of breast: Secondary | ICD-10-CM

## 2015-05-23 ENCOUNTER — Other Ambulatory Visit: Payer: Self-pay | Admitting: Physician Assistant

## 2015-05-30 ENCOUNTER — Ambulatory Visit: Payer: Self-pay | Admitting: Sports Medicine

## 2015-06-22 ENCOUNTER — Other Ambulatory Visit: Payer: Self-pay | Admitting: Physician Assistant

## 2015-06-23 ENCOUNTER — Other Ambulatory Visit: Payer: Self-pay | Admitting: Physician Assistant

## 2015-06-27 ENCOUNTER — Encounter: Payer: Self-pay | Admitting: Physician Assistant

## 2015-06-27 ENCOUNTER — Ambulatory Visit (INDEPENDENT_AMBULATORY_CARE_PROVIDER_SITE_OTHER): Payer: BLUE CROSS/BLUE SHIELD | Admitting: Physician Assistant

## 2015-06-27 VITALS — BP 148/78 | HR 72 | Ht 65.0 in | Wt 270.0 lb

## 2015-06-27 DIAGNOSIS — I10 Essential (primary) hypertension: Secondary | ICD-10-CM | POA: Diagnosis not present

## 2015-06-27 DIAGNOSIS — Z131 Encounter for screening for diabetes mellitus: Secondary | ICD-10-CM

## 2015-06-27 DIAGNOSIS — E785 Hyperlipidemia, unspecified: Secondary | ICD-10-CM | POA: Diagnosis not present

## 2015-06-27 DIAGNOSIS — Z79899 Other long term (current) drug therapy: Secondary | ICD-10-CM

## 2015-06-27 DIAGNOSIS — M5412 Radiculopathy, cervical region: Secondary | ICD-10-CM

## 2015-06-27 MED ORDER — OLMESARTAN-AMLODIPINE-HCTZ 40-10-25 MG PO TABS: ORAL_TABLET | ORAL | Status: DC

## 2015-06-27 MED ORDER — MELOXICAM 15 MG PO TABS: 15.0000 mg | ORAL_TABLET | Freq: Every day | ORAL | Status: DC

## 2015-06-27 NOTE — Progress Notes (Signed)
   Subjective:    Patient ID: Sharon RothmanMelissa P Hoover, female    DOB: 30-Jun-1956, 59 y.o.   MRN: [id number]  HPI  Patient is a 59 year old female who presents to the clinic to follow-up on hypertension. She is completely out of her medicines. They were denied over the weekend and she's been out of them for the past 5 days. She is fairly upset about this. She feels like something bad could happen this weekend with her blood pressure being elevated. She denies any chest pains, palpitations, vision changes or dizziness. She is having headaches. These are known to be caused by her left cervical radiculopathy which Dr. Karie Schwalbe is managing. She would like to have Mobitz refill today. She been taken one ibuprofen a day but does not seem to be doing the trick. Mother comes with her neck, back, knee pain.  Hypercholesterolemia-patient has not had lipids checked in a long while. She will be needing a refill on her cholesterol medicine in the near future.   Review of Systems  All other systems reviewed and are negative.      Objective:   Physical Exam  Constitutional: She is oriented to person, place, and time. She appears well-developed and well-nourished.  HENT:  Head: Normocephalic and atraumatic.  Cardiovascular: Normal rate, regular rhythm and normal heart sounds.   Pulmonary/Chest: Effort normal and breath sounds normal. She has no wheezes.  Neurological: She is alert and oriented to person, place, and time.  Psychiatric: She has a normal mood and affect. Her behavior is normal.          Assessment & Plan:  Hypertension-refilled Tribenzor for 6 months. Ordered CMP to evaluate liver and kidney function. Recheck blood pressure before she left and had decreased as well.  Left cervical radiculopathy/thoracic pain-continue to follow-up with Dr. Karie Schwalbe for further management. I did go ahead and send Mobitz to take daily for overall pain symptoms. Discussed GI side effects and to stop if any epigastric pain. Take  with breakfast. Do not take any other anti-inflammatories with movement.  Hypercholesterolemia-we'll check lipid today will refill medication accordingly.  CMP for medication management.

## 2015-06-29 ENCOUNTER — Ambulatory Visit: Payer: Self-pay | Admitting: Physician Assistant

## 2015-06-29 LAB — COMPLETE METABOLIC PANEL WITH GFR
ALBUMIN: 4.4 g/dL (ref 3.6–5.1)
ALK PHOS: 98 U/L (ref 33–130)
ALT: 21 U/L (ref 6–29)
AST: 17 U/L (ref 10–35)
BUN: 12 mg/dL (ref 7–25)
CALCIUM: 9.6 mg/dL (ref 8.6–10.4)
CHLORIDE: 105 mmol/L (ref 98–110)
CO2: 26 mmol/L (ref 20–31)
Creat: 0.87 mg/dL (ref 0.50–1.05)
GFR, EST NON AFRICAN AMERICAN: 74 mL/min (ref >=60)
GFR, Est African American: 85 mL/min (ref >=60)
Glucose, Bld: 107 mg/dL — ABNORMAL HIGH (ref 65–99)
POTASSIUM: 4.2 mmol/L (ref 3.5–5.3)
SODIUM: 141 mmol/L (ref 135–146)
Total Bilirubin: 0.5 mg/dL (ref 0.2–1.2)
Total Protein: 6.9 g/dL (ref 6.1–8.1)

## 2015-06-29 LAB — LIPID PANEL
CHOL/HDL RATIO: 2.5 ratio (ref <=5.0)
CHOLESTEROL: 161 mg/dL (ref 125–200)
HDL: 64 mg/dL (ref >=46)
LDL Cholesterol: 72 mg/dL (ref <130)
Triglycerides: 126 mg/dL (ref <150)
VLDL: 25 mg/dL (ref <30)

## 2015-07-01 ENCOUNTER — Telehealth: Payer: Self-pay

## 2015-07-01 NOTE — Telephone Encounter (Signed)
Patient aware of lab results.  Unable to add A1c because lavender tube was not sent.  Patient will call next week to schedule additional lab work.

## 2015-07-01 NOTE — Telephone Encounter (Signed)
-----   Message from Jomarie LongsJade L Breeback, PA-C sent at 07/01/2015  8:58 AM EST ----- Call pt: LDL looks great. HDL wonderful. TG normal. Glucose a little elevated please add A1C to further evaluate. Liver and kidney looks good.

## 2015-07-06 ENCOUNTER — Ambulatory Visit (INDEPENDENT_AMBULATORY_CARE_PROVIDER_SITE_OTHER): Payer: BLUE CROSS/BLUE SHIELD | Admitting: Physician Assistant

## 2015-07-06 VITALS — BP 133/79 | HR 81

## 2015-07-06 DIAGNOSIS — R7309 Other abnormal glucose: Secondary | ICD-10-CM | POA: Diagnosis not present

## 2015-07-06 LAB — POCT GLYCOSYLATED HEMOGLOBIN (HGB A1C): HEMOGLOBIN A1C: 5.9

## 2015-07-06 NOTE — Progress Notes (Signed)
Patient advised.

## 2015-07-06 NOTE — Progress Notes (Signed)
   Subjective:    Patient ID: Nelma RothmanMelissa P Hearne, female    DOB: [birth year], 59 y.o.   MRN: [id number]  HPI  Efraim KaufmannMelissa is here for HgbA1c check. She has a history of pre diabetes.   Review of Systems     Objective:   Physical Exam        Assessment & Plan:  Pre-diabetes- HgbA1c 5.9 %  Will discuss diet and lifestyle management.

## 2015-09-30 ENCOUNTER — Ambulatory Visit (INDEPENDENT_AMBULATORY_CARE_PROVIDER_SITE_OTHER): Payer: BLUE CROSS/BLUE SHIELD | Admitting: Osteopathic Medicine

## 2015-09-30 ENCOUNTER — Encounter: Payer: Self-pay | Admitting: Osteopathic Medicine

## 2015-09-30 VITALS — BP 135/65 | HR 64 | Ht 65.0 in | Wt 268.0 lb

## 2015-09-30 DIAGNOSIS — R600 Localized edema: Secondary | ICD-10-CM | POA: Diagnosis not present

## 2015-09-30 MED ORDER — FUROSEMIDE 40 MG PO TABS: 20.0000 mg | ORAL_TABLET | Freq: Every day | ORAL | Status: DC | PRN

## 2015-09-30 NOTE — Progress Notes (Signed)
HPI: Sharon RothmanMelissa P Hoover is a 60 y.o. female who [facility] for chief complaint of:  Chief Complaint  Patient presents with  . Joint Swelling    BILATERAL ANKLE, CONCERNED ABOUT BP     . Location: b/l ankles . Quality: swelling . Timing: see below . Context: "usually have swelling" but usually not until later in the day, when weather is more warm, also sitting down more often than usual (previously walked around a lot for work but now doesn't as much  . Assoc signs/symptoms: no SOB/orthopnea, no CP     Past medical, social and family history reviewed: Past Medical History  Diagnosis Date  . Impaired fasting glucose   . Hypertension   . Hyperlipidemia   . Bipolar 2 disorder (HCC)     pscyh hospitaliztion 2006  . Stress incontinence, female   . Obesity   . DDD (degenerative disc disease), lumbar     uses tens unit  . Hernia     abdominal incisional  . Hypercholesteremia   . Herniated disc    Past Surgical History  Procedure Laterality Date  . Total abdominal hysterectomy w/ bilateral salpingoophorectomy  2001    for ovarian cyst  . Cholecystectomy    . Abdominal hysterectomy    . Bladder repair    . Thyroidectomy    . Bladder repair    . Thyroidectomy     Social History  Substance Use Topics  . Smoking status: Former Smoker    Quit date: 07/23/1994  . Smokeless tobacco: Not on file  . Alcohol Use: Yes     Comment: very rarely   Family History  Problem Relation Age of Onset  . Cancer Mother [id number]    breast  . Other Father     AMI  . Hyperlipidemia Father   . Hypertension Father   . Heart attack Father   . Depression Sister   . Cancer Other 80    breast    Current Outpatient Prescriptions  Medication Sig Dispense Refill  . acetaminophen-codeine (TYLENOL #3) 300-30 MG per tablet take 1 tablet by mouth every 6 hours if needed for pain 60 tablet 1  . buPROPion (WELLBUTRIN XL) 300 MG 24 hr tablet Take 1  tablet (300 mg total) by mouth daily. Appointment required for future refills. 30 tablet 0  . econazole nitrate 1 % cream apply to affected area once daily 15 g 1  . gabapentin (NEURONTIN) 100 MG capsule take 1 capsule by mouth three times a day 90 capsule 3  . lamoTRIgine (LAMICTAL) 100 MG tablet take 2 tablets by mouth at bedtime 60 tablet 5  . meloxicam (MOBIC) 15 MG tablet Take 1 tablet (15 mg total) by mouth daily. 30 tablet 5  . Olmesartan-Amlodipine-HCTZ (TRIBENZOR) 40-10-25 MG TABS take 1 tablet by mouth once daily 30 tablet 6  . ranitidine (ZANTAC) 150 MG capsule Take 150 mg by mouth 2 (two) times daily.    . sertraline (ZOLOFT) 100 MG tablet take 1 and 1/2 tablet by mouth once daily 45 tablet 0  . simvastatin (ZOCOR) 40 MG tablet Take 1 tablet (40 mg total) by mouth at bedtime. 90 tablet 3   No current facility-administered medications for this visit.   Allergies  Allergen Reactions  . Erythromycin     REACTION: GI Upset, has taken Zpak with no side effects  . Grapefruit Diet Or [Extra Strength Grapefruit]   . Miconazole Nitrate  burning  . Other     peanuts  . Pravastatin Nausea Only      Review of Systems: CONSTITUTIONAL:  No  fever, no chills, No  unintentional weight changes HEAD/EYES/EARS/NOSE/THROAT: No  headache, no vision change,  CARDIAC: No  chest pain, No  pressure, No palpitations, No  orthopnea RESPIRATORY: No  cough, No  shortness of breath/wheeze MUSCULOSKELETAL: No  myalgia/arthralgia GENITOURINARY: No  incontinence, No  abnormal genital bleeding/discharge SKIN: No  rash/wounds/concerning lesions, skin dry w/ swelling   Exam:  BP 135/65 mmHg  Pulse 64  Ht  (1.651 m)  Wt 268 lb (121.564 kg)  BMI 44.60 kg/m2 Constitutional: VS see above. General Appearance: alert, well-developed, well-nourished, NAD Eyes: Normal lids and conjunctive, non-icteric sclera,  Ears, Nose, Mouth, Throat: MMM, Normal external inspection  ears/nares/mouth/lips/gums, Respiratory: Normal respiratory effort. no wheeze, no rhonchi, no rales Cardiovascular: S1/S2 normal, no murmur, no rub/gallop auscultated. RRR. No carotid bruit or JVD. Pedal pulse II/IV bilaterally DP and PT. Trace lower extremity edema. Musculoskeletal: Gait normal. No clubbing/cyanosis of digits. Homan's neg  Skin: warm, dry, intact. No rash/ulcer. No concerning nevi or subq nodules on limited exam.  No erythema.  Psychiatric: Normal judgment/insight. Normal mood and affect.    No results found for this or any previous visit (from the past 72 hour(s)).  RECORDS REVIEWED:  Vitals with BMI 09/30/2015 07/06/2015 06/27/2015  Height     Weight 268 lbs    BMI 44.7    Systolic 153 133 696  Diastolic 60 79 78  Pulse 64 81   Respirations      CMP: 06/27/2015  Sodium 135 - 146 mmol/L 141   Potassium 3.5 - 5.3 mmol/L 4.2   Chloride 98 - 110 mmol/L 105   CO2 20 - 31 mmol/L 26   Glucose, Bld 65 - 99 mg/dL 295 (H)   BUN 7 - 25 mg/dL 12   Creat 2.84 - 1.32 mg/dL 4.40   Total Bilirubin 0.2 - 1.2 mg/dL 0.5   Alkaline Phosphatase 33 - 130 U/L 98   AST 10 - 35 U/L 17   ALT 6 - 29 U/L 21   Total Protein 6.1 - 8.1 g/dL 6.9   Albumin 3.6 - 5.1 g/dL 4.4   Calcium 8.6 - 10.2 mg/dL 9.6   GFR, Est African American >=60 mL/min 85   GFR, Est Non African American >=60 mL/min 74          ASSESSMENT/PLAN: Likely dependent edema due to age/sedentary lifestyle - advised compression stockings, walking frequently, ER/RTC precautions reviewed, no suspicion at this time for cardiac cause. BP normal on manual recheck.   Bilateral edema of lower extremity - Plan: furosemide (LASIX) 40 MG tablet Patient has been educated on significant possible side effects of medication and is instructed to contact me or other medical professional with any concerns about side effects.   Pt also requested cream she used to have to help skin dryness when legs would swell, she  will have pharmacy send [place] refill request - ok to fill this x 30 day suply and further refills if ok w/ PCP   Return as directed by PCP for routine followup, and come back if symptoms worsen or fail to improve.

## 2015-09-30 NOTE — Patient Instructions (Signed)
Edema °Edema is an abnormal buildup of fluids in your body tissues. Edema is somewhat dependent on gravity to pull the fluid to the lowest place in your body. That makes the condition more common in the legs and thighs (lower extremities). Painless swelling of the feet and ankles is common and becomes more likely as you get older. It is also common in looser tissues, like around your eyes.  °When the affected area is squeezed, the fluid may move out of that spot and leave a dent for a few moments. This dent is called pitting.  °CAUSES  °There are many possible causes of edema. Eating too much salt and being on your feet or sitting for a long time can cause edema in your legs and ankles. Hot weather may make edema worse. Common medical causes of edema include: °· Heart failure. °· Liver disease. °· Kidney disease. °· Weak blood vessels in your legs. °· Cancer. °· An injury. °· Pregnancy. °· Some medications. °· Obesity.  °SYMPTOMS  °Edema is usually painless. Your skin may look swollen or shiny.  °DIAGNOSIS  °Your health care provider may be able to diagnose edema by asking about your medical history and doing a physical exam. You may need to have tests such as X-rays, an electrocardiogram, or blood tests to check for medical conditions that may cause edema.  °TREATMENT  °Edema treatment depends on the cause. If you have heart, liver, or kidney disease, you need the treatment appropriate for these conditions. General treatment may include: °· Elevation of the affected body part above the level of your heart. °· Compression of the affected body part. Pressure from elastic bandages or support stockings squeezes the tissues and forces fluid back into the blood vessels. This keeps fluid from entering the tissues. °· Restriction of fluid and salt intake. °· Use of a water pill (diuretic). These medications are appropriate only for some types of edema. They pull fluid out of your body and make you urinate more often. This  gets rid of fluid and reduces swelling, but diuretics can have side effects. Only use diuretics as directed by your health care provider. °HOME CARE INSTRUCTIONS  °· Keep the affected body part above the level of your heart when you are lying down.   °· Do not sit still or stand for prolonged periods.   °· Do not put anything directly under your knees when lying down. °· Do not wear constricting clothing or garters on your upper legs.   °· Exercise your legs to work the fluid back into your blood vessels. This may help the swelling go down.   °· Wear elastic bandages or support stockings to reduce ankle swelling as directed by your health care provider.   °· Eat a low-salt diet to reduce fluid if your health care provider recommends it.   °· Only take medicines as directed by your health care provider.  °SEEK MEDICAL CARE IF:  °· Your edema is not responding to treatment. °· You have heart, liver, or kidney disease and notice symptoms of edema. °· You have edema in your legs that does not improve after elevating them.   °· You have sudden and unexplained weight gain. °SEEK IMMEDIATE MEDICAL CARE IF:  °· You develop shortness of breath or chest pain.   °· You cannot breathe when you lie down. °· You develop pain, redness, or warmth in the swollen areas.   °· You have heart, liver, or kidney disease and suddenly get edema. °· You have a fever and your symptoms suddenly get worse. °MAKE SURE YOU:  °·   Understand these instructions. °· Will watch your condition. °· Will get help right away if you are not doing well or get worse. °  °This information is not intended to replace advice given to you by your health care provider. Make sure you discuss any questions you have with your health care provider. °  °Document Released: 07/09/2005 Document Revised: 07/30/2014 Document Reviewed: 05/01/2013 °Elsevier Interactive Patient Education ©2016 Elsevier Inc. ° °

## 2015-10-03 ENCOUNTER — Ambulatory Visit: Payer: Self-pay | Admitting: Family Medicine

## 2015-10-21 ENCOUNTER — Encounter: Payer: Self-pay | Admitting: Physician Assistant

## 2015-10-21 ENCOUNTER — Ambulatory Visit (INDEPENDENT_AMBULATORY_CARE_PROVIDER_SITE_OTHER): Payer: BLUE CROSS/BLUE SHIELD | Admitting: Physician Assistant

## 2015-10-21 DIAGNOSIS — R635 Abnormal weight gain: Secondary | ICD-10-CM

## 2015-10-21 DIAGNOSIS — E785 Hyperlipidemia, unspecified: Secondary | ICD-10-CM | POA: Diagnosis not present

## 2015-10-21 MED ORDER — GABAPENTIN 100 MG PO CAPS: ORAL_CAPSULE | ORAL | Status: DC

## 2015-10-21 MED ORDER — PHENTERMINE HCL 37.5 MG PO TABS: 37.5000 mg | ORAL_TABLET | Freq: Every day | ORAL | Status: DC

## 2015-10-21 MED ORDER — SIMVASTATIN 40 MG PO TABS: 40.0000 mg | ORAL_TABLET | Freq: Every day | ORAL | Status: DC

## 2015-10-21 NOTE — Patient Instructions (Addendum)
Phentermine belviq saxenda qsymia   Exercising to Lose Weight Exercising can help you to lose weight. In order to lose weight through exercise, you need to do vigorous-intensity exercise. You can tell that you are exercising with vigorous intensity if you are breathing very hard and fast and cannot hold a conversation while exercising. Moderate-intensity exercise helps to maintain your current weight. You can tell that you are exercising at a moderate level if you have a higher heart rate and faster breathing, but you are still able to hold a conversation. HOW OFTEN SHOULD I EXERCISE? Choose an activity that you enjoy and set realistic goals. Your health care provider can help you to make an activity plan that works for you. Exercise regularly as directed by your health care provider. This may include:  Doing resistance training twice each week, such as:  Push-ups.  Sit-ups.  Lifting weights.  Using resistance bands.  Doing a given intensity of exercise for a given amount of time. Choose from these options:  150 minutes of moderate-intensity exercise every week.  75 minutes of vigorous-intensity exercise every week.  A mix of moderate-intensity and vigorous-intensity exercise every week. Children, pregnant women, people who are out of shape, people who are overweight, and older adults may need to consult a health care provider for individual recommendations. If you have any sort of medical condition, be sure to consult your health care provider before starting a new exercise program. WHAT ARE SOME ACTIVITIES THAT CAN HELP ME TO LOSE WEIGHT?   Walking at a rate of at least 4.5 miles an hour.  Jogging or running at a rate of 5 miles per hour.  Biking at a rate of at least 10 miles per hour.  Lap swimming.  Roller-skating or in-line skating.  Cross-country skiing.  Vigorous competitive sports, such as football, basketball, and soccer.  Jumping rope.  Aerobic dancing. HOW  CAN I BE MORE ACTIVE IN MY DAY-TO-DAY ACTIVITIES?  Use the stairs instead of the elevator.  Take a walk during your lunch break.  If you drive, park your car farther away from work or school.  If you take public transportation, get off one stop early and walk the rest of the way.  Make all of your phone calls while standing up and walking around.  Get up, stretch, and walk around every 30 minutes throughout the day. WHAT GUIDELINES SHOULD I FOLLOW WHILE EXERCISING?  Do not exercise so much that you hurt yourself, feel dizzy, or get very short of breath.  Consult your health care provider prior to starting a new exercise program.  Wear comfortable clothes and shoes with good support.  Drink plenty of water while you exercise to prevent dehydration or heat stroke. Body water is lost during exercise and must be replaced.  Work out until you breathe faster and your heart beats faster.   This information is not intended to replace advice given to you by your health care provider. Make sure you discuss any questions you have with your health care provider.   Document Released: 08/11/2010 Document Revised: 07/30/2014 Document Reviewed: 12/10/2013 Elsevier Interactive Patient Education [employer]2016 Elsevier Inc.

## 2015-10-21 NOTE — Progress Notes (Signed)
   Subjective:    Patient ID: Nelma RothmanMelissa P Murchison, female    DOB: 02-Jun-1956, 60 y.o.   MRN: [id number]  HPI  Pt is a 60 yo female who presents to the clinic to discuss weight. She weighed the other day and was the heaviest she had ever been at 278. She just signed up for [employer], she has started logging calories and carbs she is ready to lose weight but wants help. She has exercised about 15min a day for last week and lost 6lbs.     Review of Systems  All other systems reviewed and are negative.      Objective:   Physical Exam  Constitutional: She is oriented to person, place, and time. She appears well-developed and well-nourished.  Obese.   HENT:  Head: Normocephalic and atraumatic.  Cardiovascular: Normal rate, regular rhythm and normal heart sounds.   Pulmonary/Chest: Effort normal and breath sounds normal.  Neurological: She is alert and oriented to person, place, and time.  Psychiatric: She has a normal mood and affect. Her behavior is normal.          Assessment & Plan:  Morbid obesity/abnormal weight gain- discussed options. On wellbutrin. Started phentermine. Discussed side effects. Discussed 1500 calorie diet and regular exercise. Follow up in 1 month for weight check. Discussed belviq, saxenda for long term weight management. Pt is concerned about cost.    Hyperlipidemia- last checked 3 months ago. Looked great. Refilled today.

## 2015-10-24 DIAGNOSIS — R635 Abnormal weight gain: Secondary | ICD-10-CM | POA: Insufficient documentation

## 2015-10-24 DIAGNOSIS — E785 Hyperlipidemia, unspecified: Secondary | ICD-10-CM | POA: Insufficient documentation

## 2015-11-09 ENCOUNTER — Other Ambulatory Visit: Payer: Self-pay | Admitting: *Deleted

## 2015-11-09 MED ORDER — OLMESARTAN-AMLODIPINE-HCTZ 40-10-25 MG PO TABS: ORAL_TABLET | ORAL | Status: DC

## 2015-11-14 ENCOUNTER — Other Ambulatory Visit: Payer: Self-pay

## 2015-11-14 MED ORDER — OLMESARTAN-AMLODIPINE-HCTZ 40-10-25 MG PO TABS: ORAL_TABLET | ORAL | Status: DC

## 2015-11-17 ENCOUNTER — Other Ambulatory Visit: Payer: Self-pay | Admitting: *Deleted

## 2015-11-17 MED ORDER — OLMESARTAN-AMLODIPINE-HCTZ 40-10-25 MG PO TABS: ORAL_TABLET | ORAL | Status: DC

## 2015-11-22 ENCOUNTER — Ambulatory Visit (INDEPENDENT_AMBULATORY_CARE_PROVIDER_SITE_OTHER): Payer: BLUE CROSS/BLUE SHIELD | Admitting: Physician Assistant

## 2015-11-22 ENCOUNTER — Other Ambulatory Visit: Payer: Self-pay | Admitting: *Deleted

## 2015-11-22 VITALS — BP 143/77 | HR 67 | Wt 264.0 lb

## 2015-11-22 DIAGNOSIS — R635 Abnormal weight gain: Secondary | ICD-10-CM | POA: Diagnosis not present

## 2015-11-22 MED ORDER — PHENTERMINE HCL 37.5 MG PO TABS: 37.5000 mg | ORAL_TABLET | Freq: Every day | ORAL | Status: DC

## 2015-11-22 MED ORDER — GABAPENTIN 100 MG PO CAPS: ORAL_CAPSULE | ORAL | Status: DC

## 2015-11-22 NOTE — Progress Notes (Signed)
Patient is here for blood pressure and weight check. Denies any trouble sleeping, palpitations, or any other medication problems. Patient has lost weight. A refill for Phentermine will be sent to patient preferred pharmacy. Patient advised to schedule a four week nurse visit and keep her upcoming appointment with her PCP. Verbalized understanding, no further questions. 

## 2015-12-27 ENCOUNTER — Ambulatory Visit (INDEPENDENT_AMBULATORY_CARE_PROVIDER_SITE_OTHER): Payer: BLUE CROSS/BLUE SHIELD | Admitting: Physician Assistant

## 2015-12-27 VITALS — BP 142/84 | HR 75 | Wt 225.0 lb

## 2015-12-27 DIAGNOSIS — R635 Abnormal weight gain: Secondary | ICD-10-CM

## 2015-12-27 MED ORDER — PHENTERMINE HCL 37.5 MG PO TABS: 37.5000 mg | ORAL_TABLET | Freq: Every day | ORAL | Status: DC

## 2015-12-27 NOTE — Progress Notes (Signed)
Patient is here for blood pressure and weight check. Denies any trouble sleeping, palpitations, or any other medication problems. Patient has lost weight. A refill for Phentermine will be sent to patient preferred pharmacy. Patient advised to schedule a four week nurse visit and keep her upcoming appointment with her PCP. Verbalized understanding, no further questions. 

## 2015-12-29 ENCOUNTER — Other Ambulatory Visit: Payer: Self-pay | Admitting: *Deleted

## 2015-12-29 MED ORDER — GABAPENTIN 100 MG PO CAPS: ORAL_CAPSULE | ORAL | Status: DC

## 2015-12-30 ENCOUNTER — Ambulatory Visit (INDEPENDENT_AMBULATORY_CARE_PROVIDER_SITE_OTHER): Payer: BLUE CROSS/BLUE SHIELD | Admitting: Physician Assistant

## 2015-12-30 ENCOUNTER — Encounter: Payer: Self-pay | Admitting: Physician Assistant

## 2015-12-30 VITALS — BP 152/65 | HR 83 | Ht 65.0 in | Wt 251.0 lb

## 2015-12-30 DIAGNOSIS — H8113 Benign paroxysmal vertigo, bilateral: Secondary | ICD-10-CM

## 2015-12-30 DIAGNOSIS — J0100 Acute maxillary sinusitis, unspecified: Secondary | ICD-10-CM | POA: Diagnosis not present

## 2015-12-30 MED ORDER — GABAPENTIN 100 MG PO CAPS: ORAL_CAPSULE | ORAL | Status: DC

## 2015-12-30 MED ORDER — MECLIZINE HCL 25 MG PO TABS: 25.0000 mg | ORAL_TABLET | Freq: Three times a day (TID) | ORAL | Status: DC | PRN

## 2015-12-30 MED ORDER — AMOXICILLIN-POT CLAVULANATE 875-125 MG PO TABS: 1.0000 | ORAL_TABLET | Freq: Two times a day (BID) | ORAL | Status: DC

## 2015-12-30 NOTE — Patient Instructions (Signed)
Benign Positional Vertigo Vertigo is the feeling that you or your surroundings are moving when they are not. Benign positional vertigo is the most common form of vertigo. The cause of this condition is not serious (is benign). This condition is triggered by certain movements and positions (is positional). This condition can be dangerous if it occurs while you are doing something that could endanger you or others, such as driving.  CAUSES In many cases, the cause of this condition is not known. It may be caused by a disturbance in an area of the inner ear that helps your brain to sense movement and balance. This disturbance can be caused by a viral infection (labyrinthitis), head injury, or repetitive motion. RISK FACTORS This condition is more likely to develop in:  Women.  People who are 50 years of age or older. SYMPTOMS Symptoms of this condition usually happen when you move your head or your eyes in different directions. Symptoms may start suddenly, and they usually last for less than a minute. Symptoms may include:  Loss of balance and falling.  Feeling like you are spinning or moving.  Feeling like your surroundings are spinning or moving.  Nausea and vomiting.  Blurred vision.  Dizziness.  Involuntary eye movement (nystagmus). Symptoms can be mild and cause only slight annoyance, or they can be severe and interfere with daily life. Episodes of benign positional vertigo may return (recur) over time, and they may be triggered by certain movements. Symptoms may improve over time. DIAGNOSIS This condition is usually diagnosed by medical history and a physical exam of the head, neck, and ears. You may be referred to a health care provider who specializes in ear, nose, and throat (ENT) problems (otolaryngologist) or a provider who specializes in disorders of the nervous system (neurologist). You may have additional testing, including:  MRI.  A CT scan.  Eye movement tests. Your  health care provider may ask you to change positions quickly while he or she watches you for symptoms of benign positional vertigo, such as nystagmus. Eye movement may be tested with an electronystagmogram (ENG), caloric stimulation, the Dix-Hallpike test, or the roll test.  An electroencephalogram (EEG). This records electrical activity in your brain.  Hearing tests. TREATMENT Usually, your health care provider will treat this by moving your head in specific positions to adjust your inner ear back to normal. Surgery may be needed in severe cases, but this is rare. In some cases, benign positional vertigo may resolve on its own in 2-4 weeks. HOME CARE INSTRUCTIONS Safety  Move slowly.Avoid sudden body or head movements.  Avoid driving.  Avoid operating heavy machinery.  Avoid doing any tasks that would be dangerous to you or others if a vertigo episode would occur.  If you have trouble walking or keeping your balance, try using a cane for stability. If you feel dizzy or unstable, sit down right away.  Return to your normal activities as told by your health care provider. Ask your health care provider what activities are safe for you. General Instructions  Take over-the-counter and prescription medicines only as told by your health care provider.  Avoid certain positions or movements as told by your health care provider.  Drink enough fluid to keep your urine clear or pale yellow.  Keep all follow-up visits as told by your health care provider. This is important. SEEK MEDICAL CARE IF:  You have a fever.  Your condition gets worse or you develop new symptoms.  Your family or friends   notice any behavioral changes.  Your nausea or vomiting gets worse.  You have numbness or a "pins and needles" sensation. SEEK IMMEDIATE MEDICAL CARE IF:  You have difficulty speaking or moving.  You are always dizzy.  You faint.  You develop severe headaches.  You have weakness in your  legs or arms.  You have changes in your hearing or vision.  You develop a stiff neck.  You develop sensitivity to light.   This information is not intended to replace advice given to you by your health care provider. Make sure you discuss any questions you have with your health care provider.   Document Released: 04/16/2006 Document Revised: 03/30/2015 Document Reviewed: 11/01/2014 Elsevier Interactive Patient Education 2016 Elsevier Inc.  

## 2015-12-30 NOTE — Progress Notes (Signed)
   Subjective:    Patient ID: Sharon Hoover, female    DOB: 10-19-1955, 60 y.o.   MRN: [id number]  HPI  Pt is a 60 yo female who presents to the clinic with dizziness, nausea, sinus pressure, nasal congestion and fatigue for the last 3-4 days. Her neck also hurts but she has a hx of neck pain and stiffiness. Rates neck pain 6 or 7. She has not been sleeping well. she denies any fever, chills, body aches, ear pain, ST. She has taken some flexeril which has helped some with neck pain. When she moves dizziness is worse.   She is on phentermine but hit or miss when she has taken for last month due to not feeling well.  Didn't take wendesday.      Review of Systems  All other systems reviewed and are negative.      Objective:   Physical Exam  Constitutional: She is oriented to person, place, and time. She appears well-developed and well-nourished.  HENT:  Head: Normocephalic and atraumatic.  Right Ear: External ear normal.  Left Ear: External ear normal.  Mouth/Throat: Oropharynx is clear and moist. No oropharyngeal exudate.  TM's clear bilaterally.  Tenderness over maxillary and frontal sinuses to palpation.  Bilateral nasal turbinates red and swollen.  Bad breath with PND.   Eyes: Conjunctivae are normal. Right eye exhibits no discharge. Left eye exhibits no discharge.  Neck: Normal range of motion. Neck supple.  Cardiovascular: Normal rate, regular rhythm and normal heart sounds.   Pulmonary/Chest: Effort normal and breath sounds normal. She has no wheezes.  Lymphadenopathy:    She has no cervical adenopathy.  Neurological: She is alert and oriented to person, place, and time. No cranial nerve deficit.  Positive dix hallpike with nystagmus bilateral.   Psychiatric: She has a normal mood and affect. Her behavior is normal.          Assessment & Plan:  BPV- cbc ordered to make sure WBC in check. antivert given for nausea and dizziness. epley maneuvers given to do three  times a day. I think sinusitis could be causing BPV. Will treat today. Drink plenty of water. Hold phentermine until feeling better. Follow up if no improvement in next 3-5 days.   Acute maxillary sinusitis- treated with Augmentin for 10 days. flonase 2 sprays each nostril. HO given.

## 2015-12-31 LAB — CBC WITH DIFFERENTIAL/PLATELET
BASOS ABS: 0 {cells}/uL (ref 0–200)
BASOS PCT: 0 %
EOS ABS: 154 {cells}/uL (ref 15–500)
EOS PCT: 2 %
HCT: 41.7 % (ref 35.0–45.0)
HEMOGLOBIN: 14.1 g/dL (ref 11.7–15.5)
LYMPHS ABS: 1848 {cells}/uL (ref 850–3900)
Lymphocytes Relative: 24 %
MCH: 29 pg (ref 27.0–33.0)
MCHC: 33.8 g/dL (ref 32.0–36.0)
MCV: 85.8 fL (ref 80.0–100.0)
MPV: 9.4 fL (ref 7.5–12.5)
Monocytes Absolute: 616 cells/uL (ref 200–950)
Monocytes Relative: 8 %
NEUTROS ABS: 5082 {cells}/uL (ref 1500–7800)
Neutrophils Relative %: 66 %
Platelets: 242 10*3/uL (ref 140–400)
RBC: 4.86 MIL/uL (ref 3.80–5.10)
RDW: 13.5 % (ref 11.0–15.0)
WBC: 7.7 10*3/uL (ref 3.8–10.8)

## 2016-01-27 ENCOUNTER — Other Ambulatory Visit: Payer: Self-pay | Admitting: *Deleted

## 2016-01-27 MED ORDER — ACETAMINOPHEN-CODEINE #3 300-30 MG PO TABS: ORAL_TABLET | ORAL | Status: DC

## 2016-01-28 ENCOUNTER — Other Ambulatory Visit: Payer: Self-pay | Admitting: Physician Assistant

## 2016-02-29 ENCOUNTER — Other Ambulatory Visit: Payer: Self-pay | Admitting: Physician Assistant

## 2016-03-07 ENCOUNTER — Ambulatory Visit: Payer: Self-pay | Admitting: Physician Assistant

## 2016-03-09 ENCOUNTER — Encounter: Payer: Self-pay | Admitting: Physician Assistant

## 2016-03-09 ENCOUNTER — Ambulatory Visit (INDEPENDENT_AMBULATORY_CARE_PROVIDER_SITE_OTHER): Payer: BLUE CROSS/BLUE SHIELD | Admitting: Physician Assistant

## 2016-03-09 DIAGNOSIS — M5137 Other intervertebral disc degeneration, lumbosacral region: Secondary | ICD-10-CM

## 2016-03-09 DIAGNOSIS — M5412 Radiculopathy, cervical region: Secondary | ICD-10-CM | POA: Diagnosis not present

## 2016-03-09 DIAGNOSIS — I10 Essential (primary) hypertension: Secondary | ICD-10-CM | POA: Diagnosis not present

## 2016-03-09 MED ORDER — LORCASERIN HCL 10 MG PO TABS
1.0000 | ORAL_TABLET | Freq: Two times a day (BID) | ORAL | 1 refills | Status: DC
Start: 2016-03-09 — End: 2016-05-25

## 2016-03-09 MED ORDER — MELOXICAM 15 MG PO TABS: 15.0000 mg | ORAL_TABLET | Freq: Every day | ORAL | 5 refills | Status: DC

## 2016-03-09 NOTE — Progress Notes (Signed)
   Subjective:    Patient ID: Sharon Hoover, female    DOB: [birth year], 60 y.o.   MRN: [id number]  HPI Pt presents to the clinic to discuss weight loss. She is down another 3lbs on her own. She does not feel like phentermine is working. She has not been taking. It also causes her to feel jittery and not be able to sleep. She would like to consider other medications.    Review of Systems  All other systems reviewed and are negative.      Objective:   Physical Exam  Constitutional: She is oriented to person, place, and time. She appears well-developed and well-nourished.  Obese.   HENT:  Head: Normocephalic and atraumatic.  Cardiovascular: Normal rate, regular rhythm and normal heart sounds.   Pulmonary/Chest: Effort normal and breath sounds normal.  Neurological: She is alert and oriented to person, place, and time.  Psychiatric: She has a normal mood and affect. Her behavior is normal.          Assessment & Plan:  Morbid obesity- discussed other options other than phentermine. Will try belviq. Discussed side effects. Discussed exercise and diet changes. Follow up in 2 months.   HTN- controlled. Refill as needed.   Left cervical radiculopathy/LDD- pt signed pain contract today. She does not need refill of tylenol 3 at this time. She only takes as needed. UDS was intended but left office without getting. Goal of yearly UDS.

## 2016-03-11 ENCOUNTER — Encounter: Payer: Self-pay | Admitting: Physician Assistant

## 2016-03-15 ENCOUNTER — Telehealth: Payer: Self-pay | Admitting: *Deleted

## 2016-03-15 NOTE — Telephone Encounter (Signed)
Pt left vm stating that the Belviq is going to cost $82/month and she can't afford that right now.  She wants to know if you'd give her just one month of the phentermine again to get her jump started back on the right track with her weight.  Please advise.

## 2016-03-16 ENCOUNTER — Other Ambulatory Visit: Payer: Self-pay | Admitting: *Deleted

## 2016-03-16 MED ORDER — PHENTERMINE HCL 37.5 MG PO TABS: 37.5000 mg | ORAL_TABLET | Freq: Every day | ORAL | 0 refills | Status: DC

## 2016-03-16 NOTE — Telephone Encounter (Signed)
Done

## 2016-03-16 NOTE — Telephone Encounter (Signed)
Ok for one month.

## 2016-04-17 ENCOUNTER — Encounter: Payer: Self-pay | Admitting: Sports Medicine

## 2016-04-17 ENCOUNTER — Ambulatory Visit (INDEPENDENT_AMBULATORY_CARE_PROVIDER_SITE_OTHER): Payer: BLUE CROSS/BLUE SHIELD | Admitting: Sports Medicine

## 2016-04-17 DIAGNOSIS — M79642 Pain in left hand: Secondary | ICD-10-CM

## 2016-04-17 DIAGNOSIS — M25542 Pain in joints of left hand: Secondary | ICD-10-CM

## 2016-04-17 DIAGNOSIS — M65342 Trigger finger, left ring finger: Secondary | ICD-10-CM | POA: Diagnosis not present

## 2016-04-17 DIAGNOSIS — M6530 Trigger finger, unspecified finger: Secondary | ICD-10-CM

## 2016-04-17 NOTE — Assessment & Plan Note (Signed)
One year response to previous injection. Left fourth flexor tendon sheath injection as above

## 2016-04-17 NOTE — Progress Notes (Signed)
  Subjective:    CC: Left hand pain  HPI: This is a pleasant 60 year old female, one year ago we did left fourth flexor tendon sheath and metacarpal phalangeal joint injections. She is having recurrence of pain and desires both injections again.  Past medical history:  Negative.  See flowsheet/record as well for more information.  Surgical history: Negative.  See flowsheet/record as well for more information.  Family history: Negative.  See flowsheet/record as well for more information.  Social history: Negative.  See flowsheet/record as well for more information.  Allergies, and medications have been entered into the medical record, reviewed, and no changes needed.   Review of Systems: No fevers, chills, night sweats, weight loss, chest pain, or shortness of breath.   Objective:    General: Well Developed, well nourished, and in no acute distress.  Neuro: Alert and oriented x3, extra-ocular muscles intact, sensation grossly intact.  HEENT: Normocephalic, atraumatic, pupils equal round reactive to light, neck supple, no masses, no lymphadenopathy, thyroid nonpalpable.  Skin: Warm and dry, no rashes. Cardiac: Regular rate and rhythm, no murmurs rubs or gallops, no lower extremity edema.  Respiratory: Clear to auscultation bilaterally. Not using accessory muscles, speaking in full sentences.  Procedure: Real-time Ultrasound Guided Injection of left fourth MCP Device: GE Logiq E  Verbal informed consent obtained.  Time-out conducted.  Noted no overlying erythema, induration, or other signs of local infection.  Skin prepped in a sterile fashion.  Local anesthesia: Topical Ethyl chloride.  With sterile technique and under real time ultrasound guidance:  1 mL kenalog 40, 1/2 mL lidocaine injected easily through a 30 gauge needle. Completed without difficulty  Pain immediately resolved suggesting accurate placement of the medication.  Advised to call if fevers/chills, erythema, induration,  drainage, or persistent bleeding.  Images permanently stored and available for review in the ultrasound unit.  Impression: Technically successful ultrasound guided injection.  Procedure: Real-time Ultrasound Guided Injection of left fourth flexor tendon sheath Device: GE Logiq E  Verbal informed consent obtained.  Time-out conducted.  Noted no overlying erythema, induration, or other signs of local infection.  Skin prepped in a sterile fashion.  Local anesthesia: Topical Ethyl chloride.  With sterile technique and under real time ultrasound guidance:  Using a 30-gauge needle injected 1/2 mL kenalog 40, 1/2 mL lidocaine into the fourth flexor tendon sheath Completed without difficulty  Pain immediately resolved suggesting accurate placement of the medication.  Advised to call if fevers/chills, erythema, induration, drainage, or persistent bleeding.  Images permanently stored and available for review in the ultrasound unit.  Impression: Technically successful ultrasound guided injection.  Impression and Recommendations:    Left fourth metacarpophalangeal joint pain of left hand One year response to previous injection. Repeat left fourth MCP injection.  Trigger finger, acquired One year response to previous injection. Left fourth flexor tendon sheath injection as above

## 2016-04-17 NOTE — Assessment & Plan Note (Signed)
One year response to previous injection. Repeat left fourth MCP injection.

## 2016-04-20 ENCOUNTER — Other Ambulatory Visit: Payer: Self-pay

## 2016-04-20 MED ORDER — PHENTERMINE HCL 37.5 MG PO TABS: 37.5000 mg | ORAL_TABLET | Freq: Every day | ORAL | 0 refills | Status: DC

## 2016-04-20 NOTE — Telephone Encounter (Signed)
Sharon Hoover was seen on 04/17/2016 and had her blood pressure and weight checked. 109/67 and 243 lbs. Diet and exercise is going well. Denies trouble sleeping or palpitations.

## 2016-05-14 ENCOUNTER — Other Ambulatory Visit: Payer: Self-pay

## 2016-05-14 MED ORDER — LAMOTRIGINE 100 MG PO TABS: 200.0000 mg | ORAL_TABLET | Freq: Every day | ORAL | 5 refills | Status: AC

## 2016-05-21 ENCOUNTER — Encounter: Payer: Self-pay | Admitting: Family Medicine

## 2016-05-21 ENCOUNTER — Ambulatory Visit (INDEPENDENT_AMBULATORY_CARE_PROVIDER_SITE_OTHER): Payer: BLUE CROSS/BLUE SHIELD

## 2016-05-21 ENCOUNTER — Ambulatory Visit (INDEPENDENT_AMBULATORY_CARE_PROVIDER_SITE_OTHER): Payer: BLUE CROSS/BLUE SHIELD | Admitting: Family Medicine

## 2016-05-21 VITALS — BP 129/68 | HR 82

## 2016-05-21 DIAGNOSIS — S90811A Abrasion, right foot, initial encounter: Secondary | ICD-10-CM

## 2016-05-21 DIAGNOSIS — M79674 Pain in right toe(s): Secondary | ICD-10-CM

## 2016-05-21 DIAGNOSIS — M79671 Pain in right foot: Secondary | ICD-10-CM

## 2016-05-21 DIAGNOSIS — S92421A Displaced fracture of distal phalanx of right great toe, initial encounter for closed fracture: Secondary | ICD-10-CM

## 2016-05-21 DIAGNOSIS — W19XXXA Unspecified fall, initial encounter: Secondary | ICD-10-CM | POA: Diagnosis not present

## 2016-05-21 DIAGNOSIS — S92414A Nondisplaced fracture of proximal phalanx of right great toe, initial encounter for closed fracture: Secondary | ICD-10-CM

## 2016-05-21 MED ORDER — ACETAMINOPHEN-CODEINE #3 300-30 MG PO TABS: ORAL_TABLET | ORAL | 1 refills | Status: DC

## 2016-05-21 NOTE — Progress Notes (Signed)
Subjective:    Patient ID: Sharon Hoover, female    DOB: 08-Dec-1955, 60 y.o.   MRN: [id number]  HPI  pt fell this morning down 8 steps and her R foot got caught under her body her great R toe is swollen and she has an abrasion on the top of her foot. she used ice for 20-30 mins and took tylenol #3 @ 930 and 12 today she reports her pain 3/10 at present.   Can't put her weight on it. Her daughter brought her in today and she is using one of our wheelchairs.  Review of Systems  BP 129/68   Pulse 82   SpO2 98%     Allergies  Allergen Reactions  . Erythromycin     REACTION: GI Upset, has taken Zpak with no side effects  . Grapefruit Diet Or [Extra Strength Grapefruit]   . Miconazole Nitrate     burning  . Other     peanuts  . Pravastatin Nausea Only    Past Medical History:  Diagnosis Date  . Bipolar 2 disorder (HCC)    pscyh hospitaliztion 2006  . DDD (degenerative disc disease), lumbar    uses tens unit  . Hernia    abdominal incisional  . Herniated disc   . Hypercholesteremia   . Hyperlipidemia   . Hypertension   . Impaired fasting glucose   . Obesity   . Stress incontinence, female     Past Surgical History:  Procedure Laterality Date  . ABDOMINAL HYSTERECTOMY    . BLADDER REPAIR    . BLADDER REPAIR    . CHOLECYSTECTOMY    . THYROIDECTOMY    . THYROIDECTOMY    . TOTAL ABDOMINAL HYSTERECTOMY W/ BILATERAL SALPINGOOPHORECTOMY  2001   for ovarian cyst    Social History   Social History  . Marital status: Married    Spouse name: N/A  . Number of children: N/A  . Years of education: N/A   Occupational History  . Not on file.   Social History Main Topics  . Smoking status: Former Smoker    Quit date: 07/23/1994  . Smokeless tobacco: Not on file  . Alcohol use Yes     Comment: very rarely  . Drug use: No  . Sexual activity: Yes    Birth control/ protection: Surgical   Other Topics Concern  . Not on file   Social History Narrative  . No  narrative on file    Family History  Problem Relation Age of Onset  . Cancer Mother [id number]    breast  . Other Father     AMI  . Hyperlipidemia Father   . Hypertension Father   . Heart attack Father   . Depression Sister   . Cancer Other 80    breast    Outpatient Encounter Prescriptions as of 05/21/2016  Medication Sig  . acetaminophen-codeine (TYLENOL #3) 300-30 MG tablet take 1 tablet by mouth every 6 hours if needed for pain  . buPROPion (WELLBUTRIN XL) 300 MG 24 hr tablet Take 1 tablet (300 mg total) by mouth daily. Appointment required for future refills.  Marland Kitchen. econazole nitrate 1 % cream apply to affected area once daily  . furosemide (LASIX) 40 MG tablet Take 0.5 tablets (20 mg total) by mouth daily as needed for edema.  . gabapentin (NEURONTIN) 100 MG capsule take 1 capsule by mouth three times a day  . lamoTRIgine (LAMICTAL) 100 MG tablet Take 2 tablets (200 mg total)  by mouth at bedtime.  . Lorcaserin HCl 10 MG TABS Take 1 tablet by mouth 2 (two) times daily.  . meclizine (ANTIVERT) 25 MG tablet Take 1 tablet (25 mg total) by mouth 3 (three) times daily as needed for dizziness.  . meloxicam (MOBIC) 15 MG tablet Take 1 tablet (15 mg total) by mouth daily.  . Olmesartan-Amlodipine-HCTZ (TRIBENZOR) 40-10-25 MG TABS take 1 tablet by mouth once daily  . phentermine (ADIPEX-P) 37.5 MG tablet Take 1 tablet (37.5 mg total) by mouth daily before breakfast.  . ranitidine (ZANTAC) 150 MG capsule Take 150 mg by mouth 2 (two) times daily.  . sertraline (ZOLOFT) 100 MG tablet take 1 and 1/2 tablet by mouth once daily  . simvastatin (ZOCOR) 40 MG tablet Take 1 tablet (40 mg total) by mouth at bedtime.  . traZODone (DESYREL) 50 MG tablet Reported on 10/21/2015   No facility-administered encounter medications on file as of 05/21/2016.          Objective:   Physical Exam  Constitutional: She is oriented to person, place, and time. She appears well-developed and well-nourished.  HENT:   Head: Normocephalic and atraumatic.  Eyes: Conjunctivae and EOM are normal.  Cardiovascular: Normal rate.   Pulmonary/Chest: Effort normal.  Musculoskeletal:  Right great toe is swollen and purple. She also has a fair amount of swelling between the distal ends of the first and second metatarsal heads. She is also very tender over the distal first and second metatarsal heads. She is just able to wiggle the great toe but unable to fully flex.  Neurological: She is alert and oriented to person, place, and time.  Skin: Skin is dry. No pallor.  Psychiatric: She has a normal mood and affect. Her behavior is normal.  Vitals reviewed.      Assessment & Plan:  Right great toe pain - Worrisome for fracture. We'll get x-rays. Patient to come back up after films. It does show a commuted fracture but it is nondisplaced. Will buddy tape great toe and first toe and placed in postop shoe. Follow-up in one week.  Abrasion, right foot-did encourage her to put Vaseline on it to keep it well moisturized. Monitor for infection.

## 2016-05-25 ENCOUNTER — Other Ambulatory Visit: Payer: Self-pay | Admitting: *Deleted

## 2016-05-25 ENCOUNTER — Telehealth: Payer: Self-pay

## 2016-05-25 DIAGNOSIS — S92421S Displaced fracture of distal phalanx of right great toe, sequela: Secondary | ICD-10-CM

## 2016-05-25 MED ORDER — LORCASERIN HCL 10 MG PO TABS: 1.0000 | ORAL_TABLET | Freq: Two times a day (BID) | ORAL | 1 refills | Status: DC

## 2016-05-25 NOTE — Telephone Encounter (Signed)
Ordered xray

## 2016-05-25 NOTE — Telephone Encounter (Signed)
Yes please. Than you

## 2016-05-25 NOTE — Telephone Encounter (Signed)
[facility] called and states there should be an order for a follow up x-ray. Do you want an x-ray of just the great toe? Please advise.

## 2016-05-28 ENCOUNTER — Ambulatory Visit (INDEPENDENT_AMBULATORY_CARE_PROVIDER_SITE_OTHER): Payer: BLUE CROSS/BLUE SHIELD | Admitting: Family Medicine

## 2016-05-28 ENCOUNTER — Ambulatory Visit (INDEPENDENT_AMBULATORY_CARE_PROVIDER_SITE_OTHER): Payer: BLUE CROSS/BLUE SHIELD

## 2016-05-28 ENCOUNTER — Encounter: Payer: Self-pay | Admitting: Family Medicine

## 2016-05-28 VITALS — BP 152/74 | HR 73

## 2016-05-28 DIAGNOSIS — S92421S Displaced fracture of distal phalanx of right great toe, sequela: Secondary | ICD-10-CM

## 2016-05-28 DIAGNOSIS — W19XXXD Unspecified fall, subsequent encounter: Secondary | ICD-10-CM | POA: Diagnosis not present

## 2016-05-28 DIAGNOSIS — M79674 Pain in right toe(s): Secondary | ICD-10-CM

## 2016-05-28 DIAGNOSIS — S92411D Displaced fracture of proximal phalanx of right great toe, subsequent encounter for fracture with routine healing: Secondary | ICD-10-CM

## 2016-05-28 NOTE — Progress Notes (Signed)
Subjective:    CC: toe fracture.   HPI:  F/U fracture of right great toe - Using tylenol #3 for pain PRN.  Wearing a post op shoe.  Pain is 7/10 today.   She says she's been wearing her postop shoe continuously except for when she is in the shower. And she's kept buddy taped even while in the shower because of comfort. If she takes it off is even more painful.  Past medical history, Surgical history, Family history not pertinant except as noted below, Social history, Allergies, and medications have been entered into the medical record, reviewed, and corrections made.   Review of Systems: No fevers, chills, night sweats, weight loss, chest pain, or shortness of breath.   Objective:    General: Well Developed, well nourished, and in no acute distress.  Neuro: Alert and oriented x3, extra-ocular muscles intact, sensation grossly intact.  HEENT: Normocephalic, atraumatic  Skin: Warm and dry, no rashes. Cardiac: Regular rate and rhythm, no murmurs rubs or gallops, no lower extremity edema.  Respiratory: Clear to auscultation bilaterally. Not using accessory muscles, speaking in full sentences.   Impression and Recommendations:    Fracture of right great toe - Fracture is still not displaced at this point. Follow-up in 2 weeks with her primary care provider. Recommend repeat x-ray at that time. Did encourage her to try to elevated a couple times throughout the day. Work from home some. Given copy of xray today.

## 2016-06-11 ENCOUNTER — Ambulatory Visit (INDEPENDENT_AMBULATORY_CARE_PROVIDER_SITE_OTHER): Payer: BLUE CROSS/BLUE SHIELD | Admitting: Physician Assistant

## 2016-06-11 ENCOUNTER — Encounter: Payer: Self-pay | Admitting: Physician Assistant

## 2016-06-11 ENCOUNTER — Telehealth: Payer: Self-pay | Admitting: *Deleted

## 2016-06-11 ENCOUNTER — Ambulatory Visit (INDEPENDENT_AMBULATORY_CARE_PROVIDER_SITE_OTHER): Payer: BLUE CROSS/BLUE SHIELD

## 2016-06-11 VITALS — BP 141/74 | HR 80 | Ht 65.0 in | Wt 243.0 lb

## 2016-06-11 DIAGNOSIS — S92411D Displaced fracture of proximal phalanx of right great toe, subsequent encounter for fracture with routine healing: Secondary | ICD-10-CM | POA: Diagnosis not present

## 2016-06-11 DIAGNOSIS — S92401D Displaced unspecified fracture of right great toe, subsequent encounter for fracture with routine healing: Secondary | ICD-10-CM

## 2016-06-11 DIAGNOSIS — S92421D Displaced fracture of distal phalanx of right great toe, subsequent encounter for fracture with routine healing: Secondary | ICD-10-CM

## 2016-06-11 DIAGNOSIS — S92421A Displaced fracture of distal phalanx of right great toe, initial encounter for closed fracture: Secondary | ICD-10-CM | POA: Insufficient documentation

## 2016-06-11 DIAGNOSIS — W19XXXD Unspecified fall, subsequent encounter: Secondary | ICD-10-CM | POA: Diagnosis not present

## 2016-06-11 NOTE — Telephone Encounter (Signed)
Reviewed with patient in office;

## 2016-06-11 NOTE — Telephone Encounter (Signed)
F/u toe xray ordered.

## 2016-06-11 NOTE — Progress Notes (Signed)
   Subjective:    Patient ID: Sharon Hoover, female    DOB: 03/24/1956, 60 y.o.   MRN: [id number]  HPI  Pt is a 60 yo female who presents to the clinic to follow up on right toe fracture 3 weeks ago on 05/21/16. She is taking regular tylenol and occasional tylenol 3 as needed for more acute pain. Rates pain today 5/10. She is wearing post-op shoe all day long except in the shower.     Review of Systems  All other systems reviewed and are negative.      Objective:   Physical Exam  Constitutional: She is oriented to person, place, and time. She appears well-developed and well-nourished.  HENT:  Head: Normocephalic and atraumatic.  Cardiovascular: Normal rate, regular rhythm and normal heart sounds.   Pulmonary/Chest: Effort normal and breath sounds normal.  Neurological: She is alert and oriented to person, place, and time.  Psychiatric: She has a normal mood and affect. Her behavior is normal.          Assessment & Plan:  Marland Kitchen.Marland Kitchen.Diagnoses and all orders for this visit:  Closed displaced fracture of distal phalanx of right great toe with routine healing, subsequent encounter   Reviewed with Dr. Karie Schwalbe sports medicine physician. Agreed that healing is present although minimal. Stay in post op shoe. Tylenol and tylenol 3 as needed. Elevated and rest as much as she can. Follow up xray in 3 weeks with office visit.

## 2016-06-19 ENCOUNTER — Other Ambulatory Visit: Payer: Self-pay | Admitting: *Deleted

## 2016-06-19 MED ORDER — ECONAZOLE NITRATE 1 % EX CREA: TOPICAL_CREAM | CUTANEOUS | 1 refills | Status: DC

## 2016-06-22 ENCOUNTER — Other Ambulatory Visit: Payer: Self-pay | Admitting: *Deleted

## 2016-06-22 MED ORDER — ECONAZOLE NITRATE 1 % EX CREA: TOPICAL_CREAM | CUTANEOUS | 1 refills | Status: DC

## 2016-07-02 ENCOUNTER — Ambulatory Visit (INDEPENDENT_AMBULATORY_CARE_PROVIDER_SITE_OTHER): Payer: BLUE CROSS/BLUE SHIELD

## 2016-07-02 ENCOUNTER — Encounter: Payer: Self-pay | Admitting: Physician Assistant

## 2016-07-02 ENCOUNTER — Telehealth: Payer: Self-pay | Admitting: *Deleted

## 2016-07-02 ENCOUNTER — Other Ambulatory Visit: Payer: Self-pay | Admitting: Physician Assistant

## 2016-07-02 ENCOUNTER — Ambulatory Visit (INDEPENDENT_AMBULATORY_CARE_PROVIDER_SITE_OTHER): Payer: BLUE CROSS/BLUE SHIELD | Admitting: Physician Assistant

## 2016-07-02 VITALS — BP 113/71 | HR 85 | Ht 65.0 in | Wt 240.0 lb

## 2016-07-02 DIAGNOSIS — S92421D Displaced fracture of distal phalanx of right great toe, subsequent encounter for fracture with routine healing: Secondary | ICD-10-CM

## 2016-07-02 DIAGNOSIS — S92401D Displaced unspecified fracture of right great toe, subsequent encounter for fracture with routine healing: Secondary | ICD-10-CM

## 2016-07-02 DIAGNOSIS — W19XXXD Unspecified fall, subsequent encounter: Secondary | ICD-10-CM | POA: Diagnosis not present

## 2016-07-02 DIAGNOSIS — S92411G Displaced fracture of proximal phalanx of right great toe, subsequent encounter for fracture with delayed healing: Secondary | ICD-10-CM | POA: Diagnosis not present

## 2016-07-02 DIAGNOSIS — Z1239 Encounter for other screening for malignant neoplasm of breast: Secondary | ICD-10-CM

## 2016-07-02 MED ORDER — MELOXICAM 15 MG PO TABS: 15.0000 mg | ORAL_TABLET | Freq: Every day | ORAL | 1 refills | Status: DC

## 2016-07-02 MED ORDER — OLMESARTAN-AMLODIPINE-HCTZ 40-10-25 MG PO TABS: ORAL_TABLET | ORAL | 1 refills | Status: DC

## 2016-07-02 MED ORDER — GABAPENTIN 100 MG PO CAPS: ORAL_CAPSULE | ORAL | 1 refills | Status: DC

## 2016-07-02 NOTE — Telephone Encounter (Signed)
Reviewed in office with patient.

## 2016-07-02 NOTE — Progress Notes (Signed)
   Subjective:    Patient ID: Sharon Hoover, female    DOB: 12-Apr-1956, 60 y.o.   MRN: [id number]  HPI Pt comes in for follow up of fracture of great right toe on 05/21/16. She does report that her pain has improved some. She rates pain 3/10. She takes tylenol 3 on average once a day. She is wearing post op shoe non-stop except for when she showers.    Review of Systems  All other systems reviewed and are negative.      Objective:   Physical Exam  Constitutional: She appears well-developed and well-nourished.  HENT:  Head: Normocephalic and atraumatic.  Cardiovascular: Normal rate, regular rhythm and normal heart sounds.   Musculoskeletal:  Tenderness over the lateral aspect of the proximal great right toe. No bruising or swelling noted.   Psychiatric: She has a normal mood and affect. Her behavior is normal.          Assessment & Plan:  Marland Kitchen.Marland Kitchen.Sharon Hoover was seen today for toe fracture.  Diagnoses and all orders for this visit:  Closed displaced fracture of proximal phalanx of right great toe with delayed healing, subsequent encounter -     Ambulatory referral to Orthopedic Surgery  Other orders -     gabapentin (NEURONTIN) 100 MG capsule; take 1 capsule by mouth three times a day -     meloxicam (MOBIC) 15 MG tablet; Take 1 tablet (15 mg total) by mouth daily. -     Olmesartan-Amlodipine-HCTZ (TRIBENZOR) 40-10-25 MG TABS; take 1 tablet by mouth once daily   Fracture does not appear to be healing from 10/30. Pain does seem to be improving which is positive. Discussed with supervising physician and agreed referral best plan. Stay in post op shoe.

## 2016-07-02 NOTE — Telephone Encounter (Signed)
Toe xray ordered

## 2016-07-10 ENCOUNTER — Other Ambulatory Visit: Payer: Self-pay | Admitting: Physician Assistant

## 2016-07-11 NOTE — Telephone Encounter (Signed)
Ok to fill 

## 2016-07-20 ENCOUNTER — Ambulatory Visit: Payer: Self-pay

## 2016-09-10 ENCOUNTER — Telehealth: Payer: Self-pay | Admitting: *Deleted

## 2016-09-10 DIAGNOSIS — S92411G Displaced fracture of proximal phalanx of right great toe, subsequent encounter for fracture with delayed healing: Secondary | ICD-10-CM

## 2016-09-10 DIAGNOSIS — S92421D Displaced fracture of distal phalanx of right great toe, subsequent encounter for fracture with routine healing: Secondary | ICD-10-CM

## 2016-09-10 NOTE — Telephone Encounter (Signed)
New referral placed.

## 2016-09-13 ENCOUNTER — Ambulatory Visit (INDEPENDENT_AMBULATORY_CARE_PROVIDER_SITE_OTHER): Payer: 59 | Admitting: Sports Medicine

## 2016-09-13 DIAGNOSIS — M79642 Pain in left hand: Secondary | ICD-10-CM

## 2016-09-13 DIAGNOSIS — M25542 Pain in joints of left hand: Secondary | ICD-10-CM

## 2016-09-13 NOTE — Assessment & Plan Note (Signed)
Repeat left fourth MCP injection. Return as needed. She does have a mild trigger finger which will probably improve with systemic effect of the injected steroid into the joint.

## 2016-09-13 NOTE — Progress Notes (Signed)
  Subjective:    CC: Left hand pain  HPI: This is a pleasant 61 year old female with known left fourth metacarpophalangeal joint osteoarthritis, she had an injection 5 months ago, desires repeat. Pain is moderate, persistent, localized without radiation.  Past medical history:  Negative.  See flowsheet/record as well for more information.  Surgical history: Negative.  See flowsheet/record as well for more information.  Family history: Negative.  See flowsheet/record as well for more information.  Social history: Negative.  See flowsheet/record as well for more information.  Allergies, and medications have been entered into the medical record, reviewed, and no changes needed.   Review of Systems: No fevers, chills, night sweats, weight loss, chest pain, or shortness of breath.   Objective:    General: Well Developed, well nourished, and in no acute distress.  Neuro: Alert and oriented x3, extra-ocular muscles intact, sensation grossly intact.  HEENT: Normocephalic, atraumatic, pupils equal round reactive to light, neck supple, no masses, no lymphadenopathy, thyroid nonpalpable.  Skin: Warm and dry, no rashes. Cardiac: Regular rate and rhythm, no murmurs rubs or gallops, no lower extremity edema.  Respiratory: Clear to auscultation bilaterally. Not using accessory muscles, speaking in full sentences. Left hand: Tender to palpation at the fourth MCP.  Procedure: Real-time Ultrasound Guided Injection of left fourth MCP Device: GE Logiq E  Verbal informed consent obtained.  Time-out conducted.  Noted no overlying erythema, induration, or other signs of local infection.  Skin prepped in a sterile fashion.  Local anesthesia: Topical Ethyl chloride.  With sterile technique and under real time ultrasound guidance:  25-gauge needle advanced into the joint and 1/2 mL kenalog 40, 1/2 mL lidocaine injected easily. Completed without difficulty  Pain immediately resolved suggesting accurate  placement of the medication.  Advised to call if fevers/chills, erythema, induration, drainage, or persistent bleeding.  Images permanently stored and available for review in the ultrasound unit.  Impression: Technically successful ultrasound guided injection.  Impression and Recommendations:    Left fourth metacarpophalangeal joint pain of left hand Repeat left fourth MCP injection. Return as needed. She does have a mild trigger finger which will probably improve with systemic effect of the injected steroid into the joint.

## 2016-11-19 ENCOUNTER — Ambulatory Visit (INDEPENDENT_AMBULATORY_CARE_PROVIDER_SITE_OTHER): Payer: 59 | Admitting: Sports Medicine

## 2016-11-19 DIAGNOSIS — M6530 Trigger finger, unspecified finger: Secondary | ICD-10-CM

## 2016-11-19 DIAGNOSIS — M25542 Pain in joints of left hand: Secondary | ICD-10-CM

## 2016-11-19 NOTE — Assessment & Plan Note (Signed)
Doing well after injection in February.

## 2016-11-19 NOTE — Progress Notes (Signed)
  Subjective:    CC: Follow-up  HPI:  Left hand pain: Osteoarthritis pain resolved after injection 2 months ago into the left fourth MCP.  Trigger finger: Last injection was about 8-9 months ago, desires repeat left fourth flexor tendon sheath injection for increasing triggering, moderate, worsening.  Past medical history:  Negative.  See flowsheet/record as well for more information.  Surgical history: Negative.  See flowsheet/record as well for more information.  Family history: Negative.  See flowsheet/record as well for more information.  Social history: Negative.  See flowsheet/record as well for more information.  Allergies, and medications have been entered into the medical record, reviewed, and no changes needed.   Review of Systems: No fevers, chills, night sweats, weight loss, chest pain, or shortness of breath.   Objective:    General: Well Developed, well nourished, and in no acute distress.  Neuro: Alert and oriented x3, extra-ocular muscles intact, sensation grossly intact.  HEENT: Normocephalic, atraumatic, pupils equal round reactive to light, neck supple, no masses, no lymphadenopathy, thyroid nonpalpable.  Skin: Warm and dry, no rashes. Cardiac: Regular rate and rhythm, no murmurs rubs or gallops, no lower extremity edema.  Respiratory: Clear to auscultation bilaterally. Not using accessory muscles, speaking in full sentences. Left hand: Palpable flexor tendon nodule.  Procedure: Real-time Ultrasound Guided Injection of left fourth flexor tendon sheath Device: GE Logiq E  Verbal informed consent obtained.  Time-out conducted.  Noted no overlying erythema, induration, or other signs of local infection.  Skin prepped in a sterile fashion.  Local anesthesia: Topical Ethyl chloride.  With sterile technique and under real time ultrasound guidance: 25-gauge needle advanced just deep to the A1 pulley, 1/2 mL Kenalog 40, 1/2 mL lidocaine injected easily.  Completed  without difficulty  Pain immediately resolved suggesting accurate placement of the medication.  Advised to call if fevers/chills, erythema, induration, drainage, or persistent bleeding.  Images permanently stored and available for review in the ultrasound unit.  Impression: Technically successful ultrasound guided injection.  Impression and Recommendations:    Left fourth metacarpophalangeal joint pain of left hand Doing well after injection in February.  Trigger finger, acquired Last injection was about 7 months ago, repeat fourth flexor tendon sheath injection on the left hand. Return as needed.

## 2016-11-19 NOTE — Assessment & Plan Note (Signed)
Last injection was about 7 months ago, repeat fourth flexor tendon sheath injection on the left hand. Return as needed.

## 2016-12-04 ENCOUNTER — Ambulatory Visit (INDEPENDENT_AMBULATORY_CARE_PROVIDER_SITE_OTHER): Payer: 59 | Admitting: Physician Assistant

## 2016-12-04 ENCOUNTER — Other Ambulatory Visit: Payer: Self-pay | Admitting: Emergency Medicine

## 2016-12-04 ENCOUNTER — Encounter: Payer: Self-pay | Admitting: Physician Assistant

## 2016-12-04 VITALS — BP 132/78 | HR 87 | Ht 65.0 in | Wt 253.0 lb

## 2016-12-04 DIAGNOSIS — G8929 Other chronic pain: Secondary | ICD-10-CM | POA: Diagnosis not present

## 2016-12-04 DIAGNOSIS — M546 Pain in thoracic spine: Secondary | ICD-10-CM | POA: Diagnosis not present

## 2016-12-04 DIAGNOSIS — I10 Essential (primary) hypertension: Secondary | ICD-10-CM | POA: Diagnosis not present

## 2016-12-04 DIAGNOSIS — R7303 Prediabetes: Secondary | ICD-10-CM | POA: Diagnosis not present

## 2016-12-04 DIAGNOSIS — E7800 Pure hypercholesterolemia, unspecified: Secondary | ICD-10-CM | POA: Diagnosis not present

## 2016-12-04 DIAGNOSIS — M5412 Radiculopathy, cervical region: Secondary | ICD-10-CM | POA: Diagnosis not present

## 2016-12-04 DIAGNOSIS — M5137 Other intervertebral disc degeneration, lumbosacral region: Secondary | ICD-10-CM | POA: Diagnosis not present

## 2016-12-04 DIAGNOSIS — R600 Localized edema: Secondary | ICD-10-CM

## 2016-12-04 LAB — COMPLETE METABOLIC PANEL WITH GFR
ALBUMIN: 4.4 g/dL (ref 3.6–5.1)
ALK PHOS: 101 U/L (ref 33–130)
ALT: 16 U/L (ref 6–29)
AST: 13 U/L (ref 10–35)
BUN: 15 mg/dL (ref 7–25)
CO2: 28 mmol/L (ref 20–31)
CREATININE: 0.84 mg/dL (ref 0.50–1.05)
Calcium: 9.7 mg/dL (ref 8.6–10.4)
Chloride: 100 mmol/L (ref 98–110)
GFR, Est African American: 88 mL/min (ref >=60)
GFR, Est Non African American: 76 mL/min (ref >=60)
GLUCOSE: 97 mg/dL (ref 65–99)
Potassium: 4.4 mmol/L (ref 3.5–5.3)
SODIUM: 139 mmol/L (ref 135–146)
Total Bilirubin: 0.5 mg/dL (ref 0.2–1.2)
Total Protein: 7.2 g/dL (ref 6.1–8.1)

## 2016-12-04 LAB — LIPID PANEL
Cholesterol: 185 mg/dL (ref <200)
HDL: 77 mg/dL (ref >50)
LDL Cholesterol: 87 mg/dL (ref <100)
Total CHOL/HDL Ratio: 2.4 Ratio (ref <5.0)
Triglycerides: 106 mg/dL (ref <150)
VLDL: 21 mg/dL (ref <30)

## 2016-12-04 MED ORDER — FUROSEMIDE 40 MG PO TABS: ORAL_TABLET | ORAL | 3 refills | Status: DC

## 2016-12-04 MED ORDER — PHENTERMINE HCL 37.5 MG PO TABS: 37.5000 mg | ORAL_TABLET | Freq: Every day | ORAL | 0 refills | Status: DC

## 2016-12-04 MED ORDER — AMLODIPINE BESYLATE 10 MG PO TABS: 10.0000 mg | ORAL_TABLET | Freq: Every day | ORAL | 3 refills | Status: DC

## 2016-12-04 MED ORDER — GABAPENTIN 100 MG PO CAPS: ORAL_CAPSULE | ORAL | 1 refills | Status: DC

## 2016-12-04 MED ORDER — MELOXICAM 15 MG PO TABS: 15.0000 mg | ORAL_TABLET | Freq: Every day | ORAL | 3 refills | Status: DC

## 2016-12-04 MED ORDER — SIMVASTATIN 40 MG PO TABS: 40.0000 mg | ORAL_TABLET | Freq: Every day | ORAL | 3 refills | Status: DC

## 2016-12-04 MED ORDER — ACETAMINOPHEN-CODEINE #3 300-30 MG PO TABS: ORAL_TABLET | ORAL | 1 refills | Status: DC

## 2016-12-04 MED ORDER — HYDROCHLOROTHIAZIDE 25 MG PO TABS: 25.0000 mg | ORAL_TABLET | Freq: Every day | ORAL | 0 refills | Status: DC

## 2016-12-04 MED ORDER — OLMESARTAN MEDOXOMIL 40 MG PO TABS: 40.0000 mg | ORAL_TABLET | Freq: Every day | ORAL | 0 refills | Status: DC

## 2016-12-04 MED ORDER — OLMESARTAN MEDOXOMIL-HCTZ 40-25 MG PO TABS: 1.0000 | ORAL_TABLET | Freq: Every day | ORAL | 3 refills | Status: DC

## 2016-12-04 NOTE — Progress Notes (Unsigned)
Insurance would not pay for combo benicar, [facility]
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
## 2016-12-05 ENCOUNTER — Other Ambulatory Visit: Payer: Self-pay | Admitting: *Deleted

## 2016-12-05 ENCOUNTER — Encounter: Payer: Self-pay | Admitting: Physician Assistant

## 2016-12-05 LAB — HEMOGLOBIN A1C
Hgb A1c MFr Bld: 5.6 % (ref <5.7)
Mean Plasma Glucose: 114 mg/dL

## 2016-12-05 MED ORDER — LOSARTAN POTASSIUM 100 MG PO TABS: 100.0000 mg | ORAL_TABLET | Freq: Every day | ORAL | 3 refills | Status: DC

## 2016-12-05 NOTE — Progress Notes (Signed)
Call pt: A!C great out of pre-diabetes range. Kidney, liver, glucose great.  Cholesterol is great.

## 2016-12-12 ENCOUNTER — Ambulatory Visit (INDEPENDENT_AMBULATORY_CARE_PROVIDER_SITE_OTHER): Payer: 59

## 2016-12-12 DIAGNOSIS — Z1231 Encounter for screening mammogram for malignant neoplasm of breast: Secondary | ICD-10-CM

## 2016-12-12 DIAGNOSIS — Z1239 Encounter for other screening for malignant neoplasm of breast: Secondary | ICD-10-CM

## 2016-12-12 NOTE — Progress Notes (Signed)
Call pt: normal mammogram. Follow up in one year.

## 2017-02-12 ENCOUNTER — Encounter: Payer: Self-pay | Admitting: Emergency Medicine

## 2017-02-12 ENCOUNTER — Emergency Department
Admission: EM | Admit: 2017-02-12 | Discharge: 2017-02-12 | Disposition: A | Discharge status: Home / Self Care | Payer: Self-pay | Source: Home / Self Care | Attending: Family Medicine | Admitting: Family Medicine

## 2017-02-12 DIAGNOSIS — M25572 Pain in left ankle and joints of left foot: Secondary | ICD-10-CM

## 2017-02-12 NOTE — ED Provider Notes (Signed)
CSN: [id number]     Arrival date & time 02/12/17  1206 History   None    Chief Complaint  Patient presents with  . Ankle Pain   (Consider location/radiation/quality/duration/timing/severity/associated sxs/prior Treatment) HPI  Sharon Hoover is a 61 y.o. female presenting to UC with c/o at least 1 month of Left ankle pain that has been constant but waxing and waning the last month. Pain is in medial aspect of ankle. 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 Pain is aching and sore. Improves with rest, ice, elevation and Mobic.  Pain is 2/10 at this time but 8/10 at worse. Worse with dorsiflexion but better with plantarflexion.  She was wearing an OTC pull on ankle splint for comfort but had to stop wearing because it caused a blister on the back of her heel.  Pt notes her insurance does not kick in for about 1-2 weeks so she was trying to wait until then but her family encouraged her to come be evaluated.     Past Medical History:  Diagnosis Date  . Bipolar 2 disorder (HCC)    pscyh hospitaliztion 2006  . DDD (degenerative disc disease), lumbar    uses tens unit  . Hernia    abdominal incisional  . Herniated disc   . Hypercholesteremia   . Hyperlipidemia   . Hypertension   . Impaired fasting glucose   . Obesity   . Stress incontinence, female    Past Surgical History:  Procedure Laterality Date  . ABDOMINAL HYSTERECTOMY    . BLADDER REPAIR    . BLADDER REPAIR    . CHOLECYSTECTOMY    . THYROIDECTOMY    . THYROIDECTOMY    . TOTAL ABDOMINAL HYSTERECTOMY W/ BILATERAL SALPINGOOPHORECTOMY  2001   for ovarian cyst   Family History  Problem Relation Age of Onset  . Other Father        AMI  . Hyperlipidemia Father   . Hypertension Father   . Heart attack Father   . Cancer Other [id number]       breast  . Cancer Mother [id number]       breast  . Depression Sister    Social History  Substance Use Topics  . Smoking status: Former  Smoker    Quit date: 07/23/1994  . Smokeless tobacco: Never Used  . Alcohol use Yes     Comment: very rarely   OB History    No data available     Review of Systems  Musculoskeletal: Positive for arthralgias and myalgias. Negative for joint swelling.  Skin: Negative for color change, rash and wound.  Neurological: Negative for weakness and numbness.    Allergies  Erythromycin; Grapefruit diet or [extra strength grapefruit]; Miconazole nitrate; Other; and Pravastatin  Home Medications   Prior to Admission medications   Medication Sig Start Date End Date Taking? Authorizing Provider  acetaminophen-codeine (TYLENOL #3) 300-30 MG tablet take 1 tablet by mouth every 6 hours if needed for pain 12/04/16   Breeback, Jade L, PA-C  buPROPion (WELLBUTRIN XL) 300 MG 24 hr tablet Take 1 tablet (300 mg total) by mouth daily. Appointment required for future refills. 01/19/14   Tandy GawBreeback, Jade L, PA-C  econazole nitrate 1 % cream apply to affected area once daily 06/22/16   Breeback, Jade L, PA-C  furosemide (LASIX) 40 MG tablet Take 1/2 to 1 tablet daily as needed for edema. 12/04/16   Breeback,  Jade L, PA-C  gabapentin (NEURONTIN) 100 MG capsule take 1 capsule by mouth three times a day 12/04/16   Breeback, Jade L, PA-C  hydrochlorothiazide (HYDRODIURIL) 25 MG tablet Take 1 tablet (25 mg total) by mouth daily. 12/04/16   Breeback, Lonna Cobb, PA-C  lamoTRIgine (LAMICTAL) 100 MG tablet Take 2 tablets (200 mg total) by mouth at bedtime. 05/14/16   Breeback, Jade L, PA-C  losartan (COZAAR) 100 MG tablet Take 1 tablet (100 mg total) by mouth daily. 12/05/16   Breeback, Lonna Cobb, PA-C  meloxicam (MOBIC) 15 MG tablet Take 1 tablet (15 mg total) by mouth daily. 12/04/16   Jomarie Longs, PA-C  phentermine (ADIPEX-P) 37.5 MG tablet take 1 tablet by mouth once daily BEFORE BREAKFAST Patient not taking: Reported on 12/04/2016 07/11/16   Jomarie Longs, PA-C  phentermine (ADIPEX-P) 37.5 MG tablet Take 1 tablet (37.5 mg  total) by mouth daily before breakfast. 12/04/16   Breeback, Jade L, PA-C  ranitidine (ZANTAC) 150 MG capsule Take 150 mg by mouth 2 (two) times daily.    [provider]  sertraline (ZOLOFT) 100 MG tablet take 1 and 1/2 tablet by mouth once daily    Breeback, Jade L, PA-C  simvastatin (ZOCOR) 40 MG tablet Take 1 tablet (40 mg total) by mouth at bedtime. 12/04/16   Jomarie Longs, PA-C  traZODone (DESYREL) 50 MG tablet Reported on 10/21/2015 01/20/15   [provider]   Meds Ordered and Administered this Visit  Medications - No data to display  BP 140/76 (BP Location: Right Arm)   Pulse 84   Wt 255 lb (115.7 kg)   SpO2 96%   BMI 42.43 kg/m  No data found.   Physical Exam  Constitutional: She is oriented to person, place, and time. She appears well-developed and well-nourished.  HENT:  Head: Normocephalic and atraumatic.  Eyes: EOM are normal.  Neck: Normal range of motion.  Cardiovascular: Normal rate.   Pulses:      Dorsalis pedis pulses are 2+ on the left side.       Posterior tibial pulses are 2+ on the left side.  Pulmonary/Chest: Effort normal.  Musculoskeletal: Normal range of motion. She exhibits tenderness. She exhibits no edema.  Left ankle: no edema or deformity. Full ROM. Tenderness over medial malleolus.  Calf is soft, non-tender  Neurological: She is alert and oriented to person, place, and time.  Skin: Skin is warm and dry. Capillary refill takes less than 2 seconds.  Left ankle: skin in tact. No ecchymosis or erythema  Psychiatric: She has a normal mood and affect. Her behavior is normal.  Nursing note and vitals reviewed.   Urgent Care Course     Procedures (including critical care time)  Labs Review Labs Reviewed - No data to display  Imaging Review No results found.    MDM   1. Acute left ankle pain    Hx and exam not c/w gout. Most c/w mild ankle sprain.  Held off on imaging at this time time to r/o stress fracture as pt  waits for insurance to start.  Will treat conservatively with ankle stirrup splint and crutches for 1-2 weeks. Encouraged to keep elevating and taking Mobic. May alternate cool and warm compresses for comfort F/u with Sports Medicine if not improving in 1-2 weeks.    Lurene Shadow, [place] 02/12/17 1315

## 2017-02-12 NOTE — ED Triage Notes (Signed)
Pt c/o left ankle pain for the last month. Denies injury but getting worse.

## 2017-02-14 ENCOUNTER — Telehealth: Payer: Self-pay

## 2017-02-14 NOTE — Telephone Encounter (Signed)
Spoke with patient, did not like the air splint.  Similar to prior to being seen.  Will follow up when insurance starts next week.

## 2017-02-25 ENCOUNTER — Ambulatory Visit (INDEPENDENT_AMBULATORY_CARE_PROVIDER_SITE_OTHER): Payer: BLUE CROSS/BLUE SHIELD | Admitting: Osteopathic Medicine

## 2017-02-25 ENCOUNTER — Encounter: Payer: Self-pay | Admitting: Osteopathic Medicine

## 2017-02-25 VITALS — BP 134/73 | HR 84 | Resp 18 | Wt 258.0 lb

## 2017-02-25 DIAGNOSIS — I10 Essential (primary) hypertension: Secondary | ICD-10-CM | POA: Diagnosis not present

## 2017-02-25 DIAGNOSIS — R7303 Prediabetes: Secondary | ICD-10-CM

## 2017-02-25 DIAGNOSIS — E7800 Pure hypercholesterolemia, unspecified: Secondary | ICD-10-CM

## 2017-02-25 MED ORDER — PHENTERMINE HCL 37.5 MG PO TABS: 37.5000 mg | ORAL_TABLET | Freq: Every day | ORAL | 0 refills | Status: DC

## 2017-02-25 NOTE — Progress Notes (Signed)
HPI: Sharon Hoover is a 61 y.o. female  who presents to [facility] today, 02/25/17,  for chief complaint of:  Chief Complaint  Patient presents with  . Foot Swelling    Swelling - Initially made the appointment for lower extremity swelling but this resolved with use of Lasix and elevation of feet over the weekend.   At this point, she has several other issues she would like to address since she has not been here for some time due to insurance disruption.  Weight: would like to get back on phentermine. Obesity has been an issue for her for some time, limits her ability to exercise.  Left ankle pain: Getting better but she injured this several weeks ago, went to urgent care. Not concerned at this point about needing physical therapy but just would like to let me know that this happened.  Hypertension: Stable, no home blood pressures to report. No chest pain, pressure, shortness of breath.  Hyperlipidemia: Patient has some questions about her simvastatin dose, takes it went down to 20 mg, we have 40 mg on her list.  History of prediabetes: Recent A1c okay, patient has been only somewhat compliant with diet/exercise modifications  Recent labs reviewed:  12/04/2016: No concerns on electrolyte levels, glucose, liver function, protein levels.  02/11/2014: TSH normal   Past medical history, surgical history, social history and family history reviewed.  Patient Active Problem List   Diagnosis Date Noted  . Displaced fracture of proximal phalanx of right great toe with delayed healing 07/02/2016  . Closed displaced fracture of distal phalanx of right great toe 06/11/2016  . Morbid obesity due to excess calories (HCC) 10/24/2015  . Hyperlipidemia 10/24/2015  . Abnormal weight gain 10/24/2015  . Left cervical radiculopathy 04/04/2015  . Left fourth metacarpophalangeal joint pain of left hand 04/04/2015  . Thoracic back pain 02/23/2015  . Trigger  finger, acquired 12/31/2013  . Pre-diabetes 12/04/2013  . Insomnia 12/04/2013  . Fatigue 12/04/2013  . OSA (obstructive sleep apnea) 12/04/2013  . Hip pain 11/30/2010  . Obesity, Class III, BMI 40-49.9 (morbid obesity) (HCC) 11/15/2010  . Shoulder bursitis 11/15/2010  . NUMBNESS, ARM 09/27/2010  . DIZZINESS 08/18/2010  . CONTUSION, HEAD 08/18/2010  . BRONCHITIS, ALLERGIC 05/31/2010  . ALLERGIC RHINITIS 03/22/2010  . CERVICALGIA 11/04/2009  . BACK STRAIN 11/04/2009  . THYROID MASS 07/08/2009  . NECK SPASM 09/02/2008  . ADJUSTMENT DISORDER WITH DEPRESSED MOOD 08/18/2008  . ASTHMA UNSPECIFIED WITH EXACERBATION 08/18/2008  . CHEST WALL PAIN, ACUTE 06/22/2008  . IBS 06/07/2008  . ACNE VULGARIS 06/07/2008  . HYPERCHOLESTEROLEMIA 07/08/2007  . BIPOLAR DISORDER UNSPECIFIED 07/08/2007  . ESSENTIAL HYPERTENSION, BENIGN 07/08/2007  . FEMALE STRESS INCONTINENCE 07/08/2007  . DISC DISEASE, LUMBAR 07/08/2007    Current medication list and allergy/intolerance information reviewed.   Current Outpatient Prescriptions on File Prior to Visit  Medication Sig Dispense Refill  . acetaminophen-codeine (TYLENOL #3) 300-30 MG tablet take 1 tablet by mouth every 6 hours if needed for pain 60 tablet 1  . buPROPion (WELLBUTRIN XL) 300 MG 24 hr tablet Take 1 tablet (300 mg total) by mouth daily. Appointment required for future refills. 30 tablet 0  . econazole nitrate 1 % cream apply to affected area once daily 255 g 1  . furosemide (LASIX) 40 MG tablet Take 1/2 to 1 tablet daily as needed for edema. 90 tablet 3  . gabapentin (NEURONTIN) 100 MG capsule take 1 capsule by mouth three times a day 270  capsule 1  . hydrochlorothiazide (HYDRODIURIL) 25 MG tablet Take 1 tablet (25 mg total) by mouth daily. 180 tablet 0  . lamoTRIgine (LAMICTAL) 100 MG tablet Take 2 tablets (200 mg total) by mouth at bedtime. 60 tablet 5  . losartan (COZAAR) 100 MG tablet Take 1 tablet (100 mg total) by mouth daily. 90 tablet 3   . meloxicam (MOBIC) 15 MG tablet Take 1 tablet (15 mg total) by mouth daily. 90 tablet 3  . phentermine (ADIPEX-P) 37.5 MG tablet take 1 tablet by mouth once daily BEFORE BREAKFAST (Patient not taking: Reported on 12/04/2016) 30 tablet 0  . phentermine (ADIPEX-P) 37.5 MG tablet Take 1 tablet (37.5 mg total) by mouth daily before breakfast. 30 tablet 0  . ranitidine (ZANTAC) 150 MG capsule Take 150 mg by mouth 2 (two) times daily.    . sertraline (ZOLOFT) 100 MG tablet take 1 and 1/2 tablet by mouth once daily 45 tablet 0  . simvastatin (ZOCOR) 40 MG tablet Take 1 tablet (40 mg total) by mouth at bedtime. 90 tablet 3  . traZODone (DESYREL) 50 MG tablet Reported on 10/21/2015     No current facility-administered medications on file prior to visit.    Allergies  Allergen Reactions  . Erythromycin     REACTION: GI Upset, has taken Zpak with no side effects  . Grapefruit Diet Or [Extra Strength Grapefruit]   . Miconazole Nitrate     burning  . Other     peanuts  . Pravastatin Nausea Only      Review of Systems:  Constitutional: No recent illness  HEENT: No  headache, no vision change  Cardiac: No  chest pain, No  pressure, No palpitations, lower extremity edema has resolved  Respiratory:  No  shortness of breath. No  Cough  Gastrointestinal: No  abdominal pain, no change on bowel habits  Musculoskeletal: No new myalgia/arthralgia  Skin: No  Rash  Psychiatric: No  concerns with depression, No  concerns with anxiety  Exam:  BP 134/73   Pulse 84   Resp 18   Wt 258 lb (117 kg)   BMI 42.93 kg/m   Constitutional: VS see above. General Appearance: alert, well-developed, well-nourished, NAD  Eyes: Normal lids and conjunctive, non-icteric sclera  Ears, Nose, Mouth, Throat: MMM, Normal external inspection ears/nares/mouth/lips/gums.  Neck: No masses, trachea midline.   Respiratory: Normal respiratory effort. no wheeze, no rhonchi, no rales  Cardiovascular: S1/S2 normal, no  murmur, no rub/gallop auscultated. RRR.   Musculoskeletal: Gait normal. Symmetric and independent movement of all extremities  Neurological: Normal balance/coordination. No tremor.  Skin: warm, dry, intact.   Psychiatric: Normal judgment/insight. Normal mood and affect. Oriented x3.    Recent Results (from the past 2160 hour(s))  Lipid panel     Status: None   Collection Time: 12/04/16  9:49 AM  Result Value Ref Range   Cholesterol 185 <200 mg/dL   Triglycerides 161 <096 mg/dL   HDL 77 >04 mg/dL   Total CHOL/HDL Ratio 2.4 <5.0 Ratio   VLDL 21 <30 mg/dL   LDL Cholesterol 87 <540 mg/dL  COMPLETE METABOLIC PANEL WITH GFR     Status: None   Collection Time: 12/04/16  9:49 AM  Result Value Ref Range   Sodium 139 135 - 146 mmol/L   Potassium 4.4 3.5 - 5.3 mmol/L   Chloride 100 98 - 110 mmol/L   CO2 28 20 - 31 mmol/L   Glucose, Bld 97 65 - 99 mg/dL   BUN  15 7 - 25 mg/dL   Creat 0.45 4.09 - 8.11 mg/dL    Comment:   For patients > or = 60 years of age: The upper reference limit for Creatinine is approximately 13% higher for people identified as African-American.      Total Bilirubin 0.5 0.2 - 1.2 mg/dL   Alkaline Phosphatase 101 33 - 130 U/L   AST 13 10 - 35 U/L   ALT 16 6 - 29 U/L   Total Protein 7.2 6.1 - 8.1 g/dL   Albumin 4.4 3.6 - 5.1 g/dL   Calcium 9.7 8.6 - 91.4 mg/dL   GFR, Est African American 88 >=60 mL/min   GFR, Est Non African American 76 >=60 mL/min  Hemoglobin A1c     Status: None   Collection Time: 12/04/16  9:49 AM  Result Value Ref Range   Hgb A1c MFr Bld 5.6 <5.7 %    Comment:   For the purpose of screening for the presence of diabetes:   <5.7%       Consistent with the absence of diabetes 5.7-6.4 %   Consistent with increased risk for diabetes (prediabetes) >=6.5 %     Consistent with diabetes   This assay result is consistent with a decreased risk of diabetes.   Currently, no consensus exists regarding use of hemoglobin A1c for diagnosis of  diabetes in children.   According to American Diabetes Association (ADA) guidelines, hemoglobin A1c <7.0% represents optimal control in non-pregnant diabetic patients. Different metrics may apply to specific patient populations. Standards of Medical Care in Diabetes (ADA).      Mean Plasma Glucose 114 mg/dL    No results found.  No flowsheet data found.  Depression screen PHQ 2/9 12/04/2016  Decreased Interest 1  Down, Depressed, Hopeless 1  PHQ - 2 Score 2  Altered sleeping 2  Tired, decreased energy 1  Change in appetite 1  Feeling bad or failure about yourself  1  Trouble concentrating 2  Moving slowly or fidgety/restless 0  Suicidal thoughts 0  PHQ-9 Score 9      ASSESSMENT/PLAN:   Okay to refill phentermine the patient needs to follow-up with PCP for continuation of this and monitoring of her chronic medical problems. Printed information on weight management, will consider referral to medical weight management clinic in [place].  Obesity, Class III, BMI 40-49.9 (morbid obesity) (HCC) - Plan: phentermine (ADIPEX-P) 37.5 MG tablet  Pre-diabetes - Discussed diet/exercise lifestyle modifications  Essential hypertension, benign - Stable, follow w PCP particularly with addition of phentermine  Pure hypercholesterolemia - Confirm simvastatin dose as 40 mg, further management per PCP    Patient Instructions  Weight loss: important things to remember  It is hard work! You will have setbacks, but don't get discouraged. The goal is not short-term success, it is long-term health.   Looking at the numbers is important to track your progress and set goals, but how you are feeling and your overall health are the most important things! BMI and pounds and calories and miles logged aren't everything - they are tools to help [place] reach your goals.  You can do this!!!   Things to remember for exercise for weight loss:   Please note - I am not a certified personal trainer. I  can present you with ideas and general workout goals, but an exercise program is largely up to you. Find something you can stick with, and something you enjoy!   As you progress in your exercise regimen  think about gradually increasing the following, week by week:   intensity (how strenuous is your workout)  frequency (how often you are exercising)  duration (how many minutes at a time you are exercising)  Walking for 20 minutes a day is certainly better than nothing, but more strenuous exercise will develop better cardiovascular fitness.   interval training (high-intensity alternating with low-intensity, think walk/jog rather than just walk)  muscle strengthening exercises (weight lifting, calisthenics, yoga) - this also helps prevent osteoporosis!   Things to remember for diet changes for weight loss:   Please note - I am not a certified dietician. I can present you with ideas and general diet goals, but a meal plan is largely up to you. I am happy to refer you to a dietician who can give you a detailed meal plan.  Apps/logs are crucial to track how you're eating! It's not realistic to be logging everything you eat forever, but when you're starting a healthy eating lifestyle it's very helpful, and checking in with logs now and then helps you stick to your program!   Calorie restriction with the goal weight loss of no more than one to one and a half pounds per week.   Increase lean protein such as chicken, fish, [place].   Decrease fatty foods such as dairy, butter.   Decrease sugary foods. Avoid sugary drinks such as soda or juice.  Increase fiber found in fruit and vegetables.   Medications approved for long-term use for obesity  Qsymia (Phentermine and Topiramate)*  Saxenda (Liraglutide 3 mg/day) - injection*  Contrave (Bupropion and Naltrexone)  Lorcaserin (Belviq or Belviq XR)  Orlistat (Xenical, Alli)  Bupropion (Wellbutrin) I recommend that you research the above  medications and see which one(s) your insurance may or may not cover: If you call your insurance, ask them specifically what medications are on their formulary that are approved for obesity treatment. They should be able to send you a list or tell you over the phone. Remember, medications aren't magic! You MUST be diligent about lifestyle changes as well!    [phone] Quillian Quincearen Beasley MD - medical weight loss     Follow-up plan: Return in about 4 weeks (around 03/25/2017) for weight follow-up with Jade .  Visit summary with medication list and pertinent instructions was printed for patient to review, alert us if any changes needed. All questions at time of visit were answered - patient instructed to contact office with any additional concerns. ER/RTC precautions were reviewed with the patient and understanding verbalized.   Note: Total time spent 25 minutes, greater than 50% of the visit was spent face-to-face counseling and coordinating care for the following: The primary encounter diagnosis was Obesity, Class III, BMI 40-49.9 (morbid obesity) (HCC). Diagnoses of Pre-diabetes, Essential hypertension, benign, and Pure hypercholesterolemia were also pertinent to this visit..Marland Kitchen

## 2017-02-25 NOTE — Patient Instructions (Addendum)
Weight loss: important things to remember  It is hard work! You will have setbacks, but don't get discouraged. The goal is not short-term success, it is long-term health.   Looking at the numbers is important to track your progress and set goals, but how you are feeling and your overall health are the most important things! BMI and pounds and calories and miles logged aren't everything - they are tools to help us reach your goals.  You can do this!!!   Things to remember for exercise for weight loss:   Please note - I am not a certified personal trainer. I can present you with ideas and general workout goals, but an exercise program is largely up to you. Find something you can stick with, and something you enjoy!   As you progress in your exercise regimen think about gradually increasing the following, week by week:   intensity (how strenuous is your workout)  frequency (how often you are exercising)  duration (how many minutes at a time you are exercising)  Walking for 20 minutes a day is certainly better than nothing, but more strenuous exercise will develop better cardiovascular fitness.   interval training (high-intensity alternating with low-intensity, think walk/jog rather than just walk)  muscle strengthening exercises (weight lifting, calisthenics, yoga) - this also helps prevent osteoporosis!   Things to remember for diet changes for weight loss:   Please note - I am not a certified dietician. I can present you with ideas and general diet goals, but a meal plan is largely up to you. I am happy to refer you to a dietician who can give you a detailed meal plan.  Apps/logs are crucial to track how you're eating! It's not realistic to be logging everything you eat forever, but when you're starting a healthy eating lifestyle it's very helpful, and checking in with logs now and then helps you stick to your program!   Calorie restriction with the goal weight loss of no more than one  to one and a half pounds per week.   Increase lean protein such as chicken, fish, [place].   Decrease fatty foods such as dairy, butter.   Decrease sugary foods. Avoid sugary drinks such as soda or juice.  Increase fiber found in fruit and vegetables.   Medications approved for long-term use for obesity  Qsymia (Phentermine and Topiramate)*  Saxenda (Liraglutide 3 mg/day) - injection*  Contrave (Bupropion and Naltrexone)  Lorcaserin (Belviq or Belviq XR)  Orlistat (Xenical, Alli)  Bupropion (Wellbutrin) I recommend that you research the above medications and see which one(s) your insurance may or may not cover: If you call your insurance, ask them specifically what medications are on their formulary that are approved for obesity treatment. They should be able to send you a list or tell you over the phone. Remember, medications aren't magic! You MUST be diligent about lifestyle changes as well!    [phone] Quillian Quincearen Beasley MD - medical weight loss

## 2017-02-28 ENCOUNTER — Telehealth: Payer: Self-pay | Admitting: Sports Medicine

## 2017-02-28 NOTE — Telephone Encounter (Signed)
Patient called adv she saw Dr. Lyn HollingsheadAlexander this week and mentioned having ankle pain asked if she wanted an xray pt said it was not hurting as bad that day and now it has gotten worse. Pt would like to get an xray of left ankle hurts to walk and should of took Dr. Amanda PeaAlexanders offer when she asked. Please adv.

## 2017-02-28 NOTE — Telephone Encounter (Signed)
At this point, I can order an x-ray but I will not be able to follow-up on this since I will be leaving the office. Would advise that she follow up to discuss the matter further with sports medicine, I did not closely examine the ankle - she mentioned this to me but most of the visit was spent addressing other issues.

## 2017-03-01 ENCOUNTER — Encounter: Payer: Self-pay | Admitting: *Deleted

## 2017-03-01 ENCOUNTER — Emergency Department
Admission: EM | Admit: 2017-03-01 | Discharge: 2017-03-01 | Disposition: A | Discharge status: Home / Self Care | Payer: BLUE CROSS/BLUE SHIELD | Source: Home / Self Care | Attending: Family Medicine | Admitting: Family Medicine

## 2017-03-01 ENCOUNTER — Emergency Department (INDEPENDENT_AMBULATORY_CARE_PROVIDER_SITE_OTHER): Payer: BLUE CROSS/BLUE SHIELD

## 2017-03-01 DIAGNOSIS — M25572 Pain in left ankle and joints of left foot: Secondary | ICD-10-CM

## 2017-03-01 DIAGNOSIS — M7989 Other specified soft tissue disorders: Secondary | ICD-10-CM | POA: Diagnosis not present

## 2017-03-01 DIAGNOSIS — M958 Other specified acquired deformities of musculoskeletal system: Secondary | ICD-10-CM

## 2017-03-01 NOTE — Discharge Instructions (Signed)
°  It is recommended you stay off your Left foot as much as possible and follow up with Sports Medicine for further evaluation and treatment.  They may want to schedule you for an MRI and/or other specific imaging/treatment of your Left ankle pain.   Try to keep your Left foot elevated during the day to limit swelling and pain.

## 2017-03-01 NOTE — ED Provider Notes (Signed)
[facility]    CSN: [id number] Arrival date & time: 03/01/17  1319     History   Chief Complaint Chief Complaint  Patient presents with  . Ankle Pain    HPI Sharon Hoover is a 61 y.o. female.   HPI  Sharon Hoover is a 61 y.o. female presenting to UC with c/o continued Left ankle pain along medial aspect.  Pain is aching and sore, worse with ambulation, 6/10.  Pt was seen by me on 02/12/17 for same. Due to lack of insurance at that time, pt held off on imaging, pain was treated with stirrup ankle splint and crutches. Pt has since received her health insurance. She was see by Dr. Lyn HollingsheadAlexander on 02/25/17 for a routine visit and mentioned the ankle pain, but it was minimal at that time, pt declined imaging again.  Yesterday, pain started hurting worse, she is here requesting imaging. No specific known injury. 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  She also notes she gets swelling in her legs, takes lasix but has to limit it during the day due to her job. The swelling has gradually worsened throughout the week.    Past Medical History:  Diagnosis Date  . Bipolar 2 disorder (HCC)    pscyh hospitaliztion 2006  . DDD (degenerative disc disease), lumbar    uses tens unit  . Hernia    abdominal incisional  . Herniated disc   . Hypercholesteremia   . Hyperlipidemia   . Hypertension   . Impaired fasting glucose   . Obesity   . Stress incontinence, female     Patient Active Problem List   Diagnosis Date Noted  . Displaced fracture of proximal phalanx of right great toe with delayed healing 07/02/2016  . Closed displaced fracture of distal phalanx of right great toe 06/11/2016  . Morbid obesity due to excess calories (HCC) 10/24/2015  . Hyperlipidemia 10/24/2015  . Abnormal weight gain 10/24/2015  . Left cervical radiculopathy 04/04/2015  . Left fourth metacarpophalangeal joint pain of left hand 04/04/2015  . Thoracic back pain 02/23/2015  .  Trigger finger, acquired 12/31/2013  . Pre-diabetes 12/04/2013  . Insomnia 12/04/2013  . Fatigue 12/04/2013  . OSA (obstructive sleep apnea) 12/04/2013  . Hip pain 11/30/2010  . Obesity, Class III, BMI 40-49.9 (morbid obesity) (HCC) 11/15/2010  . Shoulder bursitis 11/15/2010  . NUMBNESS, ARM 09/27/2010  . DIZZINESS 08/18/2010  . CONTUSION, HEAD 08/18/2010  . BRONCHITIS, ALLERGIC 05/31/2010  . ALLERGIC RHINITIS 03/22/2010  . CERVICALGIA 11/04/2009  . BACK STRAIN 11/04/2009  . THYROID MASS 07/08/2009  . NECK SPASM 09/02/2008  . ADJUSTMENT DISORDER WITH DEPRESSED MOOD 08/18/2008  . ASTHMA UNSPECIFIED WITH EXACERBATION 08/18/2008  . CHEST WALL PAIN, ACUTE 06/22/2008  . IBS 06/07/2008  . ACNE VULGARIS 06/07/2008  . HYPERCHOLESTEROLEMIA 07/08/2007  . BIPOLAR DISORDER UNSPECIFIED 07/08/2007  . ESSENTIAL HYPERTENSION, BENIGN 07/08/2007  . FEMALE STRESS INCONTINENCE 07/08/2007  . DISC DISEASE, LUMBAR 07/08/2007    Past Surgical History:  Procedure Laterality Date  . ABDOMINAL HYSTERECTOMY    . BLADDER REPAIR    . BLADDER REPAIR    . CHOLECYSTECTOMY    . THYROIDECTOMY    . THYROIDECTOMY    . TOTAL ABDOMINAL HYSTERECTOMY W/ BILATERAL SALPINGOOPHORECTOMY  2001   for ovarian cyst    OB History    No data available       Home Medications    Prior to Admission medications  Medication Sig Start Date End Date Taking? Authorizing Provider  acetaminophen-codeine (TYLENOL #3) 300-30 MG tablet take 1 tablet by mouth every 6 hours if needed for pain 12/04/16   Breeback, Jade L, PA-C  buPROPion (WELLBUTRIN XL) 300 MG 24 hr tablet Take 1 tablet (300 mg total) by mouth daily. Appointment required for future refills. 01/19/14   Tandy Gaw L, PA-C  econazole nitrate 1 % cream apply to affected area once daily 06/22/16   Breeback, Jade L, PA-C  furosemide (LASIX) 40 MG tablet Take 1/2 to 1 tablet daily as needed for edema. 12/04/16   Tandy Gaw L, PA-C  gabapentin (NEURONTIN) 100 MG  capsule take 1 capsule by mouth three times a day 12/04/16   Breeback, Jade L, PA-C  hydrochlorothiazide (HYDRODIURIL) 25 MG tablet Take 1 tablet (25 mg total) by mouth daily. 12/04/16   Breeback, Lonna Cobb, PA-C  lamoTRIgine (LAMICTAL) 100 MG tablet Take 2 tablets (200 mg total) by mouth at bedtime. 05/14/16   Breeback, Jade L, PA-C  losartan (COZAAR) 100 MG tablet Take 1 tablet (100 mg total) by mouth daily. 12/05/16   Breeback, Lonna Cobb, PA-C  meloxicam (MOBIC) 15 MG tablet Take 1 tablet (15 mg total) by mouth daily. 12/04/16   Jomarie Longs, PA-C  phentermine (ADIPEX-P) 37.5 MG tablet Take 1 tablet (37.5 mg total) by mouth daily before breakfast. 02/25/17   Sunnie Nielsen, DO  ranitidine (ZANTAC) 150 MG capsule Take 150 mg by mouth 2 (two) times daily.    [provider]  sertraline (ZOLOFT) 100 MG tablet take 1 and 1/2 tablet by mouth once daily    Breeback, Jade L, PA-C  simvastatin (ZOCOR) 40 MG tablet Take 1 tablet (40 mg total) by mouth at bedtime. 12/04/16   Jomarie Longs, PA-C  traZODone (DESYREL) 50 MG tablet Reported on 10/21/2015 01/20/15   [provider]    Family History Family History  Problem Relation Age of Onset  . Other Father        AMI  . Hyperlipidemia Father   . Hypertension Father   . Heart attack Father   . Cancer Other 76       breast  . Cancer Mother 48       breast  . Depression Sister     Social History Social History  Substance Use Topics  . Smoking status: Former Smoker    Quit date: 07/23/1994  . Smokeless tobacco: Never Used  . Alcohol use Yes     Comment: very rarely     Allergies   Erythromycin; Grapefruit diet or [extra strength grapefruit]; Miconazole nitrate; Other; and Pravastatin   Review of Systems Review of Systems  Musculoskeletal: Positive for arthralgias and joint swelling.  Skin: Negative for color change and wound.  Neurological: Negative for weakness and numbness.     Physical Exam Triage Vital Signs ED  Triage Vitals [03/01/17 1401]  Enc Vitals Group     BP (!) 142/82     Pulse Rate 73     Resp 18     Temp      Temp src      SpO2 98 %     Weight      Height      Head Circumference      Peak Flow      Pain Score 6     Pain Loc      Pain Edu?      Excl. in GC?  No data found.   Updated Vital Signs BP (!) 142/82 (BP Location: Left Arm)   Pulse 73   Resp 18   SpO2 98%      Physical Exam  Constitutional: She is oriented to person, place, and time. She appears well-developed and well-nourished.  HENT:  Head: Normocephalic and atraumatic.  Eyes: EOM are normal.  Neck: Normal range of motion.  Cardiovascular: Normal rate.   Pulmonary/Chest: Effort normal.  Musculoskeletal: Normal range of motion. She exhibits edema and tenderness.  Bilateral pedal edema.  Left ankle: no obvious deformity, full ROM. Tenderness to medial aspect.   Neurological: She is alert and oriented to person, place, and time.  Skin: Skin is warm and dry. Capillary refill takes less than 2 seconds. No erythema.  Left ankle: skin in tact. No ecchymosis or erythema.   Psychiatric: She has a normal mood and affect. Her behavior is normal.  Nursing note and vitals reviewed.    UC Treatments / Results  Labs (all labs ordered are listed, but only abnormal results are displayed) Labs Reviewed - No data to display  EKG  EKG Interpretation None       Radiology Dg Ankle Complete Left  Result Date: 03/01/2017 CLINICAL DATA:  Twisted left ankle with pain.  Initial encounter. EXAM: LEFT ANKLE COMPLETE - 3+ VIEW COMPARISON:  None. FINDINGS: Medial talar dome subchondral lucency. No malleolus fracture or dislocation. No visible loose body. Nonspecific soft tissue swelling. Small plantar heel spur. IMPRESSION: 1. Age-indeterminate osteochondral lesion of the medial talar dome. 2. Soft tissue swelling. Electronically Signed   By: Marnee Spring M.D.   On: 03/01/2017 14:26   Dg Foot Complete Left  Result  Date: 03/01/2017 CLINICAL DATA:  Twisted left foot and ankle with pain. Initial encounter. EXAM: LEFT FOOT - COMPLETE 3+ VIEW COMPARISON:  None. FINDINGS: Nonspecific soft tissue swelling. No acute fracture or dislocation. No opaque foreign body. Small heel spur. IMPRESSION: Soft tissue swelling without fracture. Electronically Signed   By: Marnee Spring M.D.   On: 03/01/2017 14:27    Procedures Procedures (including critical care time)  Medications Ordered in UC Medications - No data to display   Initial Impression / Assessment and Plan / UC Course  I have reviewed the triage vital signs and the nursing notes.  Pertinent labs & imaging results that were available during my care of the patient were reviewed by me and considered in my medical decision making (see chart for details).       Final Clinical Impressions(s) / UC Diagnoses   Final diagnoses:  Acute left ankle pain  Osteochondral defect of ankle   Pt placed in walking boot for comfort. Encouraged non-weight bearing however pt states crutches are difficult to use due to her size.  Encouraged to keep feet elevated throughout the day and limit walking on Left foot.   F/u with Sports Medicine for further evaluation and treatment.   New Prescriptions New Prescriptions   No medications on file     Controlled Substance Prescriptions [place] Controlled Substance Registry consulted? Not Applicable   Rolla Plate 03/01/17 1505

## 2017-03-01 NOTE — ED Triage Notes (Signed)
Alacia reports that her LT ankle pain improved after her visit on 02/12/2017, but is worse now and request an xray. Taking tylenol #3 for pain.

## 2017-03-05 ENCOUNTER — Ambulatory Visit (INDEPENDENT_AMBULATORY_CARE_PROVIDER_SITE_OTHER): Payer: BLUE CROSS/BLUE SHIELD | Admitting: Sports Medicine

## 2017-03-05 DIAGNOSIS — M958 Other specified acquired deformities of musculoskeletal system: Secondary | ICD-10-CM

## 2017-03-05 DIAGNOSIS — E66813 Obesity, class 3: Secondary | ICD-10-CM

## 2017-03-05 NOTE — Progress Notes (Signed)
   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 For the past several months she's had severe pain that she localizes along the talar dome of her left ankle. X-rays were done in urgent care that showed a medial talar dome osteochondral defect. She does get severe pain, she was placed in a boot which has only helped marginally. Symptoms are severe, persistent without radiation.  In addition she is morbidly obese, she's tried multiple modalities including Weight Watchers, counting calories, exercise, weight loss medication without significant or long lasting success.  Past medical history, Surgical history, Family history not pertinant except as noted below, Social history, Allergies, and medications have been entered into the medical record, reviewed, and no changes needed.   Review of Systems: No headache, visual changes, nausea, vomiting, diarrhea, constipation, dizziness, abdominal pain, skin rash, fevers, chills, night sweats, weight loss, swollen lymph nodes, body aches, joint swelling, muscle aches, chest pain, shortness of breath, mood changes, visual or auditory hallucinations.   Objective:   General: Well Developed, well nourished, and in no acute distress.  Neuro:  Extra-ocular muscles intact, able to move all 4 extremities, sensation grossly intact.  Deep tendon reflexes tested were normal. Psych: Alert and oriented, mood congruent with affect. ENT:  Ears and nose appear unremarkable.  Hearing grossly normal. Neck: Unremarkable overall appearance, trachea midline.  No visible thyroid enlargement. Eyes: Conjunctivae and lids appear unremarkable.  Pupils equal and round. Skin: Warm and dry, no rashes noted.  Cardiovascular: Pulses palpable, no extremity edema. Left Ankle: Mild visible swelling Range of motion is full in all directions. Strength  is 5/5 in all directions. Stable lateral and medial ligaments; squeeze test and kleiger test unremarkable; Severe tenderness at the medial talar dome No pain at base of 5th MT; No tenderness over cuboid; No tenderness over N spot or navicular prominence No tenderness on posterior aspects of lateral and medial malleolus There is a middle reproduction of pain with resisted inversion of the ankle, with tenderness over the tibialis posterior. No sign of peroneal tendon subluxations; Negative tarsal tunnel tinel's Able to walk 4 steps.  Impression and Recommendations:   This case required medical decision making of moderate complexity.  Osteochondral defect of ankle Prescription for rolling knee scooter, MRI. She has pain medicine already. After her MRI we will place a cast.  I'm also going to set her up with bariatric surgery  Obesity, Class III, BMI 40-49.9 (morbid obesity) At this point we are going to stop flirting around with medications and refer her for bariatric surgery.

## 2017-03-05 NOTE — Assessment & Plan Note (Signed)
Prescription for rolling knee scooter, MRI. She has pain medicine already. After her MRI we will place a cast.  I'm also going to set her up with bariatric surgery

## 2017-03-05 NOTE — Assessment & Plan Note (Signed)
At this point we are going to stop flirting around with medications and refer her for bariatric surgery.

## 2017-03-07 ENCOUNTER — Ambulatory Visit: Payer: Self-pay | Admitting: Sports Medicine

## 2017-03-12 ENCOUNTER — Ambulatory Visit (HOSPITAL_COMMUNITY): Payer: BLUE CROSS/BLUE SHIELD

## 2017-03-12 ENCOUNTER — Ambulatory Visit (INDEPENDENT_AMBULATORY_CARE_PROVIDER_SITE_OTHER): Payer: BLUE CROSS/BLUE SHIELD | Admitting: Sports Medicine

## 2017-03-12 ENCOUNTER — Encounter: Payer: Self-pay | Admitting: Sports Medicine

## 2017-03-12 DIAGNOSIS — M958 Other specified acquired deformities of musculoskeletal system: Secondary | ICD-10-CM | POA: Diagnosis not present

## 2017-03-12 NOTE — Assessment & Plan Note (Signed)
I was get a place a cast today, she does have some skin breakdown, we will continue to boot. Skin breakdown was dressed. She will do her MRI on Monday [facility]. Continue boot with compressive dressing until then. The ankle was strapped with compressive dressing. We will probably do a month of immobilization if it's a simple osteochondral defect, if there is a intra-articular loose body she will need arthroscopy.

## 2017-03-12 NOTE — Progress Notes (Signed)
  Subjective:    CC: follow-up  HPI: Sharon Hoover returns, she had an injury, was seen in urgent care and x-rays had shown a medial talar dome osteochondral lesion.She was placed in a boot appropriately, we have ordered an MRI. Unfortunately she was supposed to get a cast today but has developed some skin breakdown on her ankle. Pain is moderate, persistent, localized without radiation.  Past medical history:  Negative.  See flowsheet/record as well for more information.  Surgical history: Negative.  See flowsheet/record as well for more information.  Family history: Negative.  See flowsheet/record as well for more information.  Social history: Negative.  See flowsheet/record as well for more information.  Allergies, and medications have been entered into the medical record, reviewed, and no changes needed.   Review of Systems: No fevers, chills, night sweats, weight loss, chest pain, or shortness of breath.   Objective:    General: Well Developed, well nourished, and in no acute distress.  Neuro: Alert and oriented x3, extra-ocular muscles intact, sensation grossly intact.  HEENT: Normocephalic, atraumatic, pupils equal round reactive to light, neck supple, no masses, no lymphadenopathy, thyroid nonpalpable.  Skin: Warm and dry, no rashes. Cardiac: Regular rate and rhythm, no murmurs rubs or gallops, no lower extremity edema.  Respiratory: Clear to auscultation bilaterally. Not using accessory muscles, speaking in full sentences. Left Ankle: No visible erythema or swelling. There are couple of areas ofBlistering and abrasion. No signs of bacterial superinfection. Range of motion is full in all directions. Strength is 5/5 in all directions. Stable lateral and medial ligaments; squeeze test and kleiger test unremarkable; Tender to palpation at the medial talar dome No pain at base of 5th MT; No tenderness over cuboid; No tenderness over N spot or navicular prominence No tenderness on  posterior aspects of lateral and medial malleolus No sign of peroneal tendon subluxations; Negative tarsal tunnel tinel's Able to walk 4 steps.  Ankle was strapped with compressive dressing.  Impression and Recommendations:    Osteochondral defect of ankle I was get a place a cast today, she does have some skin breakdown, we will continue to boot. Skin breakdown was dressed. She will do her MRI on Monday here [facility]. Continue boot with compressive dressing until then. The ankle was strapped with compressive dressing. We will probably do a month of immobilization if it's a simple osteochondral defect, if there is a intra-articular loose body she will need arthroscopy.

## 2017-03-14 ENCOUNTER — Ambulatory Visit: Payer: Self-pay | Admitting: Sports Medicine

## 2017-03-14 ENCOUNTER — Other Ambulatory Visit (HOSPITAL_COMMUNITY): Payer: Self-pay | Admitting: General Surgery

## 2017-03-15 ENCOUNTER — Ambulatory Visit (HOSPITAL_COMMUNITY): Payer: BLUE CROSS/BLUE SHIELD

## 2017-03-18 ENCOUNTER — Ambulatory Visit (INDEPENDENT_AMBULATORY_CARE_PROVIDER_SITE_OTHER): Payer: BLUE CROSS/BLUE SHIELD

## 2017-03-18 DIAGNOSIS — X58XXXA Exposure to other specified factors, initial encounter: Secondary | ICD-10-CM | POA: Diagnosis not present

## 2017-03-18 DIAGNOSIS — S8255XA Nondisplaced fracture of medial malleolus of left tibia, initial encounter for closed fracture: Secondary | ICD-10-CM

## 2017-03-18 DIAGNOSIS — S92192A Other fracture of left talus, initial encounter for closed fracture: Secondary | ICD-10-CM | POA: Diagnosis not present

## 2017-03-18 DIAGNOSIS — M958 Other specified acquired deformities of musculoskeletal system: Secondary | ICD-10-CM

## 2017-03-19 ENCOUNTER — Ambulatory Visit (INDEPENDENT_AMBULATORY_CARE_PROVIDER_SITE_OTHER): Payer: BLUE CROSS/BLUE SHIELD | Admitting: Sports Medicine

## 2017-03-19 ENCOUNTER — Encounter: Payer: Self-pay | Admitting: Sports Medicine

## 2017-03-19 DIAGNOSIS — M958 Other specified acquired deformities of musculoskeletal system: Secondary | ICD-10-CM

## 2017-03-19 NOTE — Assessment & Plan Note (Signed)
Short-leg cast, Walker as above. Return in 2 weeks to recheck. I do expect 6 weeks of immobilization in the cast.

## 2017-03-19 NOTE — Progress Notes (Signed)
  Subjective:    CC: Follow-up  HPI: This is a pleasant 61 year old female, we recently obtained an MRI for her ankle injury, the results of which will be dictated below, still having pain in the boot.  Past medical history:  Negative.  See flowsheet/record as well for more information.  Surgical history: Negative.  See flowsheet/record as well for more information.  Family history: Negative.  See flowsheet/record as well for more information.  Social history: Negative.  See flowsheet/record as well for more information.  Allergies, and medications have been entered into the medical record, reviewed, and no changes needed.   Review of Systems: No fevers, chills, night sweats, weight loss, chest pain, or shortness of breath.   Objective:    General: Well Developed, well nourished, and in no acute distress.  Neuro: Alert and oriented x3, extra-ocular muscles intact, sensation grossly intact.  HEENT: Normocephalic, atraumatic, pupils equal round reactive to light, neck supple, no masses, no lymphadenopathy, thyroid nonpalpable.  Skin: Warm and dry, no rashes. Cardiac: Regular rate and rhythm, no murmurs rubs or gallops, no lower extremity edema.  Respiratory: Clear to auscultation bilaterally. Not using accessory muscles, speaking in full sentences. Left ankle: Skin breakdown has healed. Short-leg walking cast placed.  MRI shows a talar dome OCD as well as intra-articular medial malleolus fracture. Nondisplaced.  Impression and Recommendations:    Left talar osteochondral defect with tibial fracture Short-leg cast, Walker as above. Return in 2 weeks to recheck. I do expect 6 weeks of immobilization in the cast.

## 2017-03-22 ENCOUNTER — Telehealth: Payer: Self-pay | Admitting: Sports Medicine

## 2017-03-22 NOTE — Telephone Encounter (Signed)
Pt left VM that her cast is "loose." Spoke with Provider and was advised to have Pt come in early next week for cast change.  Returned Pt call, appointment made.

## 2017-03-26 ENCOUNTER — Ambulatory Visit (INDEPENDENT_AMBULATORY_CARE_PROVIDER_SITE_OTHER): Payer: BLUE CROSS/BLUE SHIELD | Admitting: Sports Medicine

## 2017-03-26 ENCOUNTER — Encounter: Payer: Self-pay | Admitting: Sports Medicine

## 2017-03-26 DIAGNOSIS — M958 Other specified acquired deformities of musculoskeletal system: Secondary | ICD-10-CM

## 2017-03-26 DIAGNOSIS — M546 Pain in thoracic spine: Secondary | ICD-10-CM

## 2017-03-26 DIAGNOSIS — M5137 Other intervertebral disc degeneration, lumbosacral region: Secondary | ICD-10-CM

## 2017-03-26 DIAGNOSIS — G8929 Other chronic pain: Secondary | ICD-10-CM

## 2017-03-26 DIAGNOSIS — E7800 Pure hypercholesterolemia, unspecified: Secondary | ICD-10-CM

## 2017-03-26 MED ORDER — SIMVASTATIN 40 MG PO TABS: 40.0000 mg | ORAL_TABLET | Freq: Every day | ORAL | 3 refills | Status: DC

## 2017-03-26 MED ORDER — HYDROCHLOROTHIAZIDE 25 MG PO TABS: 25.0000 mg | ORAL_TABLET | Freq: Every day | ORAL | 0 refills | Status: DC

## 2017-03-26 MED ORDER — MELOXICAM 15 MG PO TABS: 15.0000 mg | ORAL_TABLET | Freq: Every day | ORAL | 3 refills | Status: DC

## 2017-03-26 MED ORDER — LOSARTAN POTASSIUM 100 MG PO TABS: 100.0000 mg | ORAL_TABLET | Freq: Every day | ORAL | 3 refills | Status: DC

## 2017-03-26 MED ORDER — AMLODIPINE BESYLATE 10 MG PO TABS: 10.0000 mg | ORAL_TABLET | Freq: Every day | ORAL | 1 refills | Status: DC

## 2017-03-26 NOTE — Assessment & Plan Note (Signed)
Walking cast replaced after one week. Return in 4 weeks.

## 2017-03-26 NOTE — Progress Notes (Signed)
  Subjective:    CC:  Cast change  HPI: Efraim KaufmannMelissa returns, she needs a new cast.  Past medical history:  Negative.  See flowsheet/record as well for more information.  Surgical history: Negative.  See flowsheet/record as well for more information.  Family history: Negative.  See flowsheet/record as well for more information.  Social history: Negative.  See flowsheet/record as well for more information.  Allergies, and medications have been entered into the medical record, reviewed, and no changes needed.   Review of Systems: No fevers, chills, night sweats, weight loss, chest pain, or shortness of breath.   Objective:    General: Well Developed, well nourished, and in no acute distress.  Neuro: Alert and oriented x3, extra-ocular muscles intact, sensation grossly intact.  HEENT: Normocephalic, atraumatic, pupils equal round reactive to light, neck supple, no masses, no lymphadenopathy, thyroid nonpalpable.  Skin: Warm and dry, no rashes. Cardiac: Regular rate and rhythm, no murmurs rubs or gallops, no lower extremity edema.  Respiratory: Clear to auscultation bilaterally. Not using accessory muscles, speaking in full sentences.  Short-leg cast placed on the left after removal of the old cast which had become somewhat loose. This was a walking cast.  Impression and Recommendations:    Left talar osteochondral defect with tibial fracture Walking cast replaced after one week. Return in 4 weeks.  I spent 40 minutes with this patient, greater than 50% was face-to-face time counseling regarding the above diagnoses, this was in addition to the time spent placing a cast ___________________________________________ Ihor Austinhomas J. Benjamin Stainhekkekandam, M.D., ABFM., CAQSM. Primary Care and Sports Medicine [facility]  Adjunct Instructor of Family Medicine  [employer]

## 2017-03-27 ENCOUNTER — Ambulatory Visit (INDEPENDENT_AMBULATORY_CARE_PROVIDER_SITE_OTHER): Payer: BLUE CROSS/BLUE SHIELD | Admitting: Sports Medicine

## 2017-03-27 ENCOUNTER — Encounter: Payer: Self-pay | Admitting: Sports Medicine

## 2017-03-27 ENCOUNTER — Ambulatory Visit: Payer: Self-pay | Admitting: Family Medicine

## 2017-03-27 DIAGNOSIS — M958 Other specified acquired deformities of musculoskeletal system: Secondary | ICD-10-CM | POA: Diagnosis not present

## 2017-03-27 NOTE — Progress Notes (Signed)
  Subjective:    CC: Issues with cast  HPI: This is a pleasant 61 year old female, we are treating her for talar OCD, as well as tibial fracture, her previous cast placed yesterday is uncomfortable.  Past medical history:  Negative.  See flowsheet/record as well for more information.  Surgical history: Negative.  See flowsheet/record as well for more information.  Family history: Negative.  See flowsheet/record as well for more information.  Social history: Negative.  See flowsheet/record as well for more information.  Allergies, and medications have been entered into the medical record, reviewed, and no changes needed.   Review of Systems: No fevers, chills, night sweats, weight loss, chest pain, or shortness of breath.   Objective:    General: Well Developed, well nourished, and in no acute distress.  Neuro: Alert and oriented x3, extra-ocular muscles intact, sensation grossly intact.  HEENT: Normocephalic, atraumatic, pupils equal round reactive to light, neck supple, no masses, no lymphadenopathy, thyroid nonpalpable.  Skin: Warm and dry, no rashes. Cardiac: Regular rate and rhythm, no murmurs rubs or gallops, no lower extremity edema.  Respiratory: Clear to auscultation bilaterally. Not using accessory muscles, speaking in full sentences.  Cast is removed, and new short-leg walking cast placed on the left.  Impression and Recommendations:    Left talar osteochondral defect with tibial fracture Patient needed a new walking cast which was replaced today. Return in 4 weeks.  ___________________________________________ Ihor Austinhomas J. Benjamin Stainhekkekandam, M.D., ABFM., CAQSM. Primary Care and Sports Medicine  [facility]  Adjunct Instructor of Family Medicine  [employer]

## 2017-03-27 NOTE — Assessment & Plan Note (Signed)
Patient needed a new walking cast which was replaced today. Return in 4 weeks.

## 2017-04-01 ENCOUNTER — Ambulatory Visit (INDEPENDENT_AMBULATORY_CARE_PROVIDER_SITE_OTHER): Payer: BLUE CROSS/BLUE SHIELD | Admitting: Physician Assistant

## 2017-04-01 ENCOUNTER — Encounter: Payer: Self-pay | Admitting: Physician Assistant

## 2017-04-01 VITALS — BP 140/80 | HR 95 | Wt 258.0 lb

## 2017-04-01 DIAGNOSIS — I10 Essential (primary) hypertension: Secondary | ICD-10-CM

## 2017-04-01 MED ORDER — PHENTERMINE HCL 37.5 MG PO TABS: 37.5000 mg | ORAL_TABLET | Freq: Every day | ORAL | 0 refills | Status: DC

## 2017-04-01 NOTE — Progress Notes (Signed)
Subjective:    Patient ID: Sharon Hoover, female    DOB: [birth year], 61 y.o.   MRN: [id number]  HPI   Pt is a 61 yo morbidly obese female who presents to the clinic for weight follow up. 8/6 pt was 258 and now with cast up to left knee weighs the same. She is on phentermine. She is seeing bariartric surgery and preparing for gastric sleeve.   HTN- doing well. No CP, palpitations, headaches, SOB, vision changes. On Norvasc and HCTZ daily.   . Active Ambulatory Problems    Diagnosis Date Noted  . THYROID MASS 07/08/2009  . HYPERCHOLESTEROLEMIA 07/08/2007  . BIPOLAR DISORDER UNSPECIFIED 07/08/2007  . ADJUSTMENT DISORDER WITH DEPRESSED MOOD 08/18/2008  . ESSENTIAL HYPERTENSION, BENIGN 07/08/2007  . ALLERGIC RHINITIS 03/22/2010  . BRONCHITIS, ALLERGIC 05/31/2010  . ASTHMA UNSPECIFIED WITH EXACERBATION 08/18/2008  . IBS 06/07/2008  . FEMALE STRESS INCONTINENCE 07/08/2007  . ACNE VULGARIS 06/07/2008  . DISC DISEASE, LUMBAR 07/08/2007  . CERVICALGIA 11/04/2009  . CHEST WALL PAIN, ACUTE 06/22/2008  . NECK SPASM 09/02/2008  . BACK STRAIN 11/04/2009  . DIZZINESS 08/18/2010  . CONTUSION, HEAD 08/18/2010  . NUMBNESS, ARM 09/27/2010  . Obesity, Class III, BMI 40-49.9 (morbid obesity) (HCC) 11/15/2010  . Shoulder bursitis 11/15/2010  . Hip pain 11/30/2010  . Pre-diabetes 12/04/2013  . Insomnia 12/04/2013  . Fatigue 12/04/2013  . OSA (obstructive sleep apnea) 12/04/2013  . Trigger finger, acquired 12/31/2013  . Thoracic back pain 02/23/2015  . Left cervical radiculopathy 04/04/2015  . Left fourth metacarpophalangeal joint pain of left hand 04/04/2015  . Morbid obesity due to excess calories (HCC) 10/24/2015  . Hyperlipidemia 10/24/2015  . Abnormal weight gain 10/24/2015  . Closed displaced fracture of distal phalanx of right great toe 06/11/2016  . Displaced fracture of proximal phalanx of right great toe with delayed healing 07/02/2016  . Left talar osteochondral defect with  tibial fracture 03/05/2017   Resolved Ambulatory Problems    Diagnosis Date Noted  . Acute bronchitis 01/02/2008  . CELLULITIS 11/09/2009  . TOE PAIN 12/22/2008  . Wheezing 09/07/2008  . FRACTURE, FINGER, DISTAL 07/08/2009  . FRACTURE, TOE 12/23/2008  . MUSCLE STRAIN, HAMSTRING MUSCLE 03/22/2010   Past Medical History:  Diagnosis Date  . Bipolar 2 disorder (HCC)   . DDD (degenerative disc disease), lumbar   . Hernia   . Herniated disc   . Hypercholesteremia   . Hyperlipidemia   . Hypertension   . Impaired fasting glucose   . Obesity   . Stress incontinence, female      Review of Systems See HPI.     Objective:   Physical Exam  Constitutional: She is oriented to person, place, and time. She appears well-developed and well-nourished.  Obesity.   Cardiovascular: Normal rate, regular rhythm and normal heart sounds.   Neurological: She is alert and oriented to person, place, and time.  Psychiatric: She has a normal mood and affect. Her behavior is normal.          Assessment & Plan:  Marland KitchenMarland KitchenMelissa was seen today for bp and wt check.  Diagnoses and all orders for this visit:  Essential hypertension, benign  Obesity, Class III, BMI 40-49.9 (morbid obesity) (HCC) -     phentermine (ADIPEX-P) 37.5 MG tablet; Take 1 tablet (37.5 mg total) by mouth daily before breakfast.  Morbid obesity (HCC)  Morbid obesity due to excess calories (HCC)   Perhaps lost a few lbs but cast is keeping her weight stable.  Refilled phentermine. Continue to follow with bariatric.  Marland Kitchen.Discussed low carb diet with 1500 calories and 80g of protein.  Exercising at least 150 minutes a week.  My Fitness Pal could be a [employer] resource.   HTN a little elevated. Discussed  To continue to monitor at home.

## 2017-04-02 ENCOUNTER — Ambulatory Visit: Payer: Self-pay | Admitting: Sports Medicine

## 2017-04-05 ENCOUNTER — Ambulatory Visit (HOSPITAL_COMMUNITY): Payer: BLUE CROSS/BLUE SHIELD

## 2017-04-05 ENCOUNTER — Inpatient Hospital Stay (HOSPITAL_COMMUNITY): Admission: RE | Admit: 2017-04-05 | Payer: Self-pay | Source: Ambulatory Visit

## 2017-04-10 ENCOUNTER — Encounter: Payer: BLUE CROSS/BLUE SHIELD | Attending: General Surgery | Admitting: Skilled Nursing Facility1

## 2017-04-10 DIAGNOSIS — Z713 Dietary counseling and surveillance: Secondary | ICD-10-CM | POA: Insufficient documentation

## 2017-04-10 DIAGNOSIS — G4733 Obstructive sleep apnea (adult) (pediatric): Secondary | ICD-10-CM | POA: Diagnosis not present

## 2017-04-10 DIAGNOSIS — Z6841 Body Mass Index (BMI) 40.0 and over, adult: Secondary | ICD-10-CM | POA: Diagnosis not present

## 2017-04-10 DIAGNOSIS — E785 Hyperlipidemia, unspecified: Secondary | ICD-10-CM | POA: Diagnosis not present

## 2017-04-10 DIAGNOSIS — F319 Bipolar disorder, unspecified: Secondary | ICD-10-CM | POA: Diagnosis not present

## 2017-04-10 DIAGNOSIS — Z833 Family history of diabetes mellitus: Secondary | ICD-10-CM | POA: Diagnosis not present
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
## 2017-04-15 ENCOUNTER — Ambulatory Visit (HOSPITAL_COMMUNITY)
Admission: RE | Admit: 2017-04-15 | Discharge: 2017-04-15 | Disposition: A | Discharge status: Home / Self Care | Payer: BLUE CROSS/BLUE SHIELD | Source: Ambulatory Visit | Attending: General Surgery | Admitting: General Surgery

## 2017-04-15 ENCOUNTER — Other Ambulatory Visit: Payer: Self-pay

## 2017-04-15 DIAGNOSIS — Z01818 Encounter for other preprocedural examination: Secondary | ICD-10-CM | POA: Insufficient documentation

## 2017-04-16 ENCOUNTER — Ambulatory Visit (INDEPENDENT_AMBULATORY_CARE_PROVIDER_SITE_OTHER): Payer: BLUE CROSS/BLUE SHIELD | Admitting: Sports Medicine

## 2017-04-16 DIAGNOSIS — M958 Other specified acquired deformities of musculoskeletal system: Secondary | ICD-10-CM | POA: Diagnosis not present

## 2017-04-16 DIAGNOSIS — S92411G Displaced fracture of proximal phalanx of right great toe, subsequent encounter for fracture with delayed healing: Secondary | ICD-10-CM

## 2017-04-16 NOTE — Assessment & Plan Note (Signed)
Doing extremely well after one month of cast immobilization. Discontinue cast, she will do a boot for 2 weeks and then return to see me in one month, home rehabilitation exercises. 

## 2017-04-16 NOTE — Progress Notes (Signed)
  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 Overall feels well, pain-free.  Past medical history:  Negative.  See flowsheet/record as well for more information.  Surgical history: Negative.  See flowsheet/record as well for more information.  Family history: Negative.  See flowsheet/record as well for more information.  Social history: Negative.  See flowsheet/record as well for more information.  Allergies, and medications have been entered into the medical record, reviewed, and no changes needed.   Review of Systems: No fevers, chills, night sweats, weight loss, chest pain, or shortness of breath.   Objective:    General: Well Developed, well nourished, and in no acute distress.  Neuro: Alert and oriented x3, extra-ocular muscles intact, sensation grossly intact.  HEENT: Normocephalic, atraumatic, pupils equal round reactive to light, neck supple, no masses, no lymphadenopathy, thyroid nonpalpable.  Skin: Warm and dry, no rashes. Cardiac: Regular rate and rhythm, no murmurs rubs or gallops, no lower extremity edema.  Respiratory: Clear to auscultation bilaterally. Not using accessory muscles, speaking in full sentences. Left Ankle: Cast is removed No visible erythema or swelling. Range of motion is full in all directions. Strength is 5/5 in all directions. Stable lateral and medial ligaments; squeeze test and kleiger test unremarkable; Talar dome nontender; No pain at base of 5th MT; No tenderness over cuboid; No tenderness over N spot or navicular prominence No tenderness on posterior aspects of lateral and medial malleolus No sign of peroneal tendon subluxations; Negative tarsal tunnel tinel's Able to walk 4 steps.  Ankle was strapped with compressive dressing.  Impression and Recommendations:     Left talar osteochondral defect with tibial fracture Doing extremely well after one month of cast immobilization. Discontinue cast, she will do a boot for 2 weeks and then return to see me in one month, home rehabilitation exercises.  ___________________________________________ Ihor Austin. Benjamin Stain, M.D., ABFM., CAQSM. Primary Care and Sports Medicine  [facility]  Adjunct Instructor of Family Medicine  [employer]

## 2017-04-16 NOTE — Assessment & Plan Note (Deleted)
Doing extremely well after one month of cast immobilization. Discontinue cast, she will do a boot for 2 weeks and then return to see me in one month, home rehabilitation exercises.

## 2017-04-29 ENCOUNTER — Ambulatory Visit (INDEPENDENT_AMBULATORY_CARE_PROVIDER_SITE_OTHER): Payer: BLUE CROSS/BLUE SHIELD | Admitting: Physician Assistant

## 2017-04-29 VITALS — BP 148/84 | HR 81 | Wt 258.0 lb

## 2017-04-29 DIAGNOSIS — Z23 Encounter for immunization: Secondary | ICD-10-CM | POA: Diagnosis not present

## 2017-04-29 MED ORDER — PHENTERMINE HCL 37.5 MG PO TABS: 37.5000 mg | ORAL_TABLET | Freq: Every day | ORAL | 0 refills | Status: DC

## 2017-04-29 NOTE — Progress Notes (Signed)
Patient is here for blood pressure and weight check. Denies any trouble sleeping, palpitations, or any other medication problems. Patient has not lost weight. Her weight has remained the same for the last 3 office visits. A refill for Phentermine will be sent to Provider for reveiw. Patient advised if Rx was sent in, she would need to schedule a four week nurse visit and keep her upcoming appointment with her PCP. Verbalized understanding, no further questions.  She has been evaluated for gastric surgery, she has not been approved yet. They have her seeing a nutritionist monthly.   Patient also got flu vaccination today. Patient was given a flu shot questionnaire prior to administration of immunization. All questions were answered no. Patient tolerated injection of flu immunization in left deltoid well, with no immediate complications. Advised to contact our office with any questions/concerns.   Agree with above plan. Pt aware if not losing weight phentermine will be d/c at next visit. Tandy Gaw PA-c

## 2017-05-09 ENCOUNTER — Ambulatory Visit: Payer: Self-pay | Admitting: Skilled Nursing Facility1

## 2017-05-14 ENCOUNTER — Ambulatory Visit: Payer: Self-pay | Admitting: Sports Medicine

## 2017-05-16 ENCOUNTER — Encounter: Payer: BLUE CROSS/BLUE SHIELD | Attending: General Surgery | Admitting: Skilled Nursing Facility1

## 2017-05-16 ENCOUNTER — Encounter: Payer: Self-pay | Admitting: Skilled Nursing Facility1

## 2017-05-16 DIAGNOSIS — E785 Hyperlipidemia, unspecified: Secondary | ICD-10-CM | POA: Diagnosis not present

## 2017-05-16 DIAGNOSIS — F319 Bipolar disorder, unspecified: Secondary | ICD-10-CM | POA: Diagnosis not present

## 2017-05-16 DIAGNOSIS — Z6841 Body Mass Index (BMI) 40.0 and over, adult: Secondary | ICD-10-CM | POA: Insufficient documentation

## 2017-05-16 DIAGNOSIS — G4733 Obstructive sleep apnea (adult) (pediatric): Secondary | ICD-10-CM | POA: Insufficient documentation

## 2017-05-16 DIAGNOSIS — Z713 Dietary counseling and surveillance: Secondary | ICD-10-CM | POA: Insufficient documentation

## 2017-05-16 DIAGNOSIS — Z833 Family history of diabetes mellitus: Secondary | ICD-10-CM | POA: Diagnosis not present

## 2017-05-16 NOTE — Progress Notes (Signed)
  Assessment:   1st SWL Appointment.  Pt needs 4 months of SWL. Pt arrives without her cast. Pt states she tried some protein shakes liking the equate chocolate. Pt states her eldest daughter has moved in with her but will move to [place] to be with her boyfriend. Pt states she has cut back on her soda consumption. Pt states she has worked on making more meals at home using the meal idea sheet and eating more vegetables. Pt states she only has a few teeth so chewing has been very difficult. Pt states he has been taking a multivitamin. Pt reiterated how poor she was throughout the appointment.   Surgery: Sleeve Gastrectomy  Start weight at [facility]: 256.1 Wt: 256 BMI: 46.09  MEDICATIONS: See List   DIETARY INTAKE:  24-hr recall:  B ( AM): breakfast burrito from taco bell most often sometimes 2 eggs and toast Snk ( AM):  L ( PM): pepperoni and cheese sandwich Snk ( PM):  D ( PM): steak and green beans and potatoes Snk ( PM):  Beverages: soda, water, rubios tea  Usual physical activity: ADL's  Diet to Follow: 1600 calories 180 g carbohydrates 120 g protein 44 g fat   Nutritional Diagnosis:  [place]-3.3 Overweight/obesity related to past poor dietary habits and physical inactivity as evidenced by patient w/ planned Sleeve Gastrectomy surgery following dietary guidelines for continued weight loss.    Intervention:  Nutrition counseling for upcoming Bariatric Surgery. Goals: -Encouraged to engage in 150 minutes of moderate physical activity including cardiovascular and weight baring weekly  Teaching Method Utilized:  Visual Auditory Hands on  Barriers to learning/adherence to lifestyle change: financial constraints and children    Demonstrated degree of understanding via:  Teach Back   Monitoring/Evaluation:  Dietary intake, exercise,, and body weight prn.

## 2017-06-03 ENCOUNTER — Encounter: Payer: BLUE CROSS/BLUE SHIELD | Attending: General Surgery | Admitting: Registered"

## 2017-06-03 DIAGNOSIS — G4733 Obstructive sleep apnea (adult) (pediatric): Secondary | ICD-10-CM | POA: Insufficient documentation

## 2017-06-03 DIAGNOSIS — Z713 Dietary counseling and surveillance: Secondary | ICD-10-CM | POA: Diagnosis not present

## 2017-06-03 DIAGNOSIS — Z833 Family history of diabetes mellitus: Secondary | ICD-10-CM | POA: Diagnosis not present

## 2017-06-03 DIAGNOSIS — E669 Obesity, unspecified: Secondary | ICD-10-CM

## 2017-06-03 DIAGNOSIS — F319 Bipolar disorder, unspecified: Secondary | ICD-10-CM | POA: Diagnosis not present

## 2017-06-03 DIAGNOSIS — Z6841 Body Mass Index (BMI) 40.0 and over, adult: Secondary | ICD-10-CM | POA: Insufficient documentation

## 2017-06-03 DIAGNOSIS — E785 Hyperlipidemia, unspecified: Secondary | ICD-10-CM | POA: Insufficient documentation

## 2017-06-04 ENCOUNTER — Encounter: Payer: Self-pay | Admitting: Sports Medicine

## 2017-06-04 ENCOUNTER — Ambulatory Visit (INDEPENDENT_AMBULATORY_CARE_PROVIDER_SITE_OTHER): Payer: BLUE CROSS/BLUE SHIELD | Admitting: Sports Medicine

## 2017-06-04 DIAGNOSIS — M19041 Primary osteoarthritis, right hand: Secondary | ICD-10-CM | POA: Diagnosis not present

## 2017-06-04 DIAGNOSIS — M19042 Primary osteoarthritis, left hand: Secondary | ICD-10-CM | POA: Diagnosis not present

## 2017-06-04 NOTE — Assessment & Plan Note (Signed)
Good response to left fourth MCP injection approximately 7 months ago, now having pain on the right, third MCP, injected today. Return as needed.

## 2017-06-04 NOTE — Patient Instructions (Signed)
We have been doing metacarpophalangeal joint injections for osteoarthritis, when we do the injection I use a 25-gauge needle, ultrasound guidance, and a total of 1/2 mL Kenalog 40 and 1/2 mL lidocaine 1% without epinephrine.

## 2017-06-04 NOTE — Progress Notes (Signed)
  Subjective:    CC: Worsening hand pain  HPI: This is a pleasant 61 year old female, she has known hand osteoarthritis, we have done injections into her left fourth metacarpophalangeal joint.  She is having a new pain, right-sided, third metacarpal phalangeal joint, moderate, persistent without radiation, no trauma, significant gelling is present.  Past medical history:  Negative.  See flowsheet/record as well for more information.  Surgical history: Negative.  See flowsheet/record as well for more information.  Family history: Negative.  See flowsheet/record as well for more information.  Social history: Negative.  See flowsheet/record as well for more information.  Allergies, and medications have been entered into the medical record, reviewed, and no changes needed.   Review of Systems: No fevers, chills, night sweats, weight loss, chest pain, or shortness of breath.   Objective:    General: Well Developed, well nourished, and in no acute distress.  Neuro: Alert and oriented x3, extra-ocular muscles intact, sensation grossly intact.  HEENT: Normocephalic, atraumatic, pupils equal round reactive to light, neck supple, no masses, no lymphadenopathy, thyroid nonpalpable.  Skin: Warm and dry, no rashes. Cardiac: Regular rate and rhythm, no murmurs rubs or gallops, no lower extremity edema.  Respiratory: Clear to auscultation bilaterally. Not using accessory muscles, speaking in full sentences. Right hand: Swollen with tenderness and palpable synovitis at the third metacarpophalangeal joint.  Procedure: Real-time Ultrasound Guided Injection of right third MCP Device: GE Logiq E  Verbal informed consent obtained.  Time-out conducted.  Noted no overlying erythema, induration, or other signs of local infection.  Skin prepped in a sterile fashion.  Local anesthesia: Topical Ethyl chloride.  With sterile technique and under real time ultrasound guidance: 1/2 cc kenalog 40, 1/2 cc lidocaine  injected easily Completed without difficulty  Pain immediately resolved suggesting accurate placement of the medication.  Advised to call if fevers/chills, erythema, induration, drainage, or persistent bleeding.  Images permanently stored and available for review in the ultrasound unit.  Impression: Technically successful ultrasound guided injection.  Impression and Recommendations:    Primary osteoarthritis of both hands Good response to left fourth MCP injection approximately 7 months ago, now having pain on the right, third MCP, injected today. Return as needed.  ___________________________________________ Ihor Austinhomas J. Benjamin Stainhekkekandam, M.D., ABFM., CAQSM. Primary Care and Sports Medicine [place] [facility]  Adjunct Instructor of Family Medicine  [employer]

## 2017-06-04 NOTE — Progress Notes (Signed)
  Pre-Operative Nutrition Class:  Appt start time: 0488   End time:  1830.  Patient was seen on 06/03/2017 for Pre-Operative Bariatric Surgery Education [facility].   Surgery date: 07/02/2017 Surgery type: Sleeve gastrectomy Start weight at [facility]: 256.1 Weight today: 261.1   Samples given per MNT protocol. Patient educated on appropriate usage: Bariatric Advantage Multivitamin Lot # [id number] Exp: 01/2018  Bariatric Advantage Calcium Citrate (chocolate) Lot # [id number] Exp: 04/25/2018  Bariatric Advantage Calcium Citrate (tropical orange) Lot # [id number] Exp: 03/30/2018  Unjury Protein Shake Lot # [id number] Exp: 12/15/2017   The following the learning objectives were met by the patient during this course:  Identify Pre-Op Dietary Goals and will begin 2 weeks pre-operatively  Identify appropriate sources of fluids and proteins   State protein recommendations and appropriate sources pre and post-operatively  Identify Post-Operative Dietary Goals and will follow for 2 weeks post-operatively  Identify appropriate multivitamin and calcium sources  Describe the need for physical activity post-operatively and will follow MD recommendations  State when to call healthcare provider regarding medication questions or post-operative complications  Handouts given during class include:  Pre-Op Bariatric Surgery Diet Handout  Protein Shake Handout  Post-Op Bariatric Surgery Nutrition Handout  BELT Program Information Flyer  Support Group Information Flyer  [facility] Bariatric Supplements Price List  Follow-Up Plan: Patient will follow-up at [facility] 2 weeks post operatively for diet advancement per MD.

## 2017-06-10 ENCOUNTER — Ambulatory Visit: Payer: Self-pay

## 2017-06-17 ENCOUNTER — Encounter: Payer: Self-pay | Admitting: Physician Assistant

## 2017-06-17 ENCOUNTER — Ambulatory Visit (INDEPENDENT_AMBULATORY_CARE_PROVIDER_SITE_OTHER): Payer: BLUE CROSS/BLUE SHIELD | Admitting: Physician Assistant

## 2017-06-17 VITALS — BP 142/74 | Temp 98.4°F | Ht 62.52 in | Wt 258.0 lb

## 2017-06-17 DIAGNOSIS — R059 Cough, unspecified: Secondary | ICD-10-CM

## 2017-06-17 DIAGNOSIS — I10 Essential (primary) hypertension: Secondary | ICD-10-CM

## 2017-06-17 DIAGNOSIS — R05 Cough: Secondary | ICD-10-CM

## 2017-06-17 MED ORDER — HYDROCODONE-HOMATROPINE 5-1.5 MG/5ML PO SYRP: 5.0000 mL | ORAL_SOLUTION | Freq: Every evening | ORAL | 0 refills | Status: DC | PRN

## 2017-06-17 MED ORDER — HYDROCHLOROTHIAZIDE 25 MG PO TABS: 25.0000 mg | ORAL_TABLET | Freq: Every day | ORAL | 1 refills | Status: DC

## 2017-06-17 MED ORDER — AMLODIPINE BESYLATE 10 MG PO TABS: 10.0000 mg | ORAL_TABLET | Freq: Every day | ORAL | 1 refills | Status: DC

## 2017-06-17 NOTE — Progress Notes (Signed)
Subjective:    Patient ID: Sharon Hoover, female    DOB: 1956-02-24, 61 y.o.   MRN: [id number]  HPI Pt is a 61 yo female who presents to the clinic to discuss cough in leiu of gastric sleeve surgery in 2 weeks. About 6 days ago patient coughed on a waffle fry and she has been coughing since. She denies any fever, chills, body aches, sinus pressure, ear pain. Cough is productive at times. Denies any SOB or wheezing. She is a little worse at night. She lost her voice yesterday. She wants to make sure everything is alright for her surgery.   She also wants to discuss her medications and if any changes should be made since she has so much and she is not going to be able to eat very much. She also wonders about swallowing.   .. Active Ambulatory Problems    Diagnosis Date Noted  . THYROID MASS 07/08/2009  . HYPERCHOLESTEROLEMIA 07/08/2007  . BIPOLAR DISORDER UNSPECIFIED 07/08/2007  . ADJUSTMENT DISORDER WITH DEPRESSED MOOD 08/18/2008  . ESSENTIAL HYPERTENSION, BENIGN 07/08/2007  . ALLERGIC RHINITIS 03/22/2010  . BRONCHITIS, ALLERGIC 05/31/2010  . ASTHMA UNSPECIFIED WITH EXACERBATION 08/18/2008  . IBS 06/07/2008  . FEMALE STRESS INCONTINENCE 07/08/2007  . ACNE VULGARIS 06/07/2008  . DISC DISEASE, LUMBAR 07/08/2007  . CERVICALGIA 11/04/2009  . CHEST WALL PAIN, ACUTE 06/22/2008  . NECK SPASM 09/02/2008  . BACK STRAIN 11/04/2009  . DIZZINESS 08/18/2010  . CONTUSION, HEAD 08/18/2010  . NUMBNESS, ARM 09/27/2010  . Obesity, Class III, BMI 40-49.9 (morbid obesity) (HCC) 11/15/2010  . Shoulder bursitis 11/15/2010  . Hip pain 11/30/2010  . Pre-diabetes 12/04/2013  . Insomnia 12/04/2013  . Fatigue 12/04/2013  . OSA (obstructive sleep apnea) 12/04/2013  . Trigger finger, acquired 12/31/2013  . Thoracic back pain 02/23/2015  . Left cervical radiculopathy 04/04/2015  . Primary osteoarthritis of both hands 04/04/2015  . Morbid obesity due to excess calories (HCC) 10/24/2015  .  Hyperlipidemia 10/24/2015  . Abnormal weight gain 10/24/2015  . Left talar osteochondral defect with tibial fracture 03/05/2017   Resolved Ambulatory Problems    Diagnosis Date Noted  . Acute bronchitis 01/02/2008  . CELLULITIS 11/09/2009  . TOE PAIN 12/22/2008  . Wheezing 09/07/2008  . FRACTURE, FINGER, DISTAL 07/08/2009  . FRACTURE, TOE 12/23/2008  . MUSCLE STRAIN, HAMSTRING MUSCLE 03/22/2010  . Closed displaced fracture of distal phalanx of right great toe 06/11/2016  . Displaced fracture of proximal phalanx of right great toe with delayed healing 07/02/2016   Past Medical History:  Diagnosis Date  . Bipolar 2 disorder (HCC)   . DDD (degenerative disc disease), lumbar   . Hernia   . Herniated disc   . Hypercholesteremia   . Hyperlipidemia   . Hypertension   . Impaired fasting glucose   . Obesity   . Stress incontinence, female          Review of Systems  All other systems reviewed and are negative.      Objective:   Physical Exam  Constitutional: She is oriented to person, place, and time. She appears well-developed and well-nourished.  HENT:  Head: Normocephalic and atraumatic.  Right Ear: External ear normal.  Left Ear: External ear normal.  Nose: Nose normal.  Mouth/Throat: Oropharynx is clear and moist. No oropharyngeal exudate.  TM's clear bilaterally.  Oropharynx erythematous no tonsilar swelling.  Negative for any sinus tenderness.   Eyes: Conjunctivae are normal. Right eye exhibits no discharge. Left eye exhibits no discharge.  Neck: Normal range of motion. Neck supple.  Cardiovascular: Normal rate, regular rhythm and normal heart sounds.  Pulmonary/Chest: Effort normal and breath sounds normal.  Lymphadenopathy:    She has no cervical adenopathy.  Neurological: She is alert and oriented to person, place, and time.  Psychiatric: She has a normal mood and affect. Her behavior is normal.          Assessment & Plan:  Marland Kitchen.Marland Kitchen.Janaysia was seen today  for cough.  Diagnoses and all orders for this visit:  Cough -     HYDROcodone-homatropine (HYCODAN) 5-1.5 MG/5ML syrup; Take 5 mLs by mouth at bedtime as needed.  Essential hypertension, benign -     amLODipine (NORVASC) 10 MG tablet; Take 1 tablet (10 mg total) by mouth daily. -     hydrochlorothiazide (HYDRODIURIL) 25 MG tablet; Take 1 tablet (25 mg total) by mouth daily.   Discussed cough today and at this time I do not see any worrisome bacterial etiology. Lungs sound great. Certainly things can change and if they do please contact our office. Encouraged patient to consider zinc cough drops, hydration, tylenol cold and sinus if starts to feel more congested. If we get closer to surgery date we could also be more aggressive in treatment. HO given. Pt request cough syrup for bedtime. Given hycodan and discussed NOT to take with tylenol and codeine.   [place] controlled substance database reviewed with no concerns.   BP medications that were needed were refilled. We discussed surgery and her not being abl eat like she is used too. I think she should be fine with her medication as most people are. She can certainly cut into smaller sizes most pills. If she finds that she cannot after surgery then we could try to find solutions. We talked about decreasing pill burden for a few days post op. She can hold statin and decrease gabapentin to 200mg .

## 2017-06-17 NOTE — Patient Instructions (Signed)
Laryngitis Laryngitis is inflammation of your vocal cords. This causes hoarseness, coughing, loss of voice, sore throat, or a dry throat. Your vocal cords are two bands of muscles that are found in your throat. When you speak, these cords come together and vibrate. These vibrations come out through your mouth as sound. When your vocal cords are inflamed, your voice sounds different. Laryngitis can be temporary (acute) or long-term (chronic). Most cases of acute laryngitis improve with time. Chronic laryngitis is laryngitis that lasts for more than three weeks. What are the causes? Acute laryngitis may be caused by:  A viral infection.  Lots of talking, yelling, or singing. This is also called vocal strain.  Bacterial infections.  Chronic laryngitis may be caused by:  Vocal strain.  Injury to your vocal cords.  Acid reflux (gastroesophageal reflux disease or GERD).  Allergies.  Sinus infection.  Smoking.  Alcohol abuse.  Breathing in chemicals or dust.  Growths on the vocal cords.  What increases the risk? Risk factors for laryngitis include:  Smoking.  Alcohol abuse.  Having allergies.  What are the signs or symptoms? Symptoms of laryngitis may include:  Low, hoarse voice.  Loss of voice.  Dry cough.  Sore throat.  Stuffy nose.  How is this diagnosed? Laryngitis may be diagnosed by:  Physical exam.  Throat culture.  Blood test.  Laryngoscopy. This procedure allows your health care provider to look at your vocal cords with a mirror or viewing tube.  How is this treated? Treatment for laryngitis depends on what is causing it. Usually, treatment involves resting your voice and using medicines to soothe your throat. However, if your laryngitis is caused by a bacterial infection, you may need to take antibiotic medicine. If your laryngitis is caused by a growth, you may need to have a procedure to remove it. Follow these instructions at home:  Drink  enough fluid to keep your urine clear or pale yellow.  Breathe in moist air. Use a humidifier if you live in a dry climate.  Take medicines only as directed by your health care provider.  If you were prescribed an antibiotic medicine, finish it all even if you start to feel better.  Do not smoke cigarettes or electronic cigarettes. If you need help quitting, ask your health care provider.  Talk as little as possible. Also avoid whispering, which can cause vocal strain.  Write instead of talking. Do this until your voice is back to normal. Contact a health care provider if:  You have a fever.  You have increasing pain.  You have difficulty swallowing. Get help right away if:  You cough up blood.  You have trouble breathing. This information is not intended to replace advice given to you by your health care provider. Make sure you discuss any questions you have with your health care provider. Document Released: 07/09/2005 Document Revised: 12/15/2015 Document Reviewed: 12/22/2013 Elsevier Interactive Patient Education  2018 [employer]  

## 2017-06-19 ENCOUNTER — Ambulatory Visit: Payer: Self-pay | Admitting: Skilled Nursing Facility1

## 2017-06-21 ENCOUNTER — Other Ambulatory Visit: Payer: Self-pay | Admitting: *Deleted

## 2017-06-21 DIAGNOSIS — M5412 Radiculopathy, cervical region: Secondary | ICD-10-CM

## 2017-06-21 MED ORDER — GABAPENTIN 100 MG PO CAPS: ORAL_CAPSULE | ORAL | 1 refills | Status: DC

## 2017-06-25 NOTE — Progress Notes (Signed)
Preop on 12/7.  Need orders in epic.   

## 2017-06-26 NOTE — Progress Notes (Signed)
04-15-17 (Epic) EKG, CXR

## 2017-06-26 NOTE — Patient Instructions (Addendum)
Sharon RothmanMelissa P Hoover  06/26/2017   Your procedure is scheduled on: 07-02-17  [facility]  Entrance [place] to 3rd floor to  Short Stay Center at 5:30 AM.   Call this number if you have problems the morning of surgery [phone]    Remember: ONLY 1 PERSON MAY GO WITH YOU TO SHORT STAY TO GET  READY MORNING OF YOUR SURGERY.  Do not eat food or drink liquids :After Midnight.     Take these medicines the morning of surgery with A SIP OF WATER:Amlodipine (Norvasc), Bupropion (Wellbutrin XL),  Sertraline (Zoloft), and Simvastatin (Zocor)                                 You may not have any metal on your body including hair pins and              piercings  Do not wear jewelry, make-up, lotions, powders or perfumes, deodorant             Do not wear nail polish.  Do not shave  48 hours prior to surgery.               Do not bring valuables to the hospital. [facility] IS NOT             RESPONSIBLE   FOR VALUABLES.  Contacts, dentures or bridgework may not be worn into surgery.  Leave suitcase in the car. After surgery it may be brought to your room.   Please bring your masking and tubing for your CPAP machine              Please read over the following fact sheets you were given: _____________________________________________________________________             [facility] Pre[facility] Before surgery, you can play an important role.  Because skin is not sterile, your skin needs to be as free of germs as possible.  You can reduce the number of germs on your skin by washing with CHG (chlorahexidine gluconate) soap before surgery.  CHG is an antiseptic cleaner which kills germs and bonds with the skin to continue killing germs even after washing. Please DO NOT use if you have an allergy to CHG or antibacterial soaps.  If your skin becomes reddened/irritated stop using the CHG and inform your nurse when you arrive at Short Stay. Do  not shave (including legs and underarms) for at least 48 hours prior to the first CHG shower.  You may shave your face/neck. Please follow these instructions carefully:  1.  Shower with CHG Soap the night before surgery and the  morning of Surgery.  2.  If you choose to wash your hair, wash your hair first as usual with your  normal  shampoo.  3.  After you shampoo, rinse your hair and body thoroughly to remove the  shampoo.                           4.  Use CHG as you would any other liquid soap.  You can apply chg directly  to the skin and wash                       Gently  with a scrungie or clean washcloth.  5.  Apply the CHG Soap to your body ONLY FROM THE NECK DOWN.   Do not use on face/ open                           Wound or open sores. Avoid contact with eyes, ears mouth and genitals (private parts).                       Wash face,  Genitals (private parts) with your normal soap.             6.  Wash thoroughly, paying special attention to the area where your surgery  will be performed.  7.  Thoroughly rinse your body with warm water from the neck down.  8.  DO NOT shower/wash with your normal soap after using and rinsing off  the CHG Soap.                9.  Pat yourself dry with a clean towel.            10.  Wear clean pajamas.            11.  Place clean sheets on your bed the night of your first shower and do not  sleep with pets. Day of Surgery : Do not apply any lotions/deodorants the morning of surgery.  Please wear clean clothes to the hospital/surgery center.  FAILURE TO FOLLOW THESE INSTRUCTIONS MAY RESULT IN THE CANCELLATION OF YOUR SURGERY PATIENT SIGNATURE_________________________________  NURSE SIGNATURE__________________________________  ________________________________________________________________________   Sharon MireIncentive Spirometer  An incentive spirometer is a tool that can help keep your lungs clear and active. This tool measures how well you are filling your  lungs with each breath. Taking long deep breaths may help reverse or decrease the chance of developing breathing (pulmonary) problems (especially infection) following:  A long period of time when you are unable to move or be active. BEFORE THE PROCEDURE   If the spirometer includes an indicator to show your best effort, your nurse or respiratory therapist will set it to a desired goal.  If possible, sit up straight or lean slightly forward. Try not to slouch.  Hold the incentive spirometer in an upright position. INSTRUCTIONS FOR USE  1. Sit on the edge of your bed if possible, or sit up as far as you can in bed or on a chair. 2. Hold the incentive spirometer in an upright position. 3. Breathe out normally. 4. Place the mouthpiece in your mouth and seal your lips tightly around it. 5. Breathe in slowly and as deeply as possible, raising the piston or the ball toward the top of the column. 6. Hold your breath for 3-5 seconds or for as long as possible. Allow the piston or ball to fall to the bottom of the column. 7. Remove the mouthpiece from your mouth and breathe out normally. 8. Rest for a few seconds and repeat Steps 1 through 7 at least 10 times every 1-2 hours when you are awake. Take your time and take a few normal breaths between deep breaths. 9. The spirometer may include an indicator to show your best effort. Use the indicator as a goal to work toward during each repetition. 10. After each set of 10 deep breaths, practice coughing to be sure your lungs are clear. If you have an incision (the cut made at the time of surgery), support  your incision when coughing by placing a pillow or rolled up towels firmly against it. Once you are able to get out of bed, walk around indoors and cough well. You may stop using the incentive spirometer when instructed by your caregiver.  RISKS AND COMPLICATIONS  Take your time so you do not get dizzy or light-headed.  If you are in pain, you may need  to take or ask for pain medication before doing incentive spirometry. It is harder to take a deep breath if you are having pain. AFTER USE  Rest and breathe slowly and easily.  It can be helpful to keep track of a log of your progress. Your caregiver can provide you with a simple table to help with this. If you are using the spirometer at home, follow these instructions: SEEK MEDICAL CARE IF:   You are having difficultly using the spirometer.  You have trouble using the spirometer as often as instructed.  Your pain medication is not giving enough relief while using the spirometer.  You develop fever of 100.5 F (38.1 C) or higher. SEEK IMMEDIATE MEDICAL CARE IF:   You cough up bloody sputum that had not been present before.  You develop fever of 102 F (38.9 C) or greater.  You develop worsening pain at or near the incision site. MAKE SURE YOU:   Understand these instructions.  Will watch your condition.  Will get help right away if you are not doing well or get worse. Document Released: 11/19/2006 Document Revised: 10/01/2011 Document Reviewed: 01/20/2007 ExitCare Patient Information 2014 ExitCare, [place].   ________________________________________________________________________  WHAT IS A BLOOD TRANSFUSION? Blood Transfusion Information  A transfusion is the replacement of blood or some of its parts. Blood is made up of multiple cells which provide different functions.  Red blood cells carry oxygen and are used for blood loss replacement.  White blood cells fight against infection.  Platelets control bleeding.  Plasma helps clot blood.  Other blood products are available for specialized needs, such as hemophilia or other clotting disorders. BEFORE THE TRANSFUSION  Who gives blood for transfusions?   Healthy volunteers who are fully evaluated to make sure their blood is safe. This is blood bank blood. Transfusion therapy is the safest it has ever been in the  practice of medicine. Before blood is taken from a donor, a complete history is taken to make sure that person has no history of diseases nor engages in risky social behavior (examples are intravenous drug use or sexual activity with multiple partners). The donor's travel history is screened to minimize risk of transmitting infections, such as malaria. The donated blood is tested for signs of infectious diseases, such as HIV and hepatitis. The blood is then tested to be sure it is compatible with you in order to minimize the chance of a transfusion reaction. If you or a relative donates blood, this is often done in anticipation of surgery and is not appropriate for emergency situations. It takes many days to process the donated blood. RISKS AND COMPLICATIONS Although transfusion therapy is very safe and saves many lives, the main dangers of transfusion include:   Getting an infectious disease.  Developing a transfusion reaction. This is an allergic reaction to something in the blood you were given. Every precaution is taken to prevent this. The decision to have a blood transfusion has been considered carefully by your caregiver before blood is given. Blood is not given unless the benefits outweigh the risks. AFTER THE TRANSFUSION  Right after receiving a blood transfusion, you will usually feel much better and more energetic. This is especially true if your red blood cells have gotten low (anemic). The transfusion raises the level of the red blood cells which carry oxygen, and this usually causes an energy increase.  The nurse administering the transfusion will monitor you carefully for complications. HOME CARE INSTRUCTIONS  No special instructions are needed after a transfusion. You may find your energy is better. Speak with your caregiver about any limitations on activity for underlying diseases you may have. SEEK MEDICAL CARE IF:   Your condition is not improving after your transfusion.  You  develop redness or irritation at the intravenous (IV) site. SEEK IMMEDIATE MEDICAL CARE IF:  Any of the following symptoms occur over the next 12 hours:  Shaking chills.  You have a temperature by mouth above 102 F (38.9 C), not controlled by medicine.  Chest, back, or muscle pain.  People around you feel you are not acting correctly or are confused.  Shortness of breath or difficulty breathing.  Dizziness and fainting.  You get a rash or develop hives.  You have a decrease in urine output.  Your urine turns a dark color or changes to pink, red, or brown. Any of the following symptoms occur over the next 10 days:  You have a temperature by mouth above 102 F (38.9 C), not controlled by medicine.  Shortness of breath.  Weakness after normal activity.  The white part of the eye turns yellow (jaundice).  You have a decrease in the amount of urine or are urinating less often.  Your urine turns a dark color or changes to pink, red, or brown. Document Released: 07/06/2000 Document Revised: 10/01/2011 Document Reviewed: 02/23/2008 [facility] Patient Information 2014 [place], [place].  _______________________________________________________________________

## 2017-06-27 ENCOUNTER — Ambulatory Visit: Payer: Self-pay | Admitting: General Surgery

## 2017-06-28 ENCOUNTER — Other Ambulatory Visit: Payer: Self-pay

## 2017-06-28 ENCOUNTER — Encounter (HOSPITAL_COMMUNITY): Payer: Self-pay

## 2017-06-28 ENCOUNTER — Encounter (HOSPITAL_COMMUNITY)
Admission: RE | Admit: 2017-06-28 | Discharge: 2017-06-28 | Disposition: A | Discharge status: Home / Self Care | Payer: BLUE CROSS/BLUE SHIELD | Source: Ambulatory Visit | Attending: General Surgery | Admitting: General Surgery

## 2017-06-28 DIAGNOSIS — Z01812 Encounter for preprocedural laboratory examination: Secondary | ICD-10-CM | POA: Diagnosis not present

## 2017-06-28 DIAGNOSIS — Z87891 Personal history of nicotine dependence: Secondary | ICD-10-CM | POA: Insufficient documentation

## 2017-06-28 DIAGNOSIS — E669 Obesity, unspecified: Secondary | ICD-10-CM | POA: Diagnosis not present

## 2017-06-28 DIAGNOSIS — I10 Essential (primary) hypertension: Secondary | ICD-10-CM | POA: Diagnosis not present

## 2017-06-28 LAB — CBC WITH DIFFERENTIAL/PLATELET
BASOS ABS: 0 10*3/uL (ref 0.0–0.1)
BASOS PCT: 0 %
EOS ABS: 0.1 10*3/uL (ref 0.0–0.7)
Eosinophils Relative: 1 %
HEMATOCRIT: 38.5 % (ref 36.0–46.0)
HEMOGLOBIN: 13.4 g/dL (ref 12.0–15.0)
Lymphocytes Relative: 23 %
Lymphs Abs: 1.8 10*3/uL (ref 0.7–4.0)
MCH: 30.1 pg (ref 26.0–34.0)
MCHC: 34.8 g/dL (ref 30.0–36.0)
MCV: 86.5 fL (ref 78.0–100.0)
MONO ABS: 0.7 10*3/uL (ref 0.1–1.0)
Monocytes Relative: 9 %
NEUTROS ABS: 5.5 10*3/uL (ref 1.7–7.7)
NEUTROS PCT: 67 %
Platelets: 199 10*3/uL (ref 150–400)
RBC: 4.45 MIL/uL (ref 3.87–5.11)
RDW: 12.8 % (ref 11.5–15.5)
WBC: 8.1 10*3/uL (ref 4.0–10.5)

## 2017-06-28 LAB — COMPREHENSIVE METABOLIC PANEL
ALBUMIN: 4.3 g/dL (ref 3.5–5.0)
ALT: 27 U/L (ref 14–54)
ANION GAP: 8 (ref 5–15)
AST: 26 U/L (ref 15–41)
Alkaline Phosphatase: 89 U/L (ref 38–126)
BILIRUBIN TOTAL: 0.6 mg/dL (ref 0.3–1.2)
BUN: 19 mg/dL (ref 6–20)
CO2: 28 mmol/L (ref 22–32)
Calcium: 9.3 mg/dL (ref 8.9–10.3)
Chloride: 104 mmol/L (ref 101–111)
Creatinine, Ser: 0.87 mg/dL (ref 0.44–1.00)
GFR calc Af Amer: >60 mL/min (ref >60)
GFR calc non Af Amer: >60 mL/min (ref >60)
GLUCOSE: 95 mg/dL (ref 65–99)
POTASSIUM: 3.9 mmol/L (ref 3.5–5.1)
SODIUM: 140 mmol/L (ref 135–145)
TOTAL PROTEIN: 7.8 g/dL (ref 6.5–8.1)

## 2017-06-28 LAB — ABO/RH: ABO/RH(D): O POS

## 2017-07-01 NOTE — Anesthesia Preprocedure Evaluation (Addendum)
Anesthesia Evaluation  Patient identified by MRN, date of birth, ID band Patient awake    Reviewed: Allergy & Precautions, H&P , Patient's Chart, lab work & pertinent test results, reviewed documented beta blocker date and time   Airway Mallampati: II  TM Distance: >3 FB Neck ROM: full    Dental no notable dental hx.    Pulmonary former smoker,    Pulmonary exam normal breath sounds clear to auscultation       Cardiovascular hypertension,  Rhythm:regular Rate:Normal     Neuro/Psych    GI/Hepatic   Endo/Other    Renal/GU      Musculoskeletal   Abdominal   Peds  Hematology   Anesthesia Other Findings   Reproductive/Obstetrics                             Anesthesia Physical Anesthesia Plan  ASA: III  Anesthesia Plan: General   Post-op Pain Management:    Induction: Intravenous  PONV Risk Score and Plan: 2 and Dexamethasone, Ondansetron and Treatment may vary due to age or medical condition  Airway Management Planned: Oral ETT and Video Laryngoscope Planned  Additional Equipment:   Intra-op Plan:   Post-operative Plan: Extubation in OR  Informed Consent: I have reviewed the patients History and Physical, chart, labs and discussed the procedure including the risks, benefits and alternatives for the proposed anesthesia with the patient or authorized representative who has indicated his/her understanding and acceptance.   Dental Advisory Given  Plan Discussed with: CRNA and Surgeon  Anesthesia Plan Comments: (  )       Anesthesia Quick Evaluation

## 2017-07-02 ENCOUNTER — Other Ambulatory Visit: Payer: Self-pay

## 2017-07-02 ENCOUNTER — Encounter (HOSPITAL_COMMUNITY)
Admission: RE | Disposition: A | Discharge status: Home / Self Care | Payer: Self-pay | Source: Ambulatory Visit | Attending: General Surgery

## 2017-07-02 ENCOUNTER — Encounter (HOSPITAL_COMMUNITY): Payer: Self-pay | Admitting: Anesthesiology

## 2017-07-02 ENCOUNTER — Inpatient Hospital Stay (HOSPITAL_COMMUNITY): Payer: BLUE CROSS/BLUE SHIELD | Admitting: Anesthesiology

## 2017-07-02 ENCOUNTER — Encounter: Payer: Self-pay | Admitting: Physician Assistant

## 2017-07-02 ENCOUNTER — Inpatient Hospital Stay (HOSPITAL_COMMUNITY)
Admission: RE | Admit: 2017-07-02 | Discharge: 2017-07-03 | DRG: 620 | Disposition: A | Discharge status: Home / Self Care | Payer: BLUE CROSS/BLUE SHIELD | Source: Ambulatory Visit | Attending: General Surgery | Admitting: General Surgery

## 2017-07-02 DIAGNOSIS — Z79899 Other long term (current) drug therapy: Secondary | ICD-10-CM

## 2017-07-02 DIAGNOSIS — Z888 Allergy status to other drugs, medicaments and biological substances status: Secondary | ICD-10-CM | POA: Diagnosis not present

## 2017-07-02 DIAGNOSIS — I10 Essential (primary) hypertension: Secondary | ICD-10-CM | POA: Diagnosis present

## 2017-07-02 DIAGNOSIS — M5136 Other intervertebral disc degeneration, lumbar region: Secondary | ICD-10-CM | POA: Diagnosis present

## 2017-07-02 DIAGNOSIS — Z6841 Body Mass Index (BMI) 40.0 and over, adult: Secondary | ICD-10-CM

## 2017-07-02 DIAGNOSIS — N393 Stress incontinence (female) (male): Secondary | ICD-10-CM | POA: Diagnosis present

## 2017-07-02 DIAGNOSIS — Z8349 Family history of other endocrine, nutritional and metabolic diseases: Secondary | ICD-10-CM | POA: Diagnosis not present

## 2017-07-02 DIAGNOSIS — Z87891 Personal history of nicotine dependence: Secondary | ICD-10-CM | POA: Diagnosis not present

## 2017-07-02 DIAGNOSIS — Z90722 Acquired absence of ovaries, bilateral: Secondary | ICD-10-CM | POA: Diagnosis not present

## 2017-07-02 DIAGNOSIS — Z8249 Family history of ischemic heart disease and other diseases of the circulatory system: Secondary | ICD-10-CM

## 2017-07-02 DIAGNOSIS — Z91018 Allergy to other foods: Secondary | ICD-10-CM

## 2017-07-02 DIAGNOSIS — Z881 Allergy status to other antibiotic agents status: Secondary | ICD-10-CM

## 2017-07-02 DIAGNOSIS — Z79890 Hormone replacement therapy (postmenopausal): Secondary | ICD-10-CM

## 2017-07-02 DIAGNOSIS — Z9884 Bariatric surgery status: Secondary | ICD-10-CM | POA: Insufficient documentation

## 2017-07-02 DIAGNOSIS — Z9079 Acquired absence of other genital organ(s): Secondary | ICD-10-CM | POA: Diagnosis not present

## 2017-07-02 DIAGNOSIS — E785 Hyperlipidemia, unspecified: Secondary | ICD-10-CM | POA: Diagnosis present

## 2017-07-02 DIAGNOSIS — F3181 Bipolar II disorder: Secondary | ICD-10-CM | POA: Diagnosis present

## 2017-07-02 DIAGNOSIS — Z9049 Acquired absence of other specified parts of digestive tract: Secondary | ICD-10-CM

## 2017-07-02 DIAGNOSIS — E890 Postprocedural hypothyroidism: Secondary | ICD-10-CM | POA: Diagnosis present

## 2017-07-02 DIAGNOSIS — Z90710 Acquired absence of both cervix and uterus: Secondary | ICD-10-CM

## 2017-07-02 HISTORY — PX: LAPAROSCOPIC GASTRIC SLEEVE RESECTION: SHX5895

## 2017-07-02 LAB — HEMOGLOBIN AND HEMATOCRIT, BLOOD
HCT: 38.8 % (ref 36.0–46.0)
Hemoglobin: 13.5 g/dL (ref 12.0–15.0)

## 2017-07-02 SURGERY — GASTRECTOMY, SLEEVE, LAPAROSCOPIC
Anesthesia: General | Site: Abdomen

## 2017-07-02 MED ORDER — BUPIVACAINE LIPOSOME 1.3 % IJ SUSP
20.0000 mL | Freq: Once | INTRAMUSCULAR | Status: AC
  Administered 2017-07-02: 20 mL
  Filled 2017-07-02: qty 20

## 2017-07-02 MED ORDER — DEXAMETHASONE SODIUM PHOSPHATE 4 MG/ML IJ SOLN: 4.0000 mg | INTRAMUSCULAR | Status: DC

## 2017-07-02 MED ORDER — PANTOPRAZOLE SODIUM 40 MG IV SOLR
40.0000 mg | Freq: Every day | INTRAVENOUS | Status: DC
  Administered 2017-07-02: 40 mg via INTRAVENOUS
  Filled 2017-07-02: qty 40

## 2017-07-02 MED ORDER — HEPARIN SODIUM (PORCINE) 5000 UNIT/ML IJ SOLN
5000.0000 [IU] | INTRAMUSCULAR | Status: AC
  Administered 2017-07-02: 5000 [IU] via SUBCUTANEOUS
  Filled 2017-07-02: qty 1

## 2017-07-02 MED ORDER — ONDANSETRON HCL 4 MG/2ML IJ SOLN: 4.0000 mg | INTRAMUSCULAR | Status: DC | PRN

## 2017-07-02 MED ORDER — GABAPENTIN 250 MG/5ML PO SOLN
200.0000 mg | Freq: Two times a day (BID) | ORAL | Status: DC
  Administered 2017-07-03: 200 mg via ORAL
  Filled 2017-07-02 (×2): qty 4

## 2017-07-02 MED ORDER — LACTATED RINGERS IR SOLN
Status: DC | PRN
  Administered 2017-07-02: 1000 mL

## 2017-07-02 MED ORDER — FENTANYL CITRATE (PF) 100 MCG/2ML IJ SOLN
INTRAMUSCULAR | Status: DC | PRN
  Administered 2017-07-02: 100 ug via INTRAVENOUS

## 2017-07-02 MED ORDER — LOSARTAN POTASSIUM 50 MG PO TABS
100.0000 mg | ORAL_TABLET | Freq: Every day | ORAL | Status: DC
  Administered 2017-07-02 – 2017-07-03 (×2): 100 mg via ORAL
  Filled 2017-07-02 (×2): qty 2

## 2017-07-02 MED ORDER — TRAZODONE HCL 50 MG PO TABS
50.0000 mg | ORAL_TABLET | Freq: Every day | ORAL | Status: DC
  Administered 2017-07-02: 50 mg via ORAL
  Filled 2017-07-02: qty 1

## 2017-07-02 MED ORDER — SERTRALINE HCL 100 MG PO TABS
100.0000 mg | ORAL_TABLET | Freq: Every day | ORAL | Status: DC
  Administered 2017-07-03: 100 mg via ORAL
  Filled 2017-07-02: qty 1

## 2017-07-02 MED ORDER — SCOPOLAMINE 1 MG/3DAYS TD PT72
1.0000 | MEDICATED_PATCH | TRANSDERMAL | Status: DC
  Administered 2017-07-02: 1.5 mg via TRANSDERMAL
  Filled 2017-07-02: qty 1

## 2017-07-02 MED ORDER — SUGAMMADEX SODIUM 500 MG/5ML IV SOLN
INTRAVENOUS | Status: DC | PRN
  Administered 2017-07-02: 350 mg via INTRAVENOUS

## 2017-07-02 MED ORDER — KETAMINE HCL 10 MG/ML IJ SOLN
INTRAMUSCULAR | Status: DC | PRN
  Administered 2017-07-02: 20 mg via INTRAVENOUS

## 2017-07-02 MED ORDER — GABAPENTIN 300 MG PO CAPS
300.0000 mg | ORAL_CAPSULE | Freq: Every day | ORAL | Status: DC
  Administered 2017-07-02: 300 mg via ORAL
  Filled 2017-07-02: qty 1

## 2017-07-02 MED ORDER — FENTANYL CITRATE (PF) 100 MCG/2ML IJ SOLN
INTRAMUSCULAR | Status: AC
  Filled 2017-07-02: qty 2

## 2017-07-02 MED ORDER — BUPIVACAINE-EPINEPHRINE (PF) 0.25% -1:200000 IJ SOLN
INTRAMUSCULAR | Status: AC
  Filled 2017-07-02: qty 30

## 2017-07-02 MED ORDER — FENTANYL CITRATE (PF) 100 MCG/2ML IJ SOLN
25.0000 ug | INTRAMUSCULAR | Status: DC | PRN
  Administered 2017-07-02 (×3): 50 ug via INTRAVENOUS

## 2017-07-02 MED ORDER — LIDOCAINE 2% (20 MG/ML) 5 ML SYRINGE
INTRAMUSCULAR | Status: DC | PRN
  Administered 2017-07-02: 100 mg via INTRAVENOUS

## 2017-07-02 MED ORDER — ROCURONIUM BROMIDE 50 MG/5ML IV SOSY
PREFILLED_SYRINGE | INTRAVENOUS | Status: AC
  Filled 2017-07-02: qty 15

## 2017-07-02 MED ORDER — HYDRALAZINE HCL 20 MG/ML IJ SOLN: 10.0000 mg | INTRAMUSCULAR | Status: DC | PRN

## 2017-07-02 MED ORDER — TRAMADOL HCL 50 MG PO TABS: 50.0000 mg | ORAL_TABLET | Freq: Four times a day (QID) | ORAL | Status: DC | PRN

## 2017-07-02 MED ORDER — SIMETHICONE 80 MG PO CHEW: 80.0000 mg | CHEWABLE_TABLET | Freq: Four times a day (QID) | ORAL | Status: DC | PRN

## 2017-07-02 MED ORDER — CHLORHEXIDINE GLUCONATE 4 % EX LIQD: 60.0000 mL | Freq: Once | CUTANEOUS | Status: DC

## 2017-07-02 MED ORDER — MIDAZOLAM HCL 5 MG/5ML IJ SOLN
INTRAMUSCULAR | Status: DC | PRN
  Administered 2017-07-02: 2 mg via INTRAVENOUS

## 2017-07-02 MED ORDER — MIDAZOLAM HCL 2 MG/2ML IJ SOLN
INTRAMUSCULAR | Status: AC
  Filled 2017-07-02: qty 2

## 2017-07-02 MED ORDER — SODIUM CHLORIDE 0.9 % IV SOLN
INTRAVENOUS | Status: DC
  Administered 2017-07-02 (×2): via INTRAVENOUS

## 2017-07-02 MED ORDER — ONDANSETRON HCL 4 MG/2ML IJ SOLN
INTRAMUSCULAR | Status: DC | PRN
  Administered 2017-07-02: 4 mg via INTRAVENOUS

## 2017-07-02 MED ORDER — ACETAMINOPHEN 500 MG PO TABS
1000.0000 mg | ORAL_TABLET | ORAL | Status: AC
  Administered 2017-07-02: 1000 mg via ORAL
  Filled 2017-07-02: qty 2

## 2017-07-02 MED ORDER — ENOXAPARIN SODIUM 30 MG/0.3ML ~~LOC~~ SOLN
30.0000 mg | Freq: Two times a day (BID) | SUBCUTANEOUS | Status: DC
  Administered 2017-07-02 – 2017-07-03 (×2): 30 mg via SUBCUTANEOUS
  Filled 2017-07-02 (×2): qty 0.3

## 2017-07-02 MED ORDER — DEXAMETHASONE SODIUM PHOSPHATE 10 MG/ML IJ SOLN
INTRAMUSCULAR | Status: DC | PRN
  Administered 2017-07-02: 10 mg via INTRAVENOUS

## 2017-07-02 MED ORDER — LACTATED RINGERS IV SOLN
INTRAVENOUS | Status: DC
  Administered 2017-07-02 (×3): via INTRAVENOUS

## 2017-07-02 MED ORDER — GABAPENTIN 300 MG PO CAPS
300.0000 mg | ORAL_CAPSULE | ORAL | Status: AC
  Administered 2017-07-02: 300 mg via ORAL
  Filled 2017-07-02: qty 1

## 2017-07-02 MED ORDER — LAMOTRIGINE 100 MG PO TABS
200.0000 mg | ORAL_TABLET | Freq: Every day | ORAL | Status: DC
  Administered 2017-07-02: 200 mg via ORAL
  Filled 2017-07-02: qty 2

## 2017-07-02 MED ORDER — CEFOTETAN DISODIUM-DEXTROSE 2-2.08 GM-%(50ML) IV SOLR
2.0000 g | INTRAVENOUS | Status: AC
  Administered 2017-07-02: 2 g via INTRAVENOUS
  Filled 2017-07-02: qty 50

## 2017-07-02 MED ORDER — ROCURONIUM BROMIDE 10 MG/ML (PF) SYRINGE
PREFILLED_SYRINGE | INTRAVENOUS | Status: DC | PRN
  Administered 2017-07-02: 50 mg via INTRAVENOUS
  Administered 2017-07-02: 5 mg via INTRAVENOUS

## 2017-07-02 MED ORDER — PROPOFOL 10 MG/ML IV BOLUS
INTRAVENOUS | Status: DC | PRN
  Administered 2017-07-02: 200 mg via INTRAVENOUS

## 2017-07-02 MED ORDER — PREMIER PROTEIN SHAKE
2.0000 [oz_av] | ORAL | Status: DC
  Administered 2017-07-03 (×5): 2 [oz_av] via ORAL

## 2017-07-02 MED ORDER — PROPOFOL 10 MG/ML IV BOLUS
INTRAVENOUS | Status: AC
  Filled 2017-07-02: qty 20

## 2017-07-02 MED ORDER — BUPIVACAINE LIPOSOME 1.3 % IJ SUSP: INTRAMUSCULAR | Status: DC | PRN

## 2017-07-02 MED ORDER — MORPHINE SULFATE (PF) 2 MG/ML IV SOLN: 1.0000 mg | INTRAVENOUS | Status: DC | PRN

## 2017-07-02 MED ORDER — LIDOCAINE 2% (20 MG/ML) 5 ML SYRINGE
INTRAMUSCULAR | Status: DC | PRN
  Administered 2017-07-02: 1.5 mg/kg/h via INTRAVENOUS

## 2017-07-02 MED ORDER — APREPITANT 40 MG PO CAPS
40.0000 mg | ORAL_CAPSULE | ORAL | Status: AC
  Administered 2017-07-02: 40 mg via ORAL
  Filled 2017-07-02: qty 1

## 2017-07-02 MED ORDER — BUPIVACAINE-EPINEPHRINE (PF) 0.25% -1:200000 IJ SOLN
INTRAMUSCULAR | Status: DC | PRN
  Administered 2017-07-02: 30 mL

## 2017-07-02 MED ORDER — OXYCODONE HCL 5 MG/5ML PO SOLN: 5.0000 mg | ORAL | Status: DC | PRN

## 2017-07-02 MED ORDER — PROPOFOL 10 MG/ML IV BOLUS
INTRAVENOUS | Status: AC
  Filled 2017-07-02: qty 40

## 2017-07-02 MED ORDER — AMLODIPINE BESYLATE 10 MG PO TABS
10.0000 mg | ORAL_TABLET | Freq: Every day | ORAL | Status: DC
  Administered 2017-07-03: 10 mg via ORAL
  Filled 2017-07-02: qty 1

## 2017-07-02 MED ORDER — BUPROPION HCL ER (XL) 300 MG PO TB24
300.0000 mg | ORAL_TABLET | Freq: Every day | ORAL | Status: DC
  Administered 2017-07-03: 300 mg via ORAL
  Filled 2017-07-02: qty 1

## 2017-07-02 MED ORDER — ACETAMINOPHEN 160 MG/5ML PO SOLN
650.0000 mg | Freq: Four times a day (QID) | ORAL | Status: DC
  Administered 2017-07-02 – 2017-07-03 (×4): 650 mg via ORAL
  Filled 2017-07-02 (×4): qty 20.3

## 2017-07-02 MED ORDER — HYDROCHLOROTHIAZIDE 25 MG PO TABS
25.0000 mg | ORAL_TABLET | Freq: Every day | ORAL | Status: DC
  Administered 2017-07-02: 25 mg via ORAL
  Filled 2017-07-02 (×2): qty 1

## 2017-07-02 SURGICAL SUPPLY — 53 items
APPLIER CLIP 5 13 M/L LIGAMAX5 (MISCELLANEOUS)
APPLIER CLIP ROT 13.4 12 LRG (CLIP)
BAG LAPAROSCOPIC 12 15 PORT 16 (BASKET) ×1 IMPLANT
BAG RETRIEVAL 12/15 (BASKET) ×2
BANDAGE ADH SHEER 1  50/CT (GAUZE/BANDAGES/DRESSINGS) ×2 IMPLANT
BENZOIN TINCTURE PRP APPL 2/3 (GAUZE/BANDAGES/DRESSINGS) ×2 IMPLANT
BLADE SURG SZ11 CARB STEEL (BLADE) ×2 IMPLANT
CABLE HIGH FREQUENCY MONO STRZ (ELECTRODE) ×2 IMPLANT
CHLORAPREP W/TINT 26ML (MISCELLANEOUS) ×2 IMPLANT
CLIP APPLIE 5 13 M/L LIGAMAX5 (MISCELLANEOUS) IMPLANT
CLIP APPLIE ROT 13.4 12 LRG (CLIP) IMPLANT
COVER SURGICAL LIGHT HANDLE (MISCELLANEOUS) ×2 IMPLANT
DRAIN CHANNEL 19F RND (DRAIN) IMPLANT
DRAPE UNIVERSAL PACK (DRAPES) ×2 IMPLANT
ELECT REM PT RETURN 15FT ADLT (MISCELLANEOUS) ×2 IMPLANT
EVACUATOR SILICONE 100CC (DRAIN) IMPLANT
GAUZE SPONGE 4X4 12PLY STRL (GAUZE/BANDAGES/DRESSINGS) IMPLANT
GLOVE BIOGEL PI IND STRL 7.0 (GLOVE) ×1 IMPLANT
GLOVE BIOGEL PI INDICATOR 7.0 (GLOVE) ×1
GLOVE SURG SS PI 7.0 STRL IVOR (GLOVE) ×2 IMPLANT
GOWN STRL REUS W/TWL LRG LVL3 (GOWN DISPOSABLE) ×2 IMPLANT
GOWN STRL REUS W/TWL XL LVL3 (GOWN DISPOSABLE) ×6 IMPLANT
GRASPER SUT TROCAR 14GX15 (MISCELLANEOUS) ×2 IMPLANT
HANDLE STAPLE EGIA 4 XL (STAPLE) ×2 IMPLANT
HOVERMATT SINGLE USE (MISCELLANEOUS) ×2 IMPLANT
KIT BASIN OR (CUSTOM PROCEDURE TRAY) ×2 IMPLANT
MARKER SKIN DUAL TIP RULER LAB (MISCELLANEOUS) ×2 IMPLANT
NEEDLE SPNL 22GX3.5 QUINCKE BK (NEEDLE) ×2 IMPLANT
RELOAD EGIA 45 MED/THCK PURPLE (STAPLE) ×2 IMPLANT
RELOAD EGIA 60 MED/THCK PURPLE (STAPLE) ×6 IMPLANT
RELOAD EGIA BLACK ROTIC 45MM (STAPLE) IMPLANT
RELOAD TRI 2.0 60 XTHK VAS SUL (STAPLE) ×4 IMPLANT
SCISSORS LAP 5X45 EPIX DISP (ENDOMECHANICALS) IMPLANT
SET IRRIG TUBING LAPAROSCOPIC (IRRIGATION / IRRIGATOR) ×2 IMPLANT
SHEARS HARMONIC ACE PLUS 45CM (MISCELLANEOUS) ×2 IMPLANT
SLEEVE GASTRECTOMY 40FR VISIGI (MISCELLANEOUS) ×2 IMPLANT
SLEEVE XCEL OPT CAN 5 100 (ENDOMECHANICALS) ×4 IMPLANT
SOLUTION ANTI FOG 6CC (MISCELLANEOUS) ×2 IMPLANT
SPONGE LAP 18X18 X RAY DECT (DISPOSABLE) ×2 IMPLANT
STRIP CLOSURE SKIN 1/2X4 (GAUZE/BANDAGES/DRESSINGS) ×2 IMPLANT
SUT ETHIBOND 0 36 GRN (SUTURE) IMPLANT
SUT ETHILON 2 0 PS N (SUTURE) IMPLANT
SUT MNCRL AB 4-0 PS2 18 (SUTURE) ×2 IMPLANT
SUT VICRYL 0 TIES 12 18 (SUTURE) ×2 IMPLANT
SYR 20CC LL (SYRINGE) ×2 IMPLANT
SYR 50ML LL SCALE MARK (SYRINGE) ×2 IMPLANT
TOWEL OR 17X26 10 PK STRL BLUE (TOWEL DISPOSABLE) ×2 IMPLANT
TOWEL OR NON WOVEN STRL DISP B (DISPOSABLE) ×2 IMPLANT
TROCAR BLADELESS 15MM (ENDOMECHANICALS) ×2 IMPLANT
TROCAR BLADELESS OPT 5 100 (ENDOMECHANICALS) ×2 IMPLANT
TUBING CONNECTING 10 (TUBING) ×2 IMPLANT
TUBING ENDO SMARTCAP PENTAX (MISCELLANEOUS) IMPLANT
TUBING INSUF HEATED (TUBING) ×2 IMPLANT

## 2017-07-02 NOTE — H&P (Signed)
Sharon Hoover is an 61 y.o. female.   Chief Complaint: obesity HPI: 61 yo female with long history of obesity as well as hypertension and hyperlipidemia  Past Medical History:  Diagnosis Date  . Bipolar 2 disorder (HCC)    pscyh hospitaliztion 2006  . DDD (degenerative disc disease), lumbar    uses tens unit  . Hernia    abdominal incisional  . Herniated disc   . Hypercholesteremia   . Hyperlipidemia   . Hypertension   . Impaired fasting glucose   . Obesity   . Stress incontinence, female     Past Surgical History:  Procedure Laterality Date  . ABDOMINAL HYSTERECTOMY    . BLADDER REPAIR    . BLADDER REPAIR    . CHOLECYSTECTOMY    . THYROIDECTOMY    . THYROIDECTOMY    . TOTAL ABDOMINAL HYSTERECTOMY W/ BILATERAL SALPINGOOPHORECTOMY  2001   for ovarian cyst    Family History  Problem Relation Age of Onset  . Other Father        AMI  . Hyperlipidemia Father   . Hypertension Father   . Heart attack Father   . Cancer Other [id number]       breast  . Cancer Mother [id number]       breast  . Depression Sister    Social History:  reports that she quit smoking about 29 years ago. she has never used smokeless tobacco. She reports that she does not drink alcohol or use drugs.  Allergies:  Allergies  Allergen Reactions  . Erythromycin Diarrhea and Nausea And Vomiting    REACTION: GI Upset, has taken Zpak with no side effects  . Grapefruit Diet Or [Extra Strength Grapefruit]   . Miconazole Nitrate     burning  . Other Nausea And Vomiting    Nuts, mushrooms, pineapples, tomatoes, coconut   . Pravastatin Nausea Only    Medications Prior to Admission  Medication Sig Dispense Refill  . acetaminophen (TYLENOL) 500 MG tablet Take 1,000 mg by mouth daily as needed for moderate pain.    Marland Kitchen. acetaminophen-codeine (TYLENOL #3) 300-30 MG tablet take 1 tablet by mouth every 6 hours if needed for pain 60 tablet 1  . amLODipine (NORVASC) 10 MG tablet Take 1 tablet (10 mg total) by mouth daily.  90 tablet 1  . buPROPion (WELLBUTRIN XL) 300 MG 24 hr tablet Take 1 tablet (300 mg total) by mouth daily. Appointment required for future refills. 30 tablet 0  . econazole nitrate 1 % cream apply to affected area once daily (Patient taking differently: Apply 1 application topically daily as needed (yeast infection). ) 255 g 1  . gabapentin (NEURONTIN) 100 MG capsule take 1 capsule by mouth three times a day (Patient taking differently: Take 300 mg by mouth at bedtime. ) 270 capsule 1  . hydrochlorothiazide (HYDRODIURIL) 25 MG tablet Take 1 tablet (25 mg total) by mouth daily. 180 tablet 1  . HYDROcodone-homatropine (HYCODAN) 5-1.5 MG/5ML syrup Take 5 mLs by mouth at bedtime as needed. 120 mL 0  . lamoTRIgine (LAMICTAL) 100 MG tablet Take 2 tablets (200 mg total) by mouth at bedtime. 60 tablet 5  . losartan (COZAAR) 100 MG tablet Take 1 tablet (100 mg total) by mouth daily. 90 tablet 3  . meloxicam (MOBIC) 15 MG tablet Take 15 mg by mouth daily.  2  . Multiple Vitamins-Minerals (ONE-A-DAY WOMENS PETITES) TABS Take 1 tablet by mouth 2 (two) times daily.    Marland Kitchen. Phenylephrine-Guaifenesin 5-200  MG TABS Take 1 tablet by mouth daily as needed (cold symptoms).    . sertraline (ZOLOFT) 100 MG tablet take 1 and 1/2 tablet by mouth once daily (Patient taking differently: Take 100 mg by mouth in the morning once daily) 45 tablet 0  . simvastatin (ZOCOR) 40 MG tablet Take 1 tablet (40 mg total) by mouth at bedtime. (Patient taking differently: Take 40 mg by mouth daily. ) 90 tablet 3  . traZODone (DESYREL) 50 MG tablet Take 50 mg by mouth at bedtime.     . furosemide (LASIX) 40 MG tablet Take 1/2 to 1 tablet daily as needed for edema. 90 tablet 3    No results found for this or any previous visit (from the past 48 hour(s)). No results found.  Review of Systems  Constitutional: Negative for chills and fever.  HENT: Negative for hearing loss.   Eyes: Negative for blurred vision and double vision.   Respiratory: Negative for cough and hemoptysis.   Cardiovascular: Negative for chest pain and palpitations.  Gastrointestinal: Negative for abdominal pain, nausea and vomiting.  Genitourinary: Negative for dysuria and urgency.  Musculoskeletal: Negative for myalgias and neck pain.  Skin: Negative for itching and rash.  Neurological: Negative for dizziness, tingling and headaches.  Endo/Heme/Allergies: Does not bruise/bleed easily.  Psychiatric/Behavioral: Negative for depression and suicidal ideas.    Blood pressure (!) 162/89, pulse 89, temperature 98.2 F (36.8 C), temperature source Oral, resp. rate 18, height 5\' 5"  (1.651 m), weight 109.3 kg (241 lb), SpO2 98 %. Physical Exam  Vitals reviewed. Constitutional: She is oriented to person, place, and time. She appears well-developed and well-nourished.  HENT:  Head: Normocephalic and atraumatic.  Eyes: Conjunctivae and EOM are normal. Pupils are equal, round, and reactive to light.  Neck: Normal range of motion. Neck supple.  Cardiovascular: Normal rate and regular rhythm.  Respiratory: Effort normal and breath sounds normal.  GI: Soft. Bowel sounds are normal. She exhibits no distension. There is no tenderness.  Musculoskeletal: Normal range of motion.  Neurological: She is alert and oriented to person, place, and time.  Skin: Skin is warm and dry.  Psychiatric: She has a normal mood and affect. Her behavior is normal.     Assessment/Plan 61 yo female with long history of obesity presents for sleeve gastrectomy -lap sleeve gastrectomy -ERAS protocol  Sharon PickleLuke Aaron Emran Molzahn, MD 07/02/2017, 10:22 AM

## 2017-07-02 NOTE — Op Note (Signed)
Procedure: Upper GI endoscopy  Description of procedure: Upper GI endoscopy is performed at the completion of laparoscopic sleeve gastrectomy by Dr.  Sheliah HatchKinsinger.  The video endoscope was introduced into the upper esophagus and then passed to the EG junction at about 39 cm. The esophagus appeared normal. The gastric sleeve was entered. The sleeve was tensely distended with air while the outlet was obstructed under saline irrigation by the operating surgeon. There was no evidence of leak. The staple line was intact and without bleeding. The scope was advanced to the antrum and pylorus visualized. There was no stricture or twisting or mucosal abnormality, and particularly no narrowing noted at the incisura.  The pouch was then desufflated and the scope withdrawn.  Sharon SaaBenjamin T Axell Trigueros MD, FACS  07/02/2017, 11:57 AM

## 2017-07-02 NOTE — Progress Notes (Signed)
Discussed post op day goals with patient including ambulation, IS, diet progression, pain, and nausea control.  Questions answered. 

## 2017-07-02 NOTE — Anesthesia Procedure Notes (Signed)
Procedure Name: Intubation Date/Time: 07/02/2017 11:00 AM Performed by: Theodosia QuayKey, Shalisa Mcquade, CRNA Pre-anesthesia Checklist: Patient identified, Emergency Drugs available, Suction available, Patient being monitored and Timeout performed Patient Re-evaluated:Patient Re-evaluated prior to induction Oxygen Delivery Method: Circle system utilized Preoxygenation: Pre-oxygenation with 100% oxygen Induction Type: IV induction Ventilation: Mask ventilation without difficulty Laryngoscope Size: Glidescope and 3 Grade View: Grade II Tube type: Oral Tube size: 7.5 mm Number of attempts: 1 Airway Equipment and Method: Stylet Placement Confirmation: ETT inserted through vocal cords under direct vision,  positive ETCO2,  CO2 detector and breath sounds checked- equal and bilateral Secured at: 21 cm Tube secured with: Tape Dental Injury: Teeth and Oropharynx as per pre-operative assessment  Difficulty Due To: Difficulty was anticipated, Difficult Airway- due to dentition and Difficult Airway- due to limited oral opening

## 2017-07-02 NOTE — Discharge Instructions (Signed)
° ° ° °GASTRIC BYPASS/SLEEVE ° Home Care Instructions ° ° These instructions are to help you care for yourself when you go home. ° °Call: If you have any problems. °• Call [phone] and ask for the surgeon on call °• If you need immediate help, come to the ER at [facility].  °• Tell the ER staff that you are a new post-op gastric bypass or gastric sleeve patient °  °Signs and symptoms to report: • Severe vomiting or nausea °o If you cannot keep down clear liquids for longer than 1 day, call your surgeon  °• Abdominal pain that does not get better after taking your pain medication °• Fever over 100.4° F with chills °• Heart beating over 100 beats a minute °• Shortness of breath at rest °• Chest pain °•  Redness, swelling, drainage, or foul odor at incision (surgical) sites °•  If your incisions open or pull apart °• Swelling or pain in calf (lower leg) °• Diarrhea (Loose bowel movements that happen often), frequent watery, uncontrolled bowel movements °• Constipation, (no bowel movements for 3 days) if this happens: Pick one °o Milk of Magnesia, 2 tablespoons by mouth, 3 times a day for 2 days if needed °o Stop taking Milk of Magnesia once you have a bowel movement °o Call your doctor if constipation continues °Or °o Miralax  (instead of Milk of Magnesia) following the label instructions °o Stop taking Miralax once you have a bowel movement °o Call your doctor if constipation continues °• Anything you think is not normal °  °Normal side effects after surgery: • Unable to sleep at night or unable to focus °• Irritability or moody °• Being tearful (crying) or depressed °These are common complaints, possibly related to your anesthesia medications that put you to sleep, stress of surgery, and change in lifestyle.  This usually goes away a few weeks after surgery.  If these feelings continue, call your primary care doctor. °  °Wound Care: You may have surgical glue, steri-strips, or staples over your incisions after  surgery °• Surgical glue:  Looks like a clear film over your incisions and will wear off a little at a time °• Steri-strips: Strips of tape over your incisions. You may notice a yellowish color on the skin under the steri-strips. This is used to make the   steri-strips stick better. Do not pull the steri-strips off - let them fall off °• Staples: Staples may be removed before you leave the hospital °o If you go home with staples, call [facility], [phone] at for an appointment with your surgeon’s nurse to have staples removed 10 days after surgery. °• Showering: You may shower two (2) days after your surgery unless your surgeon tells you differently °o Wash gently around incisions with warm soapy water, rinse well, and gently pat dry  °o No tub baths until staples are removed, steri-strips fall off or glue is gone.  °  °Medications: • Medications should be liquid or crushed if larger than the size of a dime °• Extended release pills (medication that release a little bit at a time through the day) should NOT be crushed or cut. (examples include XL, ER, DR, SR) °• Depending on the size and number of medications you take, you may need to space (take a few throughout the day)/change the time you take your medications so that you do not over-fill your pouch (smaller stomach) °• Make sure you follow-up with your primary care doctor to   make medication changes needed during rapid weight loss and life-style changes °• If you have diabetes, follow up with the doctor that orders your diabetes medication(s) within one week after surgery and check your blood sugar regularly. °• Do not drive while taking prescription pain medication  °• It is ok to take Tylenol by the bottle instructions with your pain medicine or instead of your pain medicine as needed.  DO NOT TAKE NSAIDS (EXAMPLES OF NSAIDS:  IBUPROFREN/ NAPROXEN)  °Diet:                    First 2 Weeks ° You will see the dietician t about two (2) weeks  after your surgery. The dietician will increase the types of foods you can eat if you are handling liquids well: °• If you have severe vomiting or nausea and cannot keep down clear liquids lasting longer than 1 day, call your surgeon @ ([phone]) °Protein Shake °• Drink at least 2 ounces of shake 5-6 times per day °• Each serving of protein shakes (usually 8 - 12 ounces) should have: °o 15 grams of protein  °o And no more than 5 grams of carbohydrate  °• Goal for protein each day: °o Men = 80 grams per day °o Women = 60 grams per day °• Protein powder may be added to fluids such as non-fat milk or Lactaid milk or unsweetened Soy/Almond milk (limit to 35 grams added protein powder per serving) ° °Hydration °• Slowly increase the amount of water and other clear liquids as tolerated (See Acceptable Fluids) °• Slowly increase the amount of protein shake as tolerated  °•  Sip fluids slowly and throughout the day.  Do not use straws. °• May use sugar substitutes in small amounts (no more than 6 - 8 packets per day; i.e. Splenda) ° °Fluid Goal °• The first goal is to drink at least 8 ounces of protein shake/drink per day (or as directed by the nutritionist); some examples of protein shakes are Syntrax Nectar, Adkins Advantage, EAS Edge HP, and Unjury. See handout from pre-op Bariatric Education Class: °o Slowly increase the amount of protein shake you drink as tolerated °o You may find it easier to slowly sip shakes throughout the day °o It is important to get your proteins in first °• Your fluid goal is to drink 64 - 100 ounces of fluid daily °o It may take a few weeks to build up to this °• 32 oz (or more) should be clear liquids  °And  °• 32 oz (or more) should be full liquids (see below for examples) °• Liquids should not contain sugar, caffeine, or carbonation ° °Clear Liquids: °• Water or Sugar-free flavored water (i.e. Fruit H2O, Propel) °• Decaffeinated coffee or tea (sugar-free) °• Crystal Lite, Wyler’s Lite,  Minute Maid Lite °• Sugar-free Jell-O °• Bouillon or broth °• Sugar-free Popsicle:   *Less than 20 calories each; Limit 1 per day ° °Full Liquids: °Protein Shakes/Drinks + 2 choices per day of other full liquids °• Full liquids must be: °o No More Than 15 grams of Carbs per serving  °o No More Than 3 grams of Fat per serving °• Strained low-fat cream soup (except Cream of Potato or Tomato) °• Non-Fat milk °• Fat-free Lactaid Milk °• Unsweetened Soy Or Unsweetened Almond Milk °• Low Sugar yogurt (Dannon Lite & Fit, Greek yogurt; Oikos Triple Zero; Chobani Simply 100; Yoplait 100 calorie Greek - No Fruit on the Bottom) ° °  °Vitamins   and Minerals • Start 1 day after surgery unless otherwise directed by your surgeon °• 2 Chewable Bariatric Specific Multivitamin / Multimineral Supplement with iron (Example: Bariatric Advantage Multi EA) °• Chewable Calcium with Vitamin D-3 °(Example: 3 Chewable Calcium Plus 600 with Vitamin D-3) °o Take 500 mg three (3) times a day for a total of 1500 mg each day °o Do not take all 3 doses of calcium at one time as it may cause constipation, and you can only absorb 500 mg  at a time  °o Do not mix multivitamins containing iron with calcium supplements; take 2 hours apart °• Menstruating women and those with a history of anemia (a blood disease that causes weakness) may need extra iron °o Talk with your doctor to see if you need more iron °• Do not stop taking or change any vitamins or minerals until you talk to your dietitian or surgeon °• Your Dietitian and/or surgeon must approve all vitamin and mineral supplements °  °Activity and Exercise: Limit your physical activity as instructed by your doctor.  It is important to continue walking at home.  During this time, use these guidelines: °• Do not lift anything greater than ten (10) pounds for at least two (2) weeks °• Do not go back to work or drive until your surgeon says you can °• You may have sex when you feel comfortable  °o It is  VERY important for female patients to use a reliable birth control method; fertility often increases after surgery  °o All hormonal birth control will be ineffective for 30 days after surgery due to medications given during surgery a barrier method must be used. °o Do not get pregnant for at least 18 months °• Start exercising as soon as your doctor tells you that you can °o Make sure your doctor approves any physical activity °• Start with a simple walking program °• Walk 5-15 minutes each day, 7 days per week.  °• Slowly increase until you are walking 30-45 minutes per day °Consider joining our BELT program. [phone] or email [email or web address] °  °Special Instructions Things to remember: °• Use your CPAP when sleeping if this applies to you ° °• [facility] has two free Bariatric Surgery Support Groups that meet monthly °o The 3rd Thursday of each month, 6 pm, [facility]  °o The 2nd Friday of each month, 11:45 am in the private dining room in the basement of  °• It is very important to keep all follow up appointments with your surgeon, dietitian, primary care physician, and behavioral health practitioner °• Routine follow up schedule with your surgeon include appointments at 2-3 weeks, 6-8 weeks, 6 months, and 1 year at a minimum.  Your surgeon may request to see you more often.   °o After the first year, please follow up with your bariatric surgeon and dietitian at least once a year in order to maintain best weight loss results °[facility]: [phone] °[facility]: [phone] °Bariatric Nurse Coordinator: [phone] °  °   Reviewed and Endorsed  °by [facility] Patient Education Committee, June, 2016 °Edits Approved: Aug, 2018 ° ° ° °

## 2017-07-02 NOTE — Transfer of Care (Signed)
Immediate Anesthesia Transfer of Care Note  Patient: Sharon Hoover  Procedure(s) Performed: LAPAROSCOPIC GASTRIC SLEEVE RESECTION, UPPER ENDOSCOPY (N/A Abdomen)  Patient Location: PACU  Anesthesia Type:General  Level of Consciousness: awake, alert  and oriented  Airway & Oxygen Therapy: Patient Spontanous Breathing and Patient connected to face mask oxygen  Post-op Assessment: Report given to RN  Post vital signs: Reviewed and stable  Last Vitals:  Vitals:   07/02/17 0805  BP: (!) 162/89  Pulse: 89  Resp: 18  Temp: 36.8 C  SpO2: 98%    Last Pain:  Vitals:   07/02/17 0805  TempSrc: Oral      Patients Stated Pain Goal: 4 (07/02/17 0942)  Complications: No apparent anesthesia complications

## 2017-07-02 NOTE — Op Note (Signed)
Preop Diagnosis: Obesity Class III  Postop Diagnosis: same  Procedure performed: laparoscopic Sleeve Gastrectomy  Assitant: Jaclynn GuarneriBen Hoxworth  Indications:  The patient is a 61 y.o. year-old morbidly obese female who has been followed in the Bariatric Clinic as an outpatient. This patient was diagnosed with morbid obesity with a BMI of Body mass index is 40.1 kg/m. and significant co-morbidities including hypertension.  The patient was counseled extensively in the Bariatric Outpatient Clinic and after a thorough explanation of the risks and benefits of surgery (including death from complications, bowel leak, infection such as peritonitis and/or sepsis, internal hernia, bleeding, need for blood transfusion, bowel obstruction, organ failure, pulmonary embolus, deep venous thrombosis, wound infection, incisional hernia, skin breakdown, and others entailed on the consent form) and after a compliant diet and exercise program, the patient was scheduled for an elective laparoscopic sleeve gastrectomy.  Description of Operation:  Following informed consent, the patient was taken to the operating room and placed on the operating table in the supine position.  She had previously received prophylactic antibiotics and subcutaneous heparin for DVT prophylaxis in the pre-op holding area.  After induction of general endotracheal anesthesia by the anesthesiologist, the patient underwent placement of sequential compression devices, Foley catheter and an oro-gastric tube.  A timeout was confirmed by the surgery and anesthesia teams.  The patient was adequately padded at all pressure points and placed on a footboard to prevent slippage from the OR table during extremes of position during surgery.  She underwent a routine sterile prep and drape of her entire abdomen.    Next, A transverse incision was made under the left subcostal area and a 5mm optical viewing trocar was introduced into the peritoneal cavity.  Pneumoperitoneum was applied with a high flow and low pressure. A laparoscope was inserted to confirm placement. A moderate amount of adhesions were seen over the mid abdomen. An additional left lateral trocar was inserted. Harmonic was used to remove the adhesions to the mid abdomen. A extraperitoneal block was then placed at the lateral abdominal wall using exparel diluted with marcaine. 5 additional incisions were placed: 1 5mm trocar to the left of the midline. 1 additional 5mm trocar in the left lateral area, 1 12mm trocar in the right mid abdomen, 1 5mm trocar in the right subcostal area, and a Nathanson retractor was placed through a subxiphoid incision.  The fat pad at the GE junction was incised and the gastrodiaphragmatic ligament was divided using the Harmonic scalpel. Next, a hole was created through the lesser omentum along the greater curve of the stomach to enter the lesser sac. The vessels along the greater omentum were  Then ligated and divided using the Harmonic scalpel moving towards the spleen and then short gastric vessels were ligated and divided in the same fashion to fully mobilize the fundus. The left crus was identified to ensure completion of the dissection. Next the antrum was measured and dissection continued inferiorly along the greater curve towards the pylorus and stopped 6cm from the pylorus.   A 40Fr ViSiGi dilator was placed into the esophgaus and along the lesser curve of the stomach and placed on suction. 2 non-reinforced 60mm 4-105mm tristapler(s) followed by 4 60mm 3-584mm tristaplers were used to make the resection along the antrum being sure to stay well away from the angularis by angling the jaws of the stapler towards the greater curve and later completing the resection staying along the ViSiGi and ensuring the fundus was not retained by appropriately retracting it  lateral. Air was inserted through the ViSiGi to perform a leak test showing no bubbles and a neutral lie of  the stomach.  The assistant then went and performed an upper endoscopy and leak test. No bubbles were seen and the sleeve and antrum distended appropriately. The specimen was then placed in an endocatch bag and removed by the 15mm port. The fascia of the 15mm port was closed with a 0 vicryl by suture passer. Hemostasis was ensured. Pneumoperitoneum was evacuated, all ports were removed and all incisions closed with 4-0 monocryl suture in subcuticular fashion. Steristrips and bandaids were put in place for dressing. The patient awoke from anesthesia and was brought to pacu in stable condition. All counts were correct.  Estimated blood loss: <3130ml  Specimens:  Sleeve gastrectomy  Local Anesthesia: 50 ml Exparel:0.5% Marcaine mix  Post-Op Plan:       Pain Management: PO, prn      Antibiotics: Prophylactic      Anticoagulation: Prophylactic, Starting now      Post Op Studies/Consults: Not applicable      Intended Discharge: within 48h      Intended Outpatient Follow-Up: Two Week      Intended Outpatient Studies: Not Applicable      Other: Not Applicable   De BlanchLuke Aaron Hoover  \

## 2017-07-03 ENCOUNTER — Encounter (HOSPITAL_COMMUNITY): Payer: Self-pay | Admitting: General Surgery

## 2017-07-03 LAB — CBC WITH DIFFERENTIAL/PLATELET
BASOS PCT: 0 %
Basophils Absolute: 0 10*3/uL (ref 0.0–0.1)
Eosinophils Absolute: 0 10*3/uL (ref 0.0–0.7)
Eosinophils Relative: 0 %
HEMATOCRIT: 37.9 % (ref 36.0–46.0)
HEMOGLOBIN: 12.7 g/dL (ref 12.0–15.0)
LYMPHS ABS: 1.2 10*3/uL (ref 0.7–4.0)
LYMPHS PCT: 12 %
MCH: 29.5 pg (ref 26.0–34.0)
MCHC: 33.5 g/dL (ref 30.0–36.0)
MCV: 87.9 fL (ref 78.0–100.0)
MONO ABS: 0.3 10*3/uL (ref 0.1–1.0)
MONOS PCT: 3 %
NEUTROS ABS: 8.9 10*3/uL — AB (ref 1.7–7.7)
NEUTROS PCT: 85 %
Platelets: 255 10*3/uL (ref 150–400)
RBC: 4.31 MIL/uL (ref 3.87–5.11)
RDW: 13.1 % (ref 11.5–15.5)
WBC: 10.4 10*3/uL (ref 4.0–10.5)

## 2017-07-03 LAB — COMPREHENSIVE METABOLIC PANEL
ALBUMIN: 3.9 g/dL (ref 3.5–5.0)
ALK PHOS: 80 U/L (ref 38–126)
ALT: 32 U/L (ref 14–54)
ANION GAP: 9 (ref 5–15)
AST: 28 U/L (ref 15–41)
BILIRUBIN TOTAL: 0.7 mg/dL (ref 0.3–1.2)
BUN: 14 mg/dL (ref 6–20)
CALCIUM: 8.9 mg/dL (ref 8.9–10.3)
CO2: 27 mmol/L (ref 22–32)
Chloride: 102 mmol/L (ref 101–111)
Creatinine, Ser: 0.71 mg/dL (ref 0.44–1.00)
GFR calc Af Amer: >60 mL/min (ref >60)
GLUCOSE: 119 mg/dL — AB (ref 65–99)
POTASSIUM: 4.4 mmol/L (ref 3.5–5.1)
Sodium: 138 mmol/L (ref 135–145)
TOTAL PROTEIN: 6.8 g/dL (ref 6.5–8.1)

## 2017-07-03 LAB — TYPE AND SCREEN
ABO/RH(D): O POS
Antibody Screen: NEGATIVE

## 2017-07-03 NOTE — Discharge Summary (Signed)
Physician Discharge Summary  Sharon Hoover JWJ:[id number] DOB: May 18, 1956 DOA: 07/02/2017  PCP: Jomarie Longs, PA-C  Admit date: 07/02/2017 Discharge date: 07/03/2017  Recommendations for Outpatient Follow-up:  1.  (include homehealth, outpatient follow-up instructions, specific recommendations for PCP to follow-up on, etc.)  Follow-up Information    Caralynn Gelber, De Blanch, MD. Go on 07/17/2017.   Specialty:  General Surgery Why:  at [address] Contact information: [address] [place] [address] [phone]        Lekeshia Kram, De Blanch, MD Follow up.   Specialty:  General Surgery Contact information: [address] [place] [address] [place] [address] [phone]          Discharge Diagnoses:  Active Problems:   Morbid obesity (HCC)   Surgical Procedure: Laparoscopic Sleeve Gastrectomy, upper endoscopy  Discharge Condition: Good Disposition: Home  Diet recommendation: Postoperative sleeve gastrectomy diet (liquids only)  Filed Weights   07/02/17 0805  Weight: 109.3 kg (241 lb)     Hospital Course:  The patient was admitted after undergoing laparoscopic sleeve gastrectomy. POD 0 she ambulated well. POD 1 she was started on the water diet protocol and tolerated 600 ml in the first shift. Once meeting the water amount she was advanced to bariatric protein shakes which they tolerated and were discharged home POD 1.  Treatments: surgery: laparoscopic sleeve gastrectomy  Discharge Instructions  Discharge Instructions    Ambulate hourly while awake   Complete by:  As directed    Call MD for:  difficulty breathing, headache or visual disturbances   Complete by:  As directed    Call MD for:  persistant dizziness or light-headedness   Complete by:  As directed    Call MD for:  persistant nausea and vomiting   Complete by:  As directed    Call MD for:  redness, tenderness, or signs of infection (pain, swelling, redness, odor or green/yellow  discharge around incision site)   Complete by:  As directed    Call MD for:  severe uncontrolled pain   Complete by:  As directed    Call MD for:  temperature >101 F   Complete by:  As directed    Diet bariatric full liquid   Complete by:  As directed    Discharge wound care:   Complete by:  As directed    Remove Bandaids tomorrow, ok to shower tomorrow. Steristrips may fall off in 1-3 weeks.   Incentive spirometry   Complete by:  As directed    Perform hourly while awake     Allergies as of 07/03/2017      Reactions   Erythromycin Diarrhea, Nausea And Vomiting   REACTION: GI Upset, has taken Zpak with no side effects   Grapefruit Diet Or [extra Strength Grapefruit]    Miconazole Nitrate    burning   Other Nausea And Vomiting   Nuts, mushrooms, pineapples, tomatoes, coconut    Pravastatin Nausea Only      Medication List    STOP taking these medications   furosemide 40 MG tablet Commonly known as:  LASIX   hydrochlorothiazide 25 MG tablet Commonly known as:  HYDRODIURIL   meloxicam 15 MG tablet Commonly known as:  MOBIC   Phenylephrine-Guaifenesin 5-200 MG Tabs     TAKE these medications   acetaminophen 500 MG tablet Commonly known as:  TYLENOL Take 1,000 mg by mouth daily as needed for moderate pain.   acetaminophen-codeine 300-30 MG tablet Commonly known as:  TYLENOL #3  take 1 tablet by mouth every 6 hours if needed for pain   amLODipine 10 MG tablet Commonly known as:  NORVASC Take 1 tablet (10 mg total) by mouth daily. Notes to patient:  Monitor Blood Pressure Daily and keep a log for primary care physician.  You may need to make changes to your medications with rapid weight loss.     buPROPion 300 MG 24 hr tablet Commonly known as:  WELLBUTRIN XL Take 1 tablet (300 mg total) by mouth daily. Appointment required for future refills.   econazole nitrate 1 % cream apply to affected area once daily What changed:    how much to take  how to take  this  when to take this  reasons to take this  additional instructions   gabapentin 100 MG capsule Commonly known as:  NEURONTIN take 1 capsule by mouth three times a day What changed:    how much to take  how to take this  when to take this  additional instructions   HYDROcodone-homatropine 5-1.5 MG/5ML syrup Commonly known as:  HYCODAN Take 5 mLs by mouth at bedtime as needed.   lamoTRIgine 100 MG tablet Commonly known as:  LAMICTAL Take 2 tablets (200 mg total) by mouth at bedtime.   losartan 100 MG tablet Commonly known as:  COZAAR Take 1 tablet (100 mg total) by mouth daily. Notes to patient:  Monitor Blood Pressure Daily and keep a log for primary care physician.  You may need to make changes to your medications with rapid weight loss.     ONE-A-DAY WOMENS PETITES Tabs Take 1 tablet by mouth 2 (two) times daily.   sertraline 100 MG tablet Commonly known as:  ZOLOFT take 1 and 1/2 tablet by mouth once daily What changed:  See the new instructions.   simvastatin 40 MG tablet Commonly known as:  ZOCOR Take 1 tablet (40 mg total) by mouth at bedtime. What changed:  when to take this   traZODone 50 MG tablet Commonly known as:  DESYREL Take 50 mg by mouth at bedtime.            Discharge Care Instructions  (From admission, onward)        Start     Ordered   07/03/17 0000  Discharge wound care:    Comments:  Remove Bandaids tomorrow, ok to shower tomorrow. Steristrips may fall off in 1-3 weeks.   07/03/17 1522     Follow-up Information    Shakeela Rabadan, De BlanchLuke Aaron, MD. Go on 07/17/2017.   Specialty:  General Surgery Why:  at [address] Contact information: [address] [place] [address] [phone]        Zayna Toste, De BlanchLuke Aaron, MD Follow up.   Specialty:  General Surgery Contact information: [address] [place] [address] [phone]            The results of significant diagnostics from this  hospitalization (including imaging, microbiology, ancillary and laboratory) are listed below for reference.    Significant Diagnostic Studies: No results found.  Labs: Basic Metabolic Panel: Recent Labs  Lab 06/28/17 1006 07/03/17 0551  NA 140 138  K 3.9 4.4  CL 104 102  CO2 28 27  GLUCOSE 95 119*  BUN 19 14  CREATININE 0.87 0.71  CALCIUM 9.3 8.9   Liver Function Tests: Recent Labs  Lab 06/28/17 1006 07/03/17 0551  AST 26 28  ALT 27 32  ALKPHOS 89 80  BILITOT 0.6  0.7  PROT 7.8 6.8  ALBUMIN 4.3 3.9    CBC: Recent Labs  Lab 06/28/17 1006 07/02/17 1235 07/03/17 0551  WBC 8.1  --  10.4  NEUTROABS 5.5  --  8.9*  HGB 13.4 13.5 12.7  HCT 38.5 38.8 37.9  MCV 86.5  --  87.9  PLT 199  --  255    CBG: No results for input(s): GLUCAP in the last 168 hours.  Active Problems:   Morbid obesity (HCC)   Time coordinating discharge: 15min

## 2017-07-03 NOTE — Progress Notes (Signed)
Patient alert and oriented, Post op day 1.  Provided support and encouragement.  Encouraged pulmonary toilet, ambulation and small sips of liquids.  Patient completed 12 ounces of clear fluid and completed most of a protein shake.  All questions answered.  Will continue to monitor.

## 2017-07-05 NOTE — Anesthesia Postprocedure Evaluation (Signed)
Anesthesia Post Note  Patient: Sharon Hoover  Procedure(s) Performed: LAPAROSCOPIC GASTRIC SLEEVE RESECTION, UPPER ENDOSCOPY (N/A Abdomen)     Patient location during evaluation: PACU Anesthesia Type: General Level of consciousness: awake and alert Pain management: pain level controlled Vital Signs Assessment: post-procedure vital signs reviewed and stable Respiratory status: spontaneous breathing, nonlabored ventilation, respiratory function stable and patient connected to nasal cannula oxygen Cardiovascular status: blood pressure returned to baseline and stable Postop Assessment: no apparent nausea or vomiting Anesthetic complications: no    Last Vitals:  Vitals:   07/03/17 1314 07/03/17 1316  BP: 139/64 139/64  Pulse: 60   Resp:    Temp:    SpO2:      Last Pain:  Vitals:   07/03/17 0951  TempSrc: Oral  PainSc: 5                  Kooper Godshall EDWARD

## 2017-07-08 ENCOUNTER — Telehealth (HOSPITAL_COMMUNITY): Payer: Self-pay

## 2017-07-08 NOTE — Telephone Encounter (Signed)
Follow up with bariatric surgical patient to discuss post discharge questions.    1.  Are you having any pain not relieved by pain medication?has not needed pain medication since Sat  2.  How much fluid total fluid intake have you had in the last 24/48 hours?  36 + fluids  3.  How much protein intake have you had in the last 24/48 hours?45 grms  4.  Have you had any trouble making urine?making urine  5.  Have you had nausea that has not been relieved by nausea medication?no nausea  6.  Are you ambulating every hour?yes  7.  Are you passing gas or had a BM?had one BM  8.  Do you know how to contact [facility]? [facility]? N[facility]?yes  9.  Are you taking your vitamins and calcium without difficulty?could not take the bariatric vitamins she bought using Flintstone vitamins,  We discussed trying another option,  I explained the other vitamins and cost for [employer] and Opurity.  She plans to research further on which to purchase and understands she needs a bariatric specific vitamin or to add supplements to the Flintstone.  10. Tell me how your incision looks?  Any redness, open incision, or drainage?

## 2017-07-09 ENCOUNTER — Encounter: Payer: Self-pay | Admitting: Sports Medicine

## 2017-07-09 ENCOUNTER — Ambulatory Visit: Payer: BLUE CROSS/BLUE SHIELD | Admitting: Sports Medicine

## 2017-07-09 DIAGNOSIS — M19041 Primary osteoarthritis, right hand: Secondary | ICD-10-CM | POA: Diagnosis not present

## 2017-07-09 DIAGNOSIS — M19042 Primary osteoarthritis, left hand: Secondary | ICD-10-CM

## 2017-07-09 NOTE — Assessment & Plan Note (Signed)
Previous injection 1 month ago into the right third MCP was effective, no longer painful. Now having pain in the left fourth MCP, previous injection was 8 months ago. This was repeated today, return as needed.

## 2017-07-09 NOTE — Progress Notes (Signed)
  Subjective:    CC: Left hand pain  HPI: For the past several weeks this pleasant 61 year old female has had pain that she localizes over the left fourth metacarpal phalangeal joint, moderate, persistent, localized without radiation, positive for gelling, no trauma, minimal swelling.  Insufficiently controlled with over-the-counter analgesics and NSAIDs.  Past medical history:  Negative.  See flowsheet/record as well for more information.  Surgical history: Negative.  See flowsheet/record as well for more information.  Family history: Negative.  See flowsheet/record as well for more information.  Social history: Negative.  See flowsheet/record as well for more information.  Allergies, and medications have been entered into the medical record, reviewed, and no changes needed.   (To billers/coders, pertinent past medical, social, surgical, family history can be found in problem list, if problem list is marked as reviewed then this indicates that past medical, social, surgical, family history was also reviewed)  Review of Systems: No fevers, chills, night sweats, weight loss, chest pain, or shortness of breath.   Objective:    General: Well Developed, well nourished, and in no acute distress.  Neuro: Alert and oriented x3, extra-ocular muscles intact, sensation grossly intact.  HEENT: Normocephalic, atraumatic, pupils equal round reactive to light, neck supple, no masses, no lymphadenopathy, thyroid nonpalpable.  Skin: Warm and dry, no rashes. Cardiac: Regular rate and rhythm, no murmurs rubs or gallops, no lower extremity edema.  Respiratory: Clear to auscultation bilaterally. Not using accessory muscles, speaking in full sentences. Left hand: Minimally swollen fourth MCP with tenderness, good motion, good strength.  Procedure: Real-time Ultrasound Guided Injection of left fourth MCP Device: GE Logiq E  Verbal informed consent obtained.  Time-out conducted.  Noted no overlying erythema,  induration, or other signs of local infection.  Skin prepped in a sterile fashion.  Local anesthesia: Topical Ethyl chloride.  With sterile technique and under real time ultrasound guidance: 1/2 cc kenalog 40, 1/2 cc lidocaine injected easily Completed without difficulty  Pain immediately resolved suggesting accurate placement of the medication.  Advised to call if fevers/chills, erythema, induration, drainage, or persistent bleeding.  Images permanently stored and available for review in the ultrasound unit.  Impression: Technically successful ultrasound guided injection.  Impression and Recommendations:    Primary osteoarthritis of both hands Previous injection 1 month ago into the right third MCP was effective, no longer painful. Now having pain in the left fourth MCP, previous injection was 8 months ago. This was repeated today, return as needed. ___________________________________________ Ihor Austinhomas J. Benjamin Stainhekkekandam, M.D., ABFM., CAQSM. Primary Care and Sports Medicine [facility]  Adjunct Instructor of Family Medicine  [employer]

## 2017-07-10 ENCOUNTER — Ambulatory Visit: Payer: Self-pay | Admitting: Sports Medicine

## 2017-07-10 ENCOUNTER — Telehealth: Payer: Self-pay | Admitting: Skilled Nursing Facility1

## 2017-07-10 NOTE — Telephone Encounter (Signed)
Pt states she  thinks she is starving to death and wants to eat actual food.  Pt states she could not do the vitamins so she has been doing Flintstones. Dietitian advised she get the appropriate multivitamins.   Dietitian advised she stay on the liquid diet and can try cottage cheese if she feels she absolutely needs to.  Then pt admits she is not even getting the 60 grams of protein in: getting in 45 grams, Dietitian advised she get in the 60 grams of protein before she tries the cottage cheese.  Pt spent a good amount of time talking about all the foods she misses and wishes she could eat.

## 2017-07-18 ENCOUNTER — Encounter: Payer: BLUE CROSS/BLUE SHIELD | Attending: General Surgery | Admitting: Skilled Nursing Facility1

## 2017-07-18 DIAGNOSIS — Z6841 Body Mass Index (BMI) 40.0 and over, adult: Secondary | ICD-10-CM | POA: Insufficient documentation

## 2017-07-18 DIAGNOSIS — E785 Hyperlipidemia, unspecified: Secondary | ICD-10-CM | POA: Diagnosis not present

## 2017-07-18 DIAGNOSIS — Z833 Family history of diabetes mellitus: Secondary | ICD-10-CM | POA: Diagnosis not present

## 2017-07-18 DIAGNOSIS — F319 Bipolar disorder, unspecified: Secondary | ICD-10-CM | POA: Diagnosis not present

## 2017-07-18 DIAGNOSIS — Z713 Dietary counseling and surveillance: Secondary | ICD-10-CM | POA: Insufficient documentation

## 2017-07-18 DIAGNOSIS — G4733 Obstructive sleep apnea (adult) (pediatric): Secondary | ICD-10-CM | POA: Diagnosis not present

## 2017-07-22 ENCOUNTER — Encounter: Payer: Self-pay | Admitting: Skilled Nursing Facility1

## 2017-07-22 NOTE — Progress Notes (Signed)
Bariatric Class:  Appt start time: 1530 end time:  1630.  2 Week Post-Operative Nutrition Class  Patient was seen on 07/18/2017 for Post-Operative Nutrition [facility].   Surgery date: 07/02/2017 Surgery type: sleeve Start weight at [facility]: 256.1 Weight today: 232.8  TANITA  BODY COMP RESULTS  declined   BMI (kg/m^2)    Fat Mass (lbs)    Fat Free Mass (lbs)    Total Body Water (lbs)    The following the learning objectives were met by the patient during this course:  Identifies Phase 3A (Soft, High Proteins) Dietary Goals and will begin from 2 weeks post-operatively to 2 months post-operatively  Identifies appropriate sources of fluids and proteins   States protein recommendations and appropriate sources post-operatively  Identifies the need for appropriate texture modifications, mastication, and bite sizes when consuming solids  Identifies appropriate multivitamin and calcium sources post-operatively  Describes the need for physical activity post-operatively and will follow MD recommendations  States when to call healthcare provider regarding medication questions or post-operative complications  Handouts given during class include:  Phase 3A: Soft, High Protein Diet Handout  Follow-Up Plan: Patient will follow-up at [facility] in 6 weeks for 2 month post-op nutrition visit for diet advancement per MD.

## 2017-08-02 ENCOUNTER — Encounter: Payer: Self-pay | Admitting: Physician Assistant

## 2017-08-02 ENCOUNTER — Ambulatory Visit: Payer: BLUE CROSS/BLUE SHIELD | Admitting: Physician Assistant

## 2017-08-02 VITALS — BP 142/74 | HR 59 | Ht 64.0 in | Wt 227.0 lb

## 2017-08-02 DIAGNOSIS — Z9884 Bariatric surgery status: Secondary | ICD-10-CM

## 2017-08-02 DIAGNOSIS — I10 Essential (primary) hypertension: Secondary | ICD-10-CM | POA: Diagnosis not present

## 2017-08-02 DIAGNOSIS — Z6839 Body mass index (BMI) 39.0-39.9, adult: Secondary | ICD-10-CM

## 2017-08-02 NOTE — Patient Instructions (Addendum)
Stop norvasc. Continue to losartan. mychart reading send me blood pressure readings.

## 2017-08-02 NOTE — Progress Notes (Signed)
Subjective:    Patient ID: Sharon Hoover, female    DOB: 05/15/1956, 62 y.o.   Sharon Hoover: [id number]  HPI  Pt is a 62 yo obese female s/p gastectomy who presents to the clinic to discuss HTN and medication adjustments.   Her starting weight was 261 and had surgery on 12/11. She now weights 227lbs. She is walking but no daily exercise. She is trying to keep to diet suggested by bariatric but admits to eating some bread. Her goal weight is 175mg .   Her goal is to get off her medications except lamictal. She has psychiatrist who is managing her psychiatric medications.   She has also decreased her gabapentin to one at bedtime. Her overall pain is decreasing with weight loss.   She has not taken any BP today. She feels a little dizzy when she does. No CP, palpitations, headaches, or vision changes.   .. Active Ambulatory Problems    Diagnosis Date Noted  . THYROID MASS 07/08/2009  . HYPERCHOLESTEROLEMIA 07/08/2007  . BIPOLAR DISORDER UNSPECIFIED 07/08/2007  . ADJUSTMENT DISORDER WITH DEPRESSED MOOD 08/18/2008  . ESSENTIAL HYPERTENSION, BENIGN 07/08/2007  . ALLERGIC RHINITIS 03/22/2010  . BRONCHITIS, ALLERGIC 05/31/2010  . ASTHMA UNSPECIFIED WITH EXACERBATION 08/18/2008  . IBS 06/07/2008  . FEMALE STRESS INCONTINENCE 07/08/2007  . ACNE VULGARIS 06/07/2008  . DISC DISEASE, LUMBAR 07/08/2007  . CERVICALGIA 11/04/2009  . CHEST WALL PAIN, ACUTE 06/22/2008  . NECK SPASM 09/02/2008  . BACK STRAIN 11/04/2009  . DIZZINESS 08/18/2010  . CONTUSION, HEAD 08/18/2010  . NUMBNESS, ARM 09/27/2010  . Shoulder bursitis 11/15/2010  . Hip pain 11/30/2010  . Pre-diabetes 12/04/2013  . Insomnia 12/04/2013  . Fatigue 12/04/2013  . OSA (obstructive sleep apnea) 12/04/2013  . Trigger finger, acquired 12/31/2013  . Thoracic back pain 02/23/2015  . Left cervical radiculopathy 04/04/2015  . Primary osteoarthritis of both hands 04/04/2015  . Hyperlipidemia 10/24/2015  . Left talar osteochondral defect  with tibial fracture 03/05/2017  . S/P laparoscopic sleeve gastrectomy 07/02/2017  . Class 2 severe obesity due to excess calories with serious comorbidity and body mass index (BMI) of 39.0 to 39.9 in adult [facility](HCC) 08/02/2017   Resolved Ambulatory Problems    Diagnosis Date Noted  . Acute bronchitis 01/02/2008  . CELLULITIS 11/09/2009  . TOE PAIN 12/22/2008  . Wheezing 09/07/2008  . FRACTURE, FINGER, DISTAL 07/08/2009  . FRACTURE, TOE 12/23/2008  . MUSCLE STRAIN, HAMSTRING MUSCLE 03/22/2010  . Obesity, Class III, BMI 40-49.9 (morbid obesity) (HCC) 11/15/2010  . Morbid obesity due to excess calories (HCC) 10/24/2015  . Abnormal weight gain 10/24/2015  . Closed displaced fracture of distal phalanx of right great toe 06/11/2016  . Displaced fracture of proximal phalanx of right great toe with delayed healing 07/02/2016  . Morbid obesity (HCC) 07/02/2017   Past Medical History:  Diagnosis Date  . Bipolar 2 disorder (HCC)   . DDD (degenerative disc disease), lumbar   . Hernia   . Herniated disc   . Hypercholesteremia   . Hyperlipidemia   . Hypertension   . Impaired fasting glucose   . Obesity   . Stress incontinence, female           Review of Systems  All other systems reviewed and are negative.      Objective:   Physical Exam  Constitutional: She is oriented to person, place, and time. She appears well-developed and well-nourished.  HENT:  Head: Normocephalic and atraumatic.  Neck: Normal range of motion. Neck supple.  Cardiovascular: Normal rate, regular rhythm and normal heart sounds.  Pulmonary/Chest: Effort normal and breath sounds normal.  Neurological: She is alert and oriented to person, place, and time.  Psychiatric: She has a normal mood and affect. Her behavior is normal.          Assessment & Plan:  Marland KitchenMarland KitchenMelissa was seen today for hypertension.  Diagnoses and all orders for this visit:  Essential hypertension, benign  S/P laparoscopic sleeve  gastrectomy  Class 2 severe obesity due to excess calories with serious comorbidity and body mass index (BMI) of 39.0 to 39.9 in adult (HCC)   BMI under 40. Great job. Continue with diet and weight management.   BP is not at goal today but she has not taken any medications. Stop norvasc but continue losartan. As you lose weight we may be able to discontinue this as well. Keep me updated with BP readings via mychart.   Follow up in 3 months.

## 2017-08-29 ENCOUNTER — Encounter: Payer: Self-pay | Admitting: Skilled Nursing Facility1

## 2017-08-29 ENCOUNTER — Encounter: Payer: BLUE CROSS/BLUE SHIELD | Attending: General Surgery | Admitting: Skilled Nursing Facility1

## 2017-08-29 DIAGNOSIS — E785 Hyperlipidemia, unspecified: Secondary | ICD-10-CM | POA: Diagnosis not present

## 2017-08-29 DIAGNOSIS — G4733 Obstructive sleep apnea (adult) (pediatric): Secondary | ICD-10-CM | POA: Insufficient documentation

## 2017-08-29 DIAGNOSIS — Z833 Family history of diabetes mellitus: Secondary | ICD-10-CM | POA: Insufficient documentation

## 2017-08-29 DIAGNOSIS — F319 Bipolar disorder, unspecified: Secondary | ICD-10-CM | POA: Insufficient documentation

## 2017-08-29 DIAGNOSIS — Z6841 Body Mass Index (BMI) 40.0 and over, adult: Secondary | ICD-10-CM | POA: Diagnosis not present

## 2017-08-29 DIAGNOSIS — Z6839 Body mass index (BMI) 39.0-39.9, adult: Secondary | ICD-10-CM

## 2017-08-29 DIAGNOSIS — Z713 Dietary counseling and surveillance: Secondary | ICD-10-CM | POA: Diagnosis not present

## 2017-08-29 NOTE — Progress Notes (Signed)
Follow-up visit:  8 Weeks Post-Operative sleeve gastrectomy Surgery  Primary concerns today: Post-operative Bariatric Surgery Nutrition Management.  Pt is a Veterinary surgeonrealtor.  Pt states she has had very frequent heart burn. Pt states she is not eating right but she is trying (for example nacho cheese and ice cream and toast with strawberry preserve and chips). Pt admits to eating too fast. Pt states she was very mad at herself for having ice cream. Pt states she has had pasta as well. Pt states she has been eating things she should not because she was constipated. Pt states with her job and taking the kids places she does not have time to get to the gym more often throughout the week. Pt states she is very happy with her success and has given a lot of clothes away because she has lost so much weight. Pt asked when she could add other foods back in. Pt states she wants all of the foods. Pt wanted to know if she could have alcohol because she has free drink tickets at a banquet.  Pt has not been following the prescribed-recommended diet.  Pt began to cry when it was pinpointed she is taking care of everyone but herself and pt admits she has struggled with that. Pt is talkative.   Surgery date: 07/02/2017 Surgery type: sleeve [facility] Weight today: 218 Weight change: 14.8  TANITA  BODY COMP RESULTS  08/29/2017   BMI (kg/m^2) 37.4   Fat Mass (lbs) 104.8   Fat Free Mass (lbs) 113.2   Total Body Water (lbs) 81.4   24-hr recall: B (AM): half a cup of yogurt or cheese Snk (1.5 hours after breakfast):  toast with fruit spread and egg  L (PM): pizza or half a sandwich with bread Snk (PM): cookie D (PM): pizza or hamburger with swiss cheese and garlic knot Snk (PM):   Fluid intake: water with lemon  Estimated total protein intake: 60+  Medications: See List Supplementation: unable to ask  Using straws: no Drinking while eating: no Having you been chewing  well:no Chewing/swallowing difficulties: no Changes in vision: no Changes to mood/headaches: no Hair loss/Cahnges to skin/Changes to nails: no Any difficulty focusing or concentrating: no Sweating: no Dizziness/Lightheaded:  Palpitations: no  Carbonated beverages: no N/V/D/C/GAS: no: excessive heartburn  Abdominal Pain: no Dumping syndrome: no  Recent physical activity:  2 days at a week at the gym  Progress Towards Goal(s):  In progress.  Handouts given during visit include:  Non-starchy veggies + protein    Nutritional Diagnosis:  [place]-3.3 Overweight/obesity related to past poor dietary habits and physical inactivity as evidenced by patient w/ recent sleeve surgery following dietary guidelines for continued weight loss.  Intervention:  Nutrition counseling. Goals: -Work on identifying when you are eating emotionally  Teaching Method Utilized:  Visual Auditory Hands on  Barriers to learning/adherence to lifestyle change: emotional eating  Demonstrated degree of understanding via:  Teach Back   Monitoring/Evaluation:  Dietary intake, exercise, and body weight.

## 2017-09-11 ENCOUNTER — Ambulatory Visit: Payer: Self-pay | Admitting: Skilled Nursing Facility1

## 2017-09-17 ENCOUNTER — Ambulatory Visit: Payer: BLUE CROSS/BLUE SHIELD | Admitting: Physician Assistant

## 2017-09-17 ENCOUNTER — Encounter: Payer: Self-pay | Admitting: Physician Assistant

## 2017-09-17 VITALS — BP 161/81 | HR 67 | Ht 64.0 in | Wt 220.0 lb

## 2017-09-17 DIAGNOSIS — I10 Essential (primary) hypertension: Secondary | ICD-10-CM | POA: Diagnosis not present

## 2017-09-17 DIAGNOSIS — E782 Mixed hyperlipidemia: Secondary | ICD-10-CM | POA: Diagnosis not present

## 2017-09-17 DIAGNOSIS — R3915 Urgency of urination: Secondary | ICD-10-CM | POA: Diagnosis not present

## 2017-09-17 DIAGNOSIS — R300 Dysuria: Secondary | ICD-10-CM

## 2017-09-17 LAB — POCT URINALYSIS DIPSTICK
Bilirubin, UA: NEGATIVE
Glucose, UA: NEGATIVE
Ketones, UA: NEGATIVE
LEUKOCYTES UA: NEGATIVE
Nitrite, UA: NEGATIVE
PH UA: 7 (ref 5.0–8.0)
PROTEIN UA: NEGATIVE
RBC UA: NEGATIVE
Spec Grav, UA: 1.02 (ref 1.010–1.025)
UROBILINOGEN UA: 1 U/dL

## 2017-09-17 MED ORDER — LOSARTAN POTASSIUM-HCTZ 100-25 MG PO TABS: 1.0000 | ORAL_TABLET | Freq: Every day | ORAL | 1 refills | Status: DC

## 2017-09-17 MED ORDER — PHENAZOPYRIDINE HCL 200 MG PO TABS: 200.0000 mg | ORAL_TABLET | Freq: Three times a day (TID) | ORAL | 0 refills | Status: AC

## 2017-09-17 NOTE — Progress Notes (Signed)
Subjective:     Patient ID: Sharon Hoover, female   DOB: May 28, 1956, 62 y.o.   MRN: [id number]  HPI   Sharon Hoover is a 62 year old female presented to the office with complain of mild burning during urination and feeling of increasing urinary urgency and frequency for 3 days. Patient reports mild body ache since the beginning of her symptoms. Patient denied fever, chills, night sweats, cough, congestion, SOB. Patient denies noticing any blood in the urine. Patient denies nausea, vomiting, diarrhea, acute change in bowel movement. Patient denies any significant change in her health status. Patient denies feeling of urinary retention or flank pain. Patient denies significant change in food or fluid intake recently.  Patient is S/P bariatric surgery, reports being physically active, motivated to achieve goal weight, and overall feeling good with reduced weight. She also tries to come off of most of medications. Patient checks her blood pressure regularly which runs between 137-157 mmHg systolic. She denies any CP, palpitations, headaches or vision changes.   She did stop the zocor on her own and wishes to have cholesterol checked today.   .. Active Ambulatory Problems    Diagnosis Date Noted  . THYROID MASS 07/08/2009  . HYPERCHOLESTEROLEMIA 07/08/2007  . BIPOLAR DISORDER UNSPECIFIED 07/08/2007  . ADJUSTMENT DISORDER WITH DEPRESSED MOOD 08/18/2008  . ESSENTIAL HYPERTENSION, BENIGN 07/08/2007  . ALLERGIC RHINITIS 03/22/2010  . BRONCHITIS, ALLERGIC 05/31/2010  . ASTHMA UNSPECIFIED WITH EXACERBATION 08/18/2008  . IBS 06/07/2008  . FEMALE STRESS INCONTINENCE 07/08/2007  . ACNE VULGARIS 06/07/2008  . DISC DISEASE, LUMBAR 07/08/2007  . CERVICALGIA 11/04/2009  . CHEST WALL PAIN, ACUTE 06/22/2008  . NECK SPASM 09/02/2008  . BACK STRAIN 11/04/2009  . DIZZINESS 08/18/2010  . CONTUSION, HEAD 08/18/2010  . NUMBNESS, ARM 09/27/2010  . Shoulder bursitis 11/15/2010  . Hip pain 11/30/2010  .  Pre-diabetes 12/04/2013  . Insomnia 12/04/2013  . Fatigue 12/04/2013  . OSA (obstructive sleep apnea) 12/04/2013  . Trigger finger, acquired 12/31/2013  . Thoracic back pain 02/23/2015  . Left cervical radiculopathy 04/04/2015  . Primary osteoarthritis of both hands 04/04/2015  . Hyperlipidemia 10/24/2015  . Left talar osteochondral defect with tibial fracture 03/05/2017  . S/P laparoscopic sleeve gastrectomy 07/02/2017  . Class 2 severe obesity due to excess calories with serious comorbidity and body mass index (BMI) of 39.0 to 39.9 in adult [facility]) 08/02/2017   Resolved Ambulatory Problems    Diagnosis Date Noted  . Acute bronchitis 01/02/2008  . CELLULITIS 11/09/2009  . TOE PAIN 12/22/2008  . Wheezing 09/07/2008  . FRACTURE, FINGER, DISTAL 07/08/2009  . FRACTURE, TOE 12/23/2008  . MUSCLE STRAIN, HAMSTRING MUSCLE 03/22/2010  . Obesity, Class III, BMI 40-49.9 (morbid obesity) (HCC) 11/15/2010  . Morbid obesity due to excess calories (HCC) 10/24/2015  . Abnormal weight gain 10/24/2015  . Closed displaced fracture of distal phalanx of right great toe 06/11/2016  . Displaced fracture of proximal phalanx of right great toe with delayed healing 07/02/2016  . Morbid obesity (HCC) 07/02/2017   Past Medical History:  Diagnosis Date  . Bipolar 2 disorder (HCC)   . DDD (degenerative disc disease), lumbar   . Hernia   . Herniated disc   . Hypercholesteremia   . Hyperlipidemia   . Hypertension   . Impaired fasting glucose   . Obesity   . Stress incontinence, female    Review of Systems   As stated in HPI    Objective:   Physical Exam  Constitutional: She is oriented  to person, place, and time. She appears well-developed and well-nourished. No distress.  HENT:  Head: Normocephalic and atraumatic.  Eyes: Pupils are equal, round, and reactive to light. Right eye exhibits no discharge. Left eye exhibits no discharge.  Neck: Neck supple. No thyromegaly present.  Cardiovascular:  Normal rate, regular rhythm and normal heart sounds. Exam reveals no gallop and no friction rub.  No murmur heard. Pulmonary/Chest: Breath sounds normal. She has no wheezes. She has no rales. She exhibits no tenderness.  Abdominal: She exhibits no distension and no mass. There is no rebound and no guarding.  Mild mid suprapubic tenderness  Neurological: She is alert and oriented to person, place, and time.  Psychiatric: She has a normal mood and affect. Her behavior is normal. Judgment and thought content normal.     Assessment:    Marland Kitchen.Marland Kitchen.Diagnoses and all orders for this visit:  Urinary urgency -     POCT urinalysis dipstick -     Urine Culture  Essential hypertension, benign  Other orders -     phenazopyridine (PYRIDIUM) 200 MG tablet; Take 1 tablet (200 mg total) by mouth 3 (three) times daily for 2 days.      Plan:     .Marland Kitchen. Results for orders placed or performed in visit on 09/17/17  POCT urinalysis dipstick  Result Value Ref Range   Color, UA yellow    Clarity, UA clear    Glucose, UA negative    Bilirubin, UA negative    Ketones, UA negative    Spec Grav, UA 1.020 1.010 - 1.025   Blood, UA negative    pH, UA 7.0 5.0 - 8.0   Protein, UA negative    Urobilinogen, UA 1.0 0.2 or 1.0 E.U./dL   Nitrite, UA negative    Leukocytes, UA Negative Negative   Appearance     Odor      Results of urine dipstick does not shows signs of UTI, urine sample was sent to lab for culture with results expected by tomorrow. Utility, benefits, and side effects  of pyridium is discussed with patient to manage her symptoms. Patient agreed to take the medication. Patient will be notified about any change in her treatment plan once urine culture is available. Please call us if symptoms worsen or fever develops.   Patient reported stopping her statin and wanted to know her cholesterol level. Will order lipid panel.   Patient's blood pressure during this office visit was 161/81. Added HCTZ 25mg  to  the losaartan 100mg .  Importance of optimal blood pressure control is discussed including adjustment of her current blood pressure medication. Follow up in 4 weeks for BP recheck.

## 2017-09-18 ENCOUNTER — Ambulatory Visit: Payer: Self-pay | Admitting: Skilled Nursing Facility1

## 2017-09-19 LAB — URINE CULTURE
MICRO NUMBER:: 90251032
SPECIMEN QUALITY: ADEQUATE

## 2017-09-20 NOTE — Progress Notes (Signed)
Call pt: urine culture showed no signs of bacterial infections. There was some external contamination with multiple organisms. How are you symptoms?

## 2017-09-24 ENCOUNTER — Encounter: Payer: BLUE CROSS/BLUE SHIELD | Attending: General Surgery | Admitting: Skilled Nursing Facility1

## 2017-09-24 ENCOUNTER — Encounter: Payer: Self-pay | Admitting: Skilled Nursing Facility1

## 2017-09-24 DIAGNOSIS — Z713 Dietary counseling and surveillance: Secondary | ICD-10-CM | POA: Insufficient documentation

## 2017-09-24 DIAGNOSIS — G4733 Obstructive sleep apnea (adult) (pediatric): Secondary | ICD-10-CM | POA: Insufficient documentation

## 2017-09-24 DIAGNOSIS — F319 Bipolar disorder, unspecified: Secondary | ICD-10-CM | POA: Insufficient documentation

## 2017-09-24 DIAGNOSIS — Z6839 Body mass index (BMI) 39.0-39.9, adult: Secondary | ICD-10-CM

## 2017-09-24 DIAGNOSIS — Z6841 Body Mass Index (BMI) 40.0 and over, adult: Secondary | ICD-10-CM | POA: Insufficient documentation

## 2017-09-24 DIAGNOSIS — Z833 Family history of diabetes mellitus: Secondary | ICD-10-CM | POA: Insufficient documentation

## 2017-09-24 DIAGNOSIS — E785 Hyperlipidemia, unspecified: Secondary | ICD-10-CM | POA: Insufficient documentation

## 2017-09-24 NOTE — Progress Notes (Signed)
Sleeve gastrectomy Surgery  Primary concerns today: Post-operative Bariatric Surgery Nutrition Management.  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  The book Bariatric mindset helping her feel better with emotional eating. Pt states her arthritis medicine gives her indigestion. Pt states she tries to park farther away from buildings and increase her speed on the treadmill. Pt states she will not take the bariatric specific multivitamins because of her allergies and not wanting to chew a big pill. Pt states Foods are not allergies they are triggers from a childhood experiences. Pt states she gets bored with what she eats. Pt states she does not like Dr. Cyndia SkeetersLurey and does not want to work with him. Pt was given [employer] health multivitamin to try.  Pt was given a specific meal plan to follow including portions and specific examples.  Surgery date: 07/02/2017 Surgery type: sleeve [facility] Weight today: 214.4 Weight change: 4  TANITA  BODY COMP RESULTS  08/29/2017 09/24/2017   BMI (kg/m^2) 37.4 36.8   Fat Mass (lbs) 104.8 107.6   Fat Free Mass (lbs) 113.2 106.8   Total Body Water (lbs) 81.4 76.8   24-hr recall: B (AM): half a cup of yogurt or cheese Snk (1.5 hours after breakfast):  toast with fruit spread and egg  L (PM): pizza or half a sandwich with bread Snk (PM): cookies D (PM): pizza or hamburger with swiss cheese and garlic knot Snk (PM):   Fluid intake: water with lemon  Estimated total protein intake: 60+  Medications: See List Supplementation: flinestone   Using straws: no Drinking while eating: no Having you been chewing well:no Chewing/swallowing difficulties: no Changes in vision: no Changes to mood/headaches: no Hair loss/Cahnges to skin/Changes to nails: no Any difficulty focusing or concentrating: no Sweating: no Dizziness/Lightheaded:  Palpitations: no  Carbonated beverages: no N/V/D/C/GAS: no: excessive heartburn  Abdominal Pain: no Dumping  syndrome: no  Recent physical activity:  2 days at a week at the gym  Progress Towards Goal(s):  In progress.  Handouts given during visit include:  Non-starchy veggies + protein    Nutritional Diagnosis:  [place]-3.3 Overweight/obesity related to past poor dietary habits and physical inactivity as evidenced by patient w/ recent sleeve surgery following dietary guidelines for continued weight loss.  Intervention:  Nutrition counseling. Goals: -You need 18mg  of iron, 12mg  thiamin, 350mcg B12, vitamin D 3000 IU in addition to double does of flinestones  -[email or web address] [phone]) Teaching Method Utilized:  Visual Auditory Hands on  Barriers to learning/adherence to lifestyle change: emotional eating  Demonstrated degree of understanding via:  Teach Back   Monitoring/Evaluation:  Dietary intake, exercise, and body weight.

## 2017-09-24 NOTE — Patient Instructions (Addendum)
-  You need 18mg  of iron, 12mg  thiamin, 350mcg B12, vitamin D 3000 IU in addition to double does of flinestones   PackageNews.[email or web address] [phone])

## 2017-11-06 ENCOUNTER — Ambulatory Visit: Payer: Self-pay | Admitting: Skilled Nursing Facility1

## 2017-11-19 ENCOUNTER — Encounter: Payer: Self-pay | Admitting: Physician Assistant

## 2017-11-19 ENCOUNTER — Ambulatory Visit (INDEPENDENT_AMBULATORY_CARE_PROVIDER_SITE_OTHER): Payer: BLUE CROSS/BLUE SHIELD | Admitting: Physician Assistant

## 2017-11-19 VITALS — BP 114/60 | HR 68 | Temp 98.8°F | Wt 204.1 lb

## 2017-11-19 DIAGNOSIS — M549 Dorsalgia, unspecified: Secondary | ICD-10-CM | POA: Diagnosis not present

## 2017-11-19 DIAGNOSIS — R2241 Localized swelling, mass and lump, right lower limb: Secondary | ICD-10-CM

## 2017-11-19 DIAGNOSIS — M5137 Other intervertebral disc degeneration, lumbosacral region: Secondary | ICD-10-CM | POA: Diagnosis not present

## 2017-11-19 DIAGNOSIS — N62 Hypertrophy of breast: Secondary | ICD-10-CM

## 2017-11-19 DIAGNOSIS — M546 Pain in thoracic spine: Secondary | ICD-10-CM

## 2017-11-19 DIAGNOSIS — G8929 Other chronic pain: Secondary | ICD-10-CM

## 2017-11-19 MED ORDER — ACETAMINOPHEN-CODEINE #3 300-30 MG PO TABS
ORAL_TABLET | ORAL | 1 refills | Status: AC
Start: 2017-11-19 — End: ?

## 2017-11-19 NOTE — Progress Notes (Signed)
Subjective:    Patient ID: Sharon Hoover, female    DOB: September 06, 1955, 62 y.o.   MRN: [id number]  HPI  Pt is a 62 yo pleasant obese female who presents to the clinic to discuss nodule on shin of right anterior leg. Not painful, no swelling, no bruising. Does not remember an injury. No concern except she feels it. She has noticed for last few weeks. Nothing done to make better or worse.   She has lost another 10lbs after gastric sleeve. She continues to try to keep to health diet. Her breast have decreased in size to 46 DDD to 44 DD. Though weight is decreasing her mid back pain is increasing. She feels like now that her breast are sagging that it is creating more back stain. Not able to take MObic due to gastric sleeve.   .. Active Ambulatory Problems    Diagnosis Date Noted  . THYROID MASS 07/08/2009  . HYPERCHOLESTEROLEMIA 07/08/2007  . BIPOLAR DISORDER UNSPECIFIED 07/08/2007  . ADJUSTMENT DISORDER WITH DEPRESSED MOOD 08/18/2008  . ESSENTIAL HYPERTENSION, BENIGN 07/08/2007  . ALLERGIC RHINITIS 03/22/2010  . BRONCHITIS, ALLERGIC 05/31/2010  . ASTHMA UNSPECIFIED WITH EXACERBATION 08/18/2008  . IBS 06/07/2008  . FEMALE STRESS INCONTINENCE 07/08/2007  . ACNE VULGARIS 06/07/2008  . DISC DISEASE, LUMBAR 07/08/2007  . CERVICALGIA 11/04/2009  . CHEST WALL PAIN, ACUTE 06/22/2008  . NECK SPASM 09/02/2008  . BACK STRAIN 11/04/2009  . DIZZINESS 08/18/2010  . CONTUSION, HEAD 08/18/2010  . NUMBNESS, ARM 09/27/2010  . Shoulder bursitis 11/15/2010  . Hip pain 11/30/2010  . Pre-diabetes 12/04/2013  . Insomnia 12/04/2013  . Fatigue 12/04/2013  . OSA (obstructive sleep apnea) 12/04/2013  . Trigger finger, acquired 12/31/2013  . Thoracic back pain 02/23/2015  . Left cervical radiculopathy 04/04/2015  . Primary osteoarthritis of both hands 04/04/2015  . Hyperlipidemia 10/24/2015  . Left talar osteochondral defect with tibial fracture 03/05/2017  . S/P laparoscopic sleeve gastrectomy  07/02/2017  . Class 2 severe obesity due to excess calories with serious comorbidity and body mass index (BMI) of 39.0 to 39.9 in adult [facility]) 08/02/2017   Resolved Ambulatory Problems    Diagnosis Date Noted  . Acute bronchitis 01/02/2008  . CELLULITIS 11/09/2009  . TOE PAIN 12/22/2008  . Wheezing 09/07/2008  . FRACTURE, FINGER, DISTAL 07/08/2009  . FRACTURE, TOE 12/23/2008  . MUSCLE STRAIN, HAMSTRING MUSCLE 03/22/2010  . Obesity, Class III, BMI 40-49.9 (morbid obesity) (HCC) 11/15/2010  . Morbid obesity due to excess calories (HCC) 10/24/2015  . Abnormal weight gain 10/24/2015  . Closed displaced fracture of distal phalanx of right great toe 06/11/2016  . Displaced fracture of proximal phalanx of right great toe with delayed healing 07/02/2016  . Morbid obesity (HCC) 07/02/2017   Past Medical History:  Diagnosis Date  . Bipolar 2 disorder (HCC)   . DDD (degenerative disc disease), lumbar   . Hernia   . Herniated disc   . Hypercholesteremia   . Hyperlipidemia   . Hypertension   . Impaired fasting glucose   . Obesity   . Stress incontinence, female       Review of Systems  All other systems reviewed and are negative.      Objective:   Physical Exam  Constitutional: She is oriented to person, place, and time. She appears well-developed and well-nourished.  Obese.   HENT:  Head: Normocephalic and atraumatic.  Eyes: Pupils are equal, round, and reactive to light. EOM are normal.  Neck: Normal range of motion. Neck  supple.  Cardiovascular: Normal rate and regular rhythm.  Pulmonary/Chest: Effort normal and breath sounds normal.  Musculoskeletal:  Bilateral strength lower ext 5/5.  Right anterior leg multiple small nodules palpated in skin bilateral sides of tibia.  No swelling, warmth, tenderness, or bruising.   Neurological: She is alert and oriented to person, place, and time.  Skin: No rash noted. No erythema. No pallor.          Assessment & Plan:   Marland KitchenMarland KitchenDiagnoses and all orders for this visit:  Skin lump of leg, right  Upper back pain -     Ambulatory referral to Physical Therapy -     Ambulatory referral to Plastic Surgery  Large breasts -     Ambulatory referral to Physical Therapy -     Ambulatory referral to Plastic Surgery  DISC DISEASE, LUMBAR -     acetaminophen-codeine (TYLENOL #3) 300-30 MG tablet; take 1 tablet by mouth every 6 hours if needed for pain  Chronic bilateral thoracic back pain -     acetaminophen-codeine (TYLENOL #3) 300-30 MG tablet; take 1 tablet by mouth every 6 hours if needed for pain -     Ambulatory referral to Physical Therapy -     Ambulatory referral to Plastic Surgery   Reassurance that the leg nodules look like benign lipomas. Continue to watch if growing or becoming painful alert office.   Last refill on tylenol codiene was 11/2016. Did not have time to put on pain contract. Refilled again today. As needed only.   Discussed referrals to Pt and plastics. I do think PT to help the upper/mid back pain. Discussed heat, biofreeze, massage. Will make referral for breast reduction.   Marland Kitchen.Spent 30 minutes with patient and greater than 50 percent of visit spent counseling patient regarding treatment plan.

## 2017-12-03 ENCOUNTER — Ambulatory Visit: Payer: Self-pay

## 2017-12-04 ENCOUNTER — Ambulatory Visit: Payer: Self-pay | Admitting: Rehabilitative and Restorative Service Providers"

## 2017-12-05 ENCOUNTER — Telehealth: Payer: Self-pay

## 2017-12-05 MED ORDER — DICLOFENAC SODIUM 2 % TD SOLN: TRANSDERMAL | 1 refills | Status: DC

## 2017-12-05 NOTE — Telephone Encounter (Signed)
It has to be mail order. They should call her and confirm address and quote price. Make sure picking up phone.

## 2017-12-05 NOTE — Telephone Encounter (Signed)
Called pt to relay information and she wants to know why mail order? Only her daughter has mail order acct.? She will be expecting the phone call from mail order though. Thanks.

## 2017-12-05 NOTE — Telephone Encounter (Signed)
Pennsaid is very expensive through local pharmacy. We have to send to speciality [facility]. It usually is 10 dollars through them.

## 2017-12-05 NOTE — Telephone Encounter (Signed)
Sent pennsaid to mail order pharmacy.

## 2017-12-05 NOTE — Telephone Encounter (Signed)
Pt notified   thanks

## 2017-12-26 ENCOUNTER — Ambulatory Visit: Payer: BLUE CROSS/BLUE SHIELD | Admitting: Sports Medicine

## 2017-12-26 DIAGNOSIS — M5412 Radiculopathy, cervical region: Secondary | ICD-10-CM

## 2017-12-26 DIAGNOSIS — M19042 Primary osteoarthritis, left hand: Secondary | ICD-10-CM | POA: Diagnosis not present

## 2017-12-26 DIAGNOSIS — M19041 Primary osteoarthritis, right hand: Secondary | ICD-10-CM | POA: Diagnosis not present

## 2017-12-26 MED ORDER — PREDNISONE 50 MG PO TABS: ORAL_TABLET | ORAL | 0 refills | Status: DC

## 2017-12-26 NOTE — Assessment & Plan Note (Signed)
5 days of prednisone, she will go back to her traditional dosing of gabapentin. Home rehab exercises given for cervical spondylosis and lumbar DDD. Return in 1 month if no better, MRI for interventional planning at that time.

## 2017-12-26 NOTE — Progress Notes (Signed)
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 Pt reports that pain was 8/10 on the pain scale, severe, not persistent.  In office today she endorses a 2/10 -moderate-, but persistent pain that is non radiating in quality.  Pt reports that her current regimen of gabapentin is  one  300mg  tablet of gabapentin a day to manage symptoms but has discontinued meloxicam because it caused gastritis.  Pt reports she  reduced her gabapentin in attempt to reduce her current pharmacotherapy load.  Pt reports symptoms of back pain improve with standing upright, but she endorses having bad posture and not being able to maintain a straight spine.  Pt conveyed that she would be interested in home rehab exercises and completing formal PT was not fiscally ideal because of the charge affiliated with each visit.     Pt also reports that 6 weeks ago while closing the lid on a particularly large container she experienced a sharp tearing sensation (pain with flexion motion).  Pt reports that the sharp tearing sensation in her right hand does not radiate.  Pt reports that pain is not persistent and is only exacerbated when she "does stupid with stuff with her hand" eg. working to open a child-proof cap of medication or digging through her purse  (fine finger flexion motions).  Pt reports accompanying weakness and the inability to grasp items when hand pain is exacerbated.   I reviewed the past medical history, family history, social history, surgical history, and allergies today and no changes were needed.  Please see  the problem list section below in epic for further details.  Past Medical History: Past Medical History:  Diagnosis Date  . Bipolar 2 disorder (HCC)    pscyh hospitaliztion 2006  . DDD (degenerative disc disease), lumbar    uses tens unit  . Hernia    abdominal incisional  . Herniated disc   . Hypercholesteremia   . Hyperlipidemia   . Hypertension   . Impaired fasting glucose   . Obesity   . Stress incontinence, female    Past Surgical History: Past Surgical History:  Procedure Laterality Date  . ABDOMINAL HYSTERECTOMY    . BLADDER REPAIR    . BLADDER REPAIR    . CHOLECYSTECTOMY    . LAPAROSCOPIC GASTRIC SLEEVE RESECTION N/A 07/02/2017   Procedure: LAPAROSCOPIC GASTRIC SLEEVE RESECTION, UPPER ENDOSCOPY;  Surgeon: Sheliah HatchKinsinger, De BlanchLuke Aaron, MD;  Location: [facility];  Service: General;  Laterality: N/A;  . THYROIDECTOMY    . THYROIDECTOMY    . TOTAL ABDOMINAL HYSTERECTOMY W/ BILATERAL SALPINGOOPHORECTOMY  2001   for ovarian cyst   Social History: Social History   Socioeconomic History  . Marital status: Divorced    Spouse name: Not on file  . Number of children: Not on file  . Years of education: Not on file  . Highest education level: Not on file  Occupational History  . Not on file  Social Needs  . Financial resource strain: Not on file  . Food insecurity:    Worry: Not on  file    Inability: Not on file  . Transportation needs:    Medical: Not on file    Non-medical: Not on file  Tobacco Use  . Smoking status: Former Smoker    Last attempt to quit: 07/24/1987    Years since quitting: 30.4  . Smokeless tobacco: Never Used  Substance and Sexual Activity  . Alcohol use: No    Frequency: Never    Comment: very rarely  . Drug use: No  . Sexual activity: Yes    Birth control/protection: Surgical  Lifestyle  . Physical activity:    Days per week: Not on file    Minutes per session: Not on file  . Stress: Not on file  Relationships  . Social connections:    Talks  on phone: Not on file    Gets together: Not on file    Attends religious service: Not on file    Active member of club or organization: Not on file    Attends meetings of clubs or organizations: Not on file    Relationship status: Not on file  Other Topics Concern  . Not on file  Social History Narrative  . Not on file   Family History: Family History  Problem Relation Age of Onset  . Other Father        AMI  . Hyperlipidemia Father   . Hypertension Father   . Heart attack Father   . Cancer Other 80       breast  . Cancer Mother 39       breast  . Depression Sister    Allergies: Allergies  Allergen Reactions  . Erythromycin Diarrhea and Nausea And Vomiting    REACTION: GI Upset, has taken Zpak with no side effects  . Grapefruit Diet Or [Extra Strength Grapefruit]   . Miconazole Nitrate     burning  . Mobic [Meloxicam]     GI issues.   . Other Nausea And Vomiting    Nuts, mushrooms, pineapples, tomatoes, coconut   . Pravastatin Nausea Only   Medications: See med rec.  Review of Systems: No headache, visual changes, nausea, vomiting, diarrhea, constipation, dizziness, abdominal pain, skin rash, fevers, chills, night sweats, weight loss, swollen lymph nodes, body aches, joint swelling, muscle aches, chest pain, shortness of breath, mood changes, visual or auditory hallucinations.   Objective:   General: Well Developed, well nourished, and in no acute distress.  Neuro:  Extra-ocular muscles intact, able to move all 4 extremities, sensation grossly intact.  Deep tendon reflexes tested were normal. Psych: Alert and oriented, mood congruent with affect. ENT:  Ears and nose appear unremarkable.  Hearing grossly normal. Neck: Unremarkable overall appearance, trachea midline.  No visible thyroid enlargement. Eyes: Conjunctivae and lids appear unremarkable.  Pupils equal and round. Skin: Warm and dry, no rashes noted.  Cardiovascular: Pulses palpable, no extremity  edema.  Neck: Inspection unremarkable. No palpable stepoffs. Full neck range of motion Grip strength and sensation normal in bilateral hands Strength good C4 to T1 distribution No sensory change to C4 to T1, but some tenderness to palpation parascapular and in the thoracic and lumbar portion of her left spine.      Right Wrist: Inspection normal with no visible erythema or swelling. ROM smooth and normal with good flexion and extension and ulnar/radial deviation that is symmetrical with opposite wrist. Palpation is normal over 1st, 2nd, 4th, and 5th metacarpals, navicular, lunate, and TFCC; tendons without tenderness/ swelling; Tender to palpation at the  base of 3rd metacarpal; and pain with hyperextension of the digit.  No snuffbox tenderness. No tenderness over Canal of [place]. Strength 5/5 in all directions without pain.     Impression and Recommendations:   This case required medical decision making of moderate complexity.  Assessment- Right 3rd MCP Sprain Pt will be managed expectantly. Pt advised to follow up for evaluation of pain if symptoms do not improve in 1 month   Assessment- L. Cervical Radiculopathy Pt was prescribed a short course of prednisone  In order to diminish the pathophysiological impetus of her pain generator and to facilitate home rehab exercises.  Pt was encouraged to  complete home rehab exercises nightly to decrease pain long term. Pt was asked to return to her previous 300mg  qtd regimen of gabapentin for pain management.   Pt was counseled about next steps which include but are not limited to injection the cervical and/or lumbar spine,  further imaging (Xray and/or MR of the cervical and lumbar spine) should symptoms not alleviate. Pt was asked to follow up in one month for evaluation of pain should symptoms not improve    ___________________________________________ Ihor Austin. Benjamin Stain, M.D., ABFM., CAQSM. Primary Care and Sports Medicine [facility]  Adjunct Instructor of Family Medicine  [employer]

## 2017-12-26 NOTE — Assessment & Plan Note (Signed)
I think she has a right third MCP sprain. No aggressive intervention needed, we are to watch this for now.

## 2017-12-31 ENCOUNTER — Ambulatory Visit: Payer: Self-pay

## 2017-12-31 ENCOUNTER — Ambulatory Visit: Payer: Self-pay | Admitting: Skilled Nursing Facility1

## 2018-01-15 ENCOUNTER — Ambulatory Visit: Payer: Self-pay | Admitting: Skilled Nursing Facility1

## 2018-02-06 ENCOUNTER — Telehealth: Payer: Self-pay

## 2018-02-06 DIAGNOSIS — M549 Dorsalgia, unspecified: Secondary | ICD-10-CM

## 2018-02-06 DIAGNOSIS — N62 Hypertrophy of breast: Secondary | ICD-10-CM

## 2018-02-06 NOTE — Telephone Encounter (Signed)
Patient states she needs to go to physical therapy for 6 weeks for back pain due to large breasts before she can look at having surgery. The PT dept told her she needs to have a referral to come to them.

## 2018-02-06 NOTE — Telephone Encounter (Signed)
Placed referral  

## 2018-02-06 NOTE — Telephone Encounter (Signed)
Routing to PCP for order.

## 2018-02-10 ENCOUNTER — Ambulatory Visit (INDEPENDENT_AMBULATORY_CARE_PROVIDER_SITE_OTHER): Payer: BLUE CROSS/BLUE SHIELD | Admitting: Sports Medicine

## 2018-02-10 ENCOUNTER — Other Ambulatory Visit: Payer: Self-pay | Admitting: Physician Assistant

## 2018-02-10 ENCOUNTER — Encounter: Payer: Self-pay | Admitting: Sports Medicine

## 2018-02-10 DIAGNOSIS — M19042 Primary osteoarthritis, left hand: Secondary | ICD-10-CM | POA: Diagnosis not present

## 2018-02-10 DIAGNOSIS — Z1231 Encounter for screening mammogram for malignant neoplasm of breast: Secondary | ICD-10-CM

## 2018-02-10 DIAGNOSIS — M19041 Primary osteoarthritis, right hand: Secondary | ICD-10-CM

## 2018-02-10 NOTE — Assessment & Plan Note (Signed)
Right third MCP injection as above, return as needed.

## 2018-02-10 NOTE — Progress Notes (Signed)
Subjective:    CC: Right hand pain  HPI: Nearly a month now, this pleasant 62 year old female continues to have pain that she localizes at her right third MCP, moderate, persistent, no radiation.  No mechanical symptoms.  Oral NSAIDs have not been effective.  I reviewed the past medical history, family history, social history, surgical history, and allergies today and no changes were needed.  Please see the problem list section below in epic for further details.  Past Medical History: Past Medical History:  Diagnosis Date  . Bipolar 2 disorder (HCC)    pscyh hospitaliztion 2006  . DDD (degenerative disc disease), lumbar    uses tens unit  . Hernia    abdominal incisional  . Herniated disc   . Hypercholesteremia   . Hyperlipidemia   . Hypertension   . Impaired fasting glucose   . Obesity   . Stress incontinence, female    Past Surgical History: Past Surgical History:  Procedure Laterality Date  . ABDOMINAL HYSTERECTOMY    . BLADDER REPAIR    . BLADDER REPAIR    . CHOLECYSTECTOMY    . LAPAROSCOPIC GASTRIC SLEEVE RESECTION N/A 07/02/2017   Procedure: LAPAROSCOPIC GASTRIC SLEEVE RESECTION, UPPER ENDOSCOPY;  Surgeon: Sheliah Hatch, De Blanch, MD;  Location: [facility];  Service: General;  Laterality: N/A;  . THYROIDECTOMY    . THYROIDECTOMY    . TOTAL ABDOMINAL HYSTERECTOMY W/ BILATERAL SALPINGOOPHORECTOMY  2001   for ovarian cyst   Social History: Social History   Socioeconomic History  . Marital status: Divorced    Spouse name: Not on file  . Number of children: Not on file  . Years of education: Not on file  . Highest education level: Not on file  Occupational History  . Not on file  Social Needs  . Financial resource strain: Not on file  . Food insecurity:    Worry: Not on file    Inability: Not on file  . Transportation needs:    Medical: Not on file    Non-medical: Not on file  Tobacco Use  . Smoking status: Former Smoker    Last attempt to quit: 07/24/1987   Years since quitting: 30.5  . Smokeless tobacco: Never Used  Substance and Sexual Activity  . Alcohol use: No    Frequency: Never    Comment: very rarely  . Drug use: No  . Sexual activity: Yes    Birth control/protection: Surgical  Lifestyle  . Physical activity:    Days per week: Not on file    Minutes per session: Not on file  . Stress: Not on file  Relationships  . Social connections:    Talks on phone: Not on file    Gets together: Not on file    Attends religious service: Not on file    Active member of club or organization: Not on file    Attends meetings of clubs or organizations: Not on file    Relationship status: Not on file  Other Topics Concern  . Not on file  Social History Narrative  . Not on file   Family History: Family History  Problem Relation Age of Onset  . Other Father        AMI  . Hyperlipidemia Father   . Hypertension Father   . Heart attack Father   . Cancer Other 80       breast  . Cancer Mother 66       breast  . Depression Sister    Allergies:  Allergies  Allergen Reactions  . Erythromycin Diarrhea and Nausea And Vomiting    REACTION: GI Upset, has taken Zpak with no side effects  . Grapefruit Diet Or [Extra Strength Grapefruit]   . Miconazole Nitrate     burning  . Mobic [Meloxicam]     GI issues.   . Other Nausea And Vomiting    Nuts, mushrooms, pineapples, tomatoes, coconut   . Pravastatin Nausea Only   Medications: See med rec.  Review of Systems: No fevers, chills, night sweats, weight loss, chest pain, or shortness of breath.   Objective:    General: Well Developed, well nourished, and in no acute distress.  Neuro: Alert and oriented x3, extra-ocular muscles intact, sensation grossly intact.  HEENT: Normocephalic, atraumatic, pupils equal round reactive to light, neck supple, no masses, no lymphadenopathy, thyroid nonpalpable.  Skin: Warm and dry, no rashes. Cardiac: Regular rate and rhythm, no murmurs rubs or gallops,  no lower extremity edema.  Respiratory: Clear to auscultation bilaterally. Not using accessory muscles, speaking in full sentences. Right hand: Tender to palpation over the right third MCP.  No visible or palpable triggering  Procedure: Real-time Ultrasound Guided Injection of right third MCP Device: GE Logiq E  Verbal informed consent obtained.  Time-out conducted.  Noted no overlying erythema, induration, or other signs of local infection.  Skin prepped in a sterile fashion.  Local anesthesia: Topical Ethyl chloride.  With sterile technique and under real time ultrasound guidance: 1/2 cc kenalog 40, 1/2 cc lidocaine injected easily Completed without difficulty  Pain immediately resolved suggesting accurate placement of the medication.  Advised to call if fevers/chills, erythema, induration, drainage, or persistent bleeding.  Images permanently stored and available for review in the ultrasound unit.  Impression: Technically successful ultrasound guided injection.  Impression and Recommendations:    Primary osteoarthritis of both hands Right third MCP injection as above, return as needed. ___________________________________________ Ihor Austinhomas J. Benjamin Stainhekkekandam, M.D., ABFM., CAQSM. Primary Care and Sports Medicine [facility]  Adjunct Instructor of Family Medicine  [employer]

## 2018-02-12 ENCOUNTER — Ambulatory Visit (INDEPENDENT_AMBULATORY_CARE_PROVIDER_SITE_OTHER): Payer: BLUE CROSS/BLUE SHIELD

## 2018-02-12 DIAGNOSIS — Z1231 Encounter for screening mammogram for malignant neoplasm of breast: Secondary | ICD-10-CM | POA: Diagnosis not present

## 2018-02-17 NOTE — Progress Notes (Signed)
Call pt: normal mammogram. Follow up in 1 year.

## 2018-03-09 IMAGING — MR MR ANKLE*L* W/O CM
5 series · 40 of 40 positions shown · non-contrast
Comparison: 03/01/2017 ankle radiographs

CLINICAL DATA: Left media ankle pain since [REDACTED] without known
injury or surgeries.

EXAM:
MRI OF THE LEFT ANKLE WITHOUT CONTRAST
TECHNIQUE: Multiplanar, multisequence MR imaging of the ankle was performed. No
intravenous contrast was administered.

[Series 3: PD fat-sat · axial · 3.0mm · 0.70mm/px · z∈[-101,+38]mm · 9 of 43 slices shown]
[im 1/43]
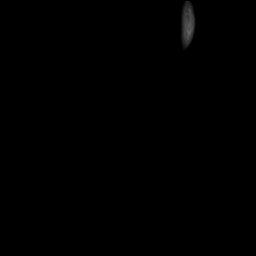
[im 6/43]
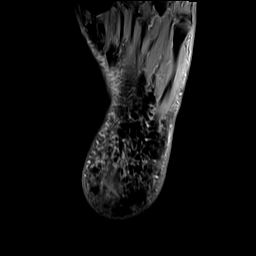
[im 11/43]
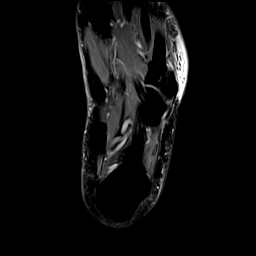
[im 16/43]
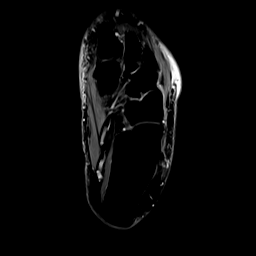
[im 22/43]
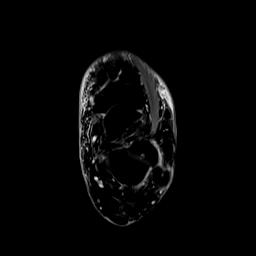
[im 27/43]
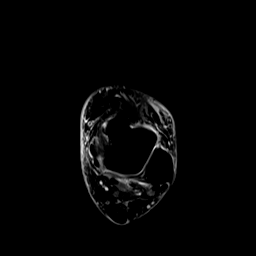
[im 32/43]
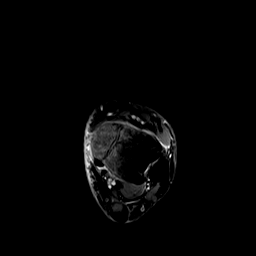
[im 37/43]
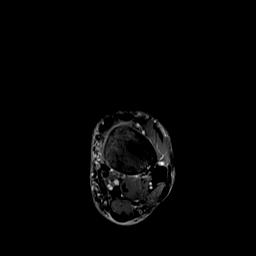
[im 43/43]
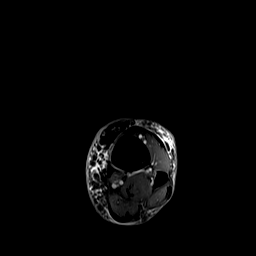

[Series 4: T2 fat-sat · axial · 3.0mm · 0.70mm/px · z∈[-101,+38]mm · 9 of 43 slices shown (1 of 3)]
[im 1/43]
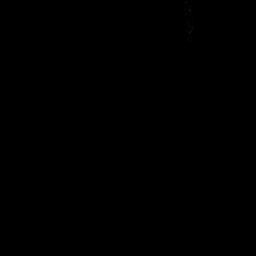
[im 6/43]
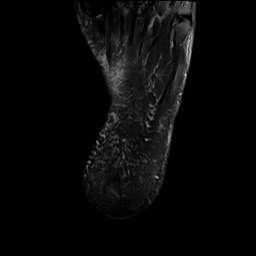
[im 11/43]
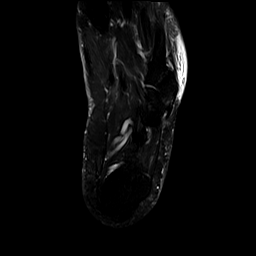
[im 16/43]
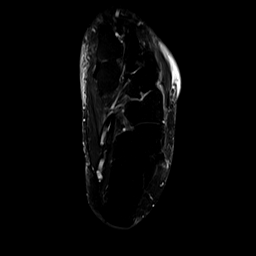
[im 22/43]
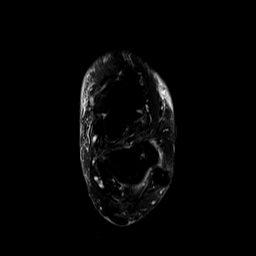
[im 27/43]
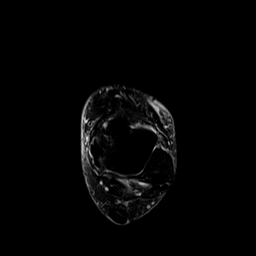
[im 32/43]
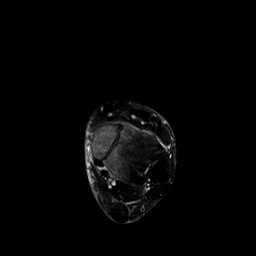
[im 37/43]
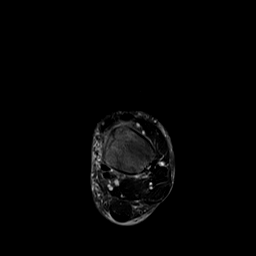
[im 43/43]
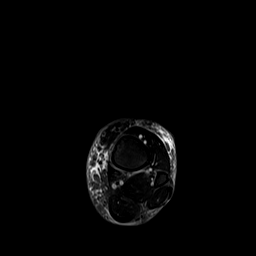

[Series 6: T1 · sagittal · 3.0mm · 0.56mm/px · 6 of 27 slices shown]
[im 1/27]
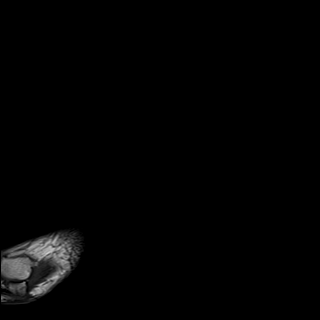
[im 6/27]
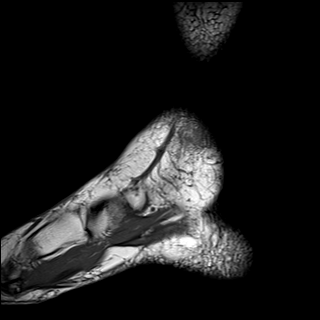
[im 11/27]
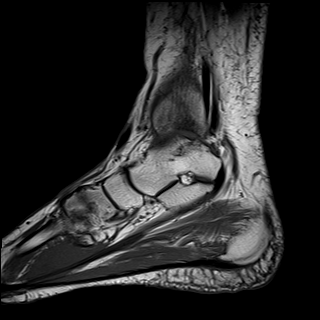
[im 16/27]
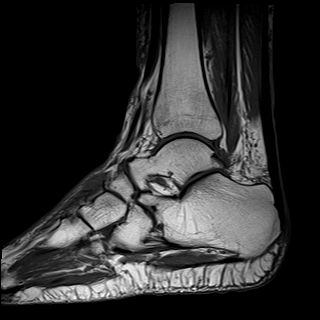
[im 21/27]
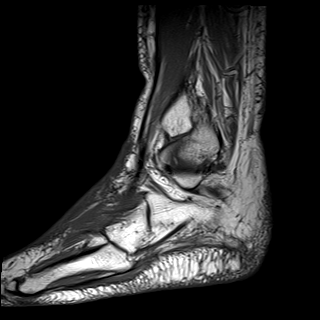
[im 27/27]
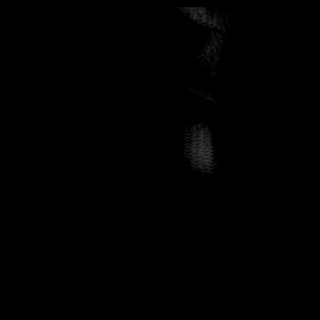

[Series 7: T2 fat-sat · sagittal · 3.0mm · 0.70mm/px · 6 of 27 slices shown (2 of 3)]
[im 1/27]
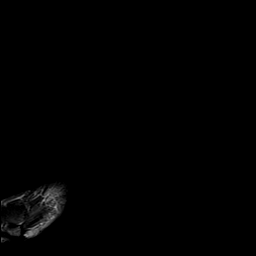
[im 6/27]
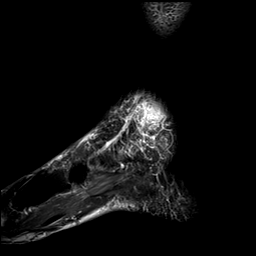
[im 11/27]
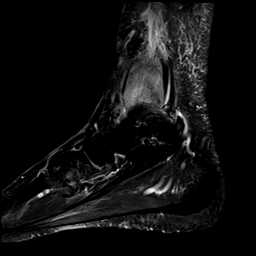
[im 16/27]
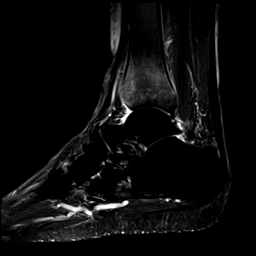
[im 21/27]
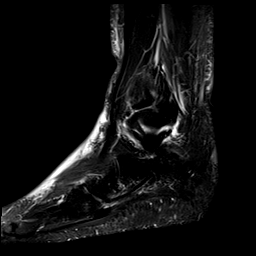
[im 27/27]
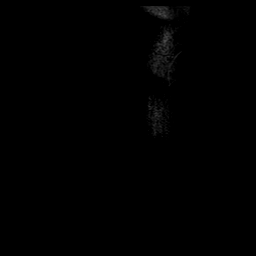

[Series 8: T2 fat-sat · coronal · 3.0mm · 0.70mm/px · 10 of 48 slices shown (3 of 3)]
[im 1/48]
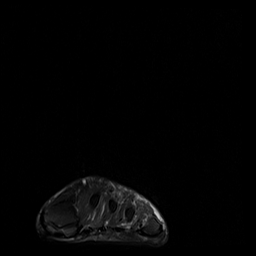
[im 6/48]
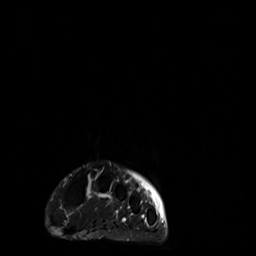
[im 11/48]
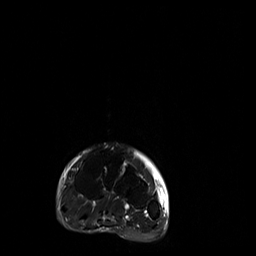
[im 16/48]
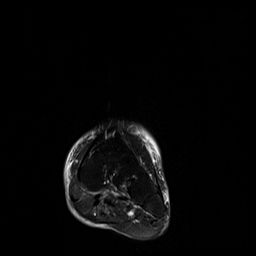
[im 21/48]
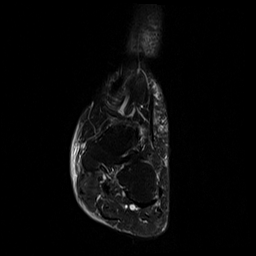
[im 27/48]
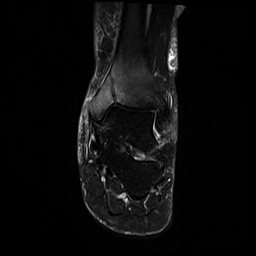
[im 32/48]
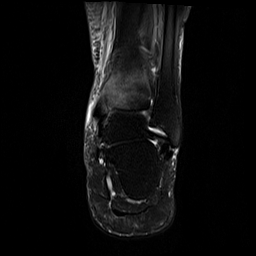
[im 37/48]
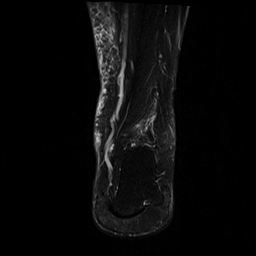
[im 42/48]
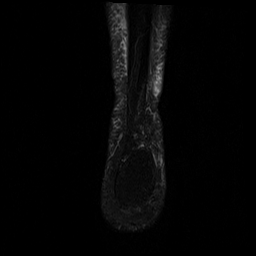
[im 48/48]
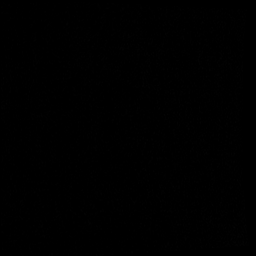

[40 of 40 positions shown; findings below may reference images not displayed]

FINDINGS: TENDONS

Peroneal: Intact peroneus longus and peroneus brevis tendons.

Posteromedial: Intact tibialis posterior, flexor hallucis longus and
flexor digitorum longus tendons.

Anterior: Intact tibialis anterior, extensor hallucis longus and
extensor digitorum longus tendons.

Achilles: Intact.

Plantar Fascia: Intact.

LIGAMENTS

Lateral: Intact.

Medial: Intact.

CARTILAGE

Ankle Joint: Chondral thinning over an osteochondral fracture of the
medial talar dome.

Subtalar Joints/Sinus Tarsi: Intact

Bones: There is a nondisplaced, sagittally oriented fracture of the
medial malleolus extending into the ankle joint with associated
moderate-to-marked reactive marrow edema. There is a 6 x 4 mm
osteochondral fracture of the medial talar dome with trace amount of
fluid undermining the osseous fragment.

Other: None
IMPRESSION: 1. Nondisplaced sagittally oriented fracture undermining the medial
malleolus of the tibia extending into the ankle joint without
widening of the medial clear space of the ankle mortise. This was
radiographically occult at time of imaging several weeks ago. These
results will be called to the ordering clinician or representative
by the Radiologist Assistant, and communication documented in the
PACS or zVision Dashboard.
2. Small 6 x 4 mm osteochondral fracture involving the medial talar
dome adjacent to site of tibial fracture with trace fluid
undermining the osteochondral fragment consistent with an unstable
osteochondritis dissecans.

## 2018-03-21 ENCOUNTER — Encounter: Payer: Self-pay | Admitting: Physician Assistant

## 2018-03-21 ENCOUNTER — Ambulatory Visit: Payer: BLUE CROSS/BLUE SHIELD | Admitting: Physician Assistant

## 2018-03-21 VITALS — BP 136/70 | Wt 202.0 lb

## 2018-03-21 DIAGNOSIS — K5901 Slow transit constipation: Secondary | ICD-10-CM | POA: Diagnosis not present

## 2018-03-21 DIAGNOSIS — Z9884 Bariatric surgery status: Secondary | ICD-10-CM

## 2018-03-21 DIAGNOSIS — R7303 Prediabetes: Secondary | ICD-10-CM

## 2018-03-21 DIAGNOSIS — E782 Mixed hyperlipidemia: Secondary | ICD-10-CM

## 2018-03-21 DIAGNOSIS — G4733 Obstructive sleep apnea (adult) (pediatric): Secondary | ICD-10-CM

## 2018-03-21 DIAGNOSIS — Z6839 Body mass index (BMI) 39.0-39.9, adult: Secondary | ICD-10-CM

## 2018-03-21 DIAGNOSIS — I10 Essential (primary) hypertension: Secondary | ICD-10-CM

## 2018-03-21 DIAGNOSIS — I8392 Asymptomatic varicose veins of left lower extremity: Secondary | ICD-10-CM

## 2018-03-21 NOTE — Patient Instructions (Addendum)
Decrease cozzar to 50mg  daily. Follow up in 2 months. miralax for constipation.    Constipation, Adult Constipation is when a person:  Poops (has a bowel movement) fewer times in a week than normal.  Has a hard time pooping.  Has poop that is dry, hard, or bigger than normal.  Follow these instructions at home: Eating and drinking   Eat foods that have a lot of fiber, such as: ? Fresh fruits and vegetables. ? Whole grains. ? Beans.  Eat less of foods that are high in fat, low in fiber, or overly processed, such as: ? [place] fries. ? Hamburgers. ? Cookies. ? Candy. ? Soda.  Drink enough fluid to keep your pee (urine) clear or pale yellow. General instructions  Exercise regularly or as told by your doctor.  Go to the restroom when you feel like you need to poop. Do not hold it in.  Take over-the-counter and prescription medicines only as told by your doctor. These include any fiber supplements.  Do pelvic floor retraining exercises, such as: ? Doing deep breathing while relaxing your lower belly (abdomen). ? Relaxing your pelvic floor while pooping.  Watch your condition for any changes.  Keep all follow-up visits as told by your doctor. This is important. Contact a doctor if:  You have pain that gets worse.  You have a fever.  You have not pooped for 4 days.  You throw up (vomit).  You are not hungry.  You lose weight.  You are bleeding from the anus.  You have thin, pencil-like poop (stool). Get help right away if:  You have a fever, and your symptoms suddenly get worse.  You leak poop or have blood in your poop.  Your belly feels hard or bigger than normal (is bloated).  You have very bad belly pain.  You feel dizzy or you faint. This information is not intended to replace advice given to you by your health care provider. Make sure you discuss any questions you have with your health care provider. Document Released: 12/26/2007 Document  Revised: 01/27/2016 Document Reviewed: 12/28/2015 Elsevier Interactive Patient Education  2018 [employer]  Constipation, Adult Constipation is when a person:  Poops (has a bowel movement) fewer times in a week than normal.  Has a hard time pooping.  Has poop that is dry, hard, or bigger than normal.  Follow these instructions at home: Eating and drinking   Eat foods that have a lot of fiber, such as: ? Fresh fruits and vegetables. ? Whole grains. ? Beans.  Eat less of foods that are high in fat, low in fiber, or overly processed, such as: ? [place] fries. ? Hamburgers. ? Cookies. ? Candy. ? Soda.  Drink enough fluid to keep your pee (urine) clear or pale yellow. General instructions  Exercise regularly or as told by your doctor.  Go to the restroom when you feel like you need to poop. Do not hold it in.  Take over-the-counter and prescription medicines only as told by your doctor. These include any fiber supplements.  Do pelvic floor retraining exercises, such as: ? Doing deep breathing while relaxing your lower belly (abdomen). ? Relaxing your pelvic floor while pooping.  Watch your condition for any changes.  Keep all follow-up visits as told by your doctor. This is important. Contact a doctor if:  You have pain that gets worse.  You have a fever.  You have not pooped for 4 days.  You throw up (vomit).  You  are not hungry.  You lose weight.  You are bleeding from the anus.  You have thin, pencil-like poop (stool). Get help right away if:  You have a fever, and your symptoms suddenly get worse.  You leak poop or have blood in your poop.  Your belly feels hard or bigger than normal (is bloated).  You have very bad belly pain.  You feel dizzy or you faint. This information is not intended to replace advice given to you by your health care provider. Make sure you discuss any questions you have with your health care provider. Document Released:  12/26/2007 Document Revised: 01/27/2016 Document Reviewed: 12/28/2015 Elsevier Interactive Patient Education  2018 [employer].

## 2018-03-21 NOTE — Progress Notes (Signed)
Subjective:    Patient ID: Sharon Hoover, female    DOB: 23-Oct-1955, 62 y.o.   MRN: [id number]  HPI  Pt is a [age] obese female with HTN, OSA, pre-diabetes who presents to the clinic for follow up. She is 8 months s/p gastric sleeve.   HTN- her BP is dropping. She reports some dizziness. On losartan/HCTZ daily.   OSA-not using machine right now. Finding snoring and restful sleep is much better with weight loss. She continues to exercise regularly with walking. She continues to battle emotional eating but very committed to continue to lose weight.   She does question varicose vein over left knee. No pain, redness or swelling.   Hx of pre-diabetes. Not had labs since gastric sleeve procedure.   .. Active Ambulatory Problems    Diagnosis Date Noted  . THYROID MASS 07/08/2009  . BIPOLAR DISORDER UNSPECIFIED 07/08/2007  . ADJUSTMENT DISORDER WITH DEPRESSED MOOD 08/18/2008  . ESSENTIAL HYPERTENSION, BENIGN 07/08/2007  . ALLERGIC RHINITIS 03/22/2010  . BRONCHITIS, ALLERGIC 05/31/2010  . ASTHMA UNSPECIFIED WITH EXACERBATION 08/18/2008  . IBS 06/07/2008  . FEMALE STRESS INCONTINENCE 07/08/2007  . ACNE VULGARIS 06/07/2008  . DISC DISEASE, LUMBAR 07/08/2007  . CERVICALGIA 11/04/2009  . CHEST WALL PAIN, ACUTE 06/22/2008  . NECK SPASM 09/02/2008  . BACK STRAIN 11/04/2009  . DIZZINESS 08/18/2010  . CONTUSION, HEAD 08/18/2010  . NUMBNESS, ARM 09/27/2010  . Shoulder bursitis 11/15/2010  . Hip pain 11/30/2010  . Pre-diabetes 12/04/2013  . Insomnia 12/04/2013  . Fatigue 12/04/2013  . OSA (obstructive sleep apnea) 12/04/2013  . Trigger finger, acquired 12/31/2013  . Thoracic back pain 02/23/2015  . Left cervical radiculopathy 04/04/2015  . Primary osteoarthritis of both hands 04/04/2015  . Hyperlipidemia 10/24/2015  . Left talar osteochondral defect with tibial fracture 03/05/2017  . S/P laparoscopic sleeve gastrectomy 07/02/2017  . Class 2 severe obesity due to excess calories with  serious comorbidity and body mass index (BMI) of 39.0 to 39.9 in adult (HCC) 08/02/2017  . Skin lump of leg, right 11/19/2017  . Slow transit constipation 03/21/2018  . Asymptomatic varicose veins of left lower extremity 03/24/2018   Resolved Ambulatory Problems    Diagnosis Date Noted  . HYPERCHOLESTEROLEMIA 07/08/2007  . Acute bronchitis 01/02/2008  . CELLULITIS 11/09/2009  . TOE PAIN 12/22/2008  . Wheezing 09/07/2008  . FRACTURE, FINGER, DISTAL 07/08/2009  . FRACTURE, TOE 12/23/2008  . MUSCLE STRAIN, HAMSTRING MUSCLE 03/22/2010  . Obesity, Class III, BMI 40-49.9 (morbid obesity) (HCC) 11/15/2010  . Morbid obesity due to excess calories (HCC) 10/24/2015  . Abnormal weight gain 10/24/2015  . Closed displaced fracture of distal phalanx of right great toe 06/11/2016  . Displaced fracture of proximal phalanx of right great toe with delayed healing 07/02/2016  . Morbid obesity (HCC) 07/02/2017   Past Medical History:  Diagnosis Date  . Bipolar 2 disorder (HCC)   . DDD (degenerative disc disease), lumbar   . Hernia   . Herniated disc   . Hypercholesteremia   . Hypertension   . Impaired fasting glucose   . Obesity   . Stress incontinence, female      Review of Systems  All other systems reviewed and are negative.      Objective:   Physical Exam  Constitutional: She is oriented to person, place, and time. She appears well-developed and well-nourished.  HENT:  Head: Normocephalic and atraumatic.  Cardiovascular: Normal rate and regular rhythm.  Pulmonary/Chest: Effort normal and breath sounds normal.  Neurological: She  is alert and oriented to person, place, and time.  Skin:  Varicose vein visualized over left knee. Non tender, not warm or red.    Psychiatric: She has a normal mood and affect. Her behavior is normal.          Assessment & Plan:  Marland KitchenMarland KitchenDiagnoses and all orders for this visit:  Essential hypertension, benign  Slow transit constipation  OSA  (obstructive sleep apnea)  S/P laparoscopic sleeve gastrectomy -     VITAMIN D 25 Hydroxy (Vit-D Deficiency, Fractures) -     COMPLETE METABOLIC PANEL WITH GFR -     Lipid Panel w/reflex Direct LDL -     Hemoglobin A1c -     B12 and Folate Panel  Class 2 severe obesity due to excess calories with serious comorbidity and body mass index (BMI) of 39.0 to 39.9 in adult [facility])  Mixed hyperlipidemia  Pre-diabetes -     COMPLETE METABOLIC PANEL WITH GFR -     Hemoglobin A1c  Asymptomatic varicose veins of left lower extremity  fasting labs ordered.   BP low side. Decrease cozaar to 50mg . Follow up in 2 months.  Discussed miralax for constipation. Discussed diet and gave HO.   Reassured about varicose vein on left knee cap.   Continue to work on diet and exercise. Doing well but has plateau around 200. We could retest her for CPAP;however, she seems to be sleeping well. Will hold off for now due to cost. Labs ordered to evaluate vitamins after gastric sleeve.

## 2018-03-24 DIAGNOSIS — I8392 Asymptomatic varicose veins of left lower extremity: Secondary | ICD-10-CM | POA: Insufficient documentation

## 2018-03-27 LAB — COMPLETE METABOLIC PANEL WITH GFR
AG Ratio: 2 (calc) (ref 1.0–2.5)
ALBUMIN MSPROF: 4.1 g/dL (ref 3.6–5.1)
ALKALINE PHOSPHATASE (APISO): 89 U/L (ref 33–130)
ALT: 16 U/L (ref 6–29)
AST: 11 U/L (ref 10–35)
BUN: 13 mg/dL (ref 7–25)
CO2: 30 mmol/L (ref 20–32)
CREATININE: 0.83 mg/dL (ref 0.50–0.99)
Calcium: 9.4 mg/dL (ref 8.6–10.4)
Chloride: 104 mmol/L (ref 98–110)
GFR, EST NON AFRICAN AMERICAN: 77 mL/min/{1.73_m2} (ref >=60)
GFR, Est African American: 89 mL/min/{1.73_m2} (ref >=60)
Globulin: 2.1 g/dL (calc) (ref 1.9–3.7)
Glucose, Bld: 93 mg/dL (ref 65–99)
Potassium: 4.1 mmol/L (ref 3.5–5.3)
SODIUM: 142 mmol/L (ref 135–146)
Total Bilirubin: 0.5 mg/dL (ref 0.2–1.2)
Total Protein: 6.2 g/dL (ref 6.1–8.1)

## 2018-03-27 LAB — LIPID PANEL W/REFLEX DIRECT LDL
CHOL/HDL RATIO: 2.8 (calc) (ref <5.0)
Cholesterol: 169 mg/dL (ref <200)
HDL: 60 mg/dL (ref >50)
LDL Cholesterol (Calc): 92 mg/dL (calc)
NON-HDL CHOLESTEROL (CALC): 109 mg/dL (ref <130)
Triglycerides: 80 mg/dL (ref <150)

## 2018-03-27 LAB — HEMOGLOBIN A1C
EAG (MMOL/L): 6 (calc)
HEMOGLOBIN A1C: 5.4 %{Hb} (ref <5.7)
Mean Plasma Glucose: 108 (calc)

## 2018-03-27 LAB — B12 AND FOLATE PANEL
Folate: 18.1 ng/mL
Vitamin B-12: 325 pg/mL (ref 200–1100)

## 2018-03-27 LAB — VITAMIN D 25 HYDROXY (VIT D DEFICIENCY, FRACTURES): VIT D 25 HYDROXY: 31 ng/mL (ref 30–100)

## 2018-03-27 NOTE — Progress Notes (Signed)
Call pt: vitamin D is just barely in normal range. Need to be on at least 2000units daily of d3.  Cholesterol looks fantastic.  a1c is great.  b12 in normal range but a tad on the low side. I would start daily.

## 2018-04-02 ENCOUNTER — Other Ambulatory Visit: Payer: Self-pay | Admitting: Physician Assistant

## 2018-04-02 DIAGNOSIS — M5412 Radiculopathy, cervical region: Secondary | ICD-10-CM

## 2018-04-21 ENCOUNTER — Encounter: Payer: Self-pay | Admitting: Sports Medicine

## 2018-04-21 ENCOUNTER — Ambulatory Visit (INDEPENDENT_AMBULATORY_CARE_PROVIDER_SITE_OTHER): Payer: BLUE CROSS/BLUE SHIELD | Admitting: Sports Medicine

## 2018-04-21 DIAGNOSIS — M19042 Primary osteoarthritis, left hand: Secondary | ICD-10-CM

## 2018-04-21 DIAGNOSIS — M119 Crystal arthropathy, unspecified: Secondary | ICD-10-CM

## 2018-04-21 DIAGNOSIS — M19041 Primary osteoarthritis, right hand: Secondary | ICD-10-CM | POA: Diagnosis not present

## 2018-04-21 LAB — SYNOVIAL FLUID, CRYSTAL

## 2018-04-21 NOTE — Assessment & Plan Note (Addendum)
Left fourth MCP irrigation, aspiration, followed by injection. Crystal analysis. We need to ensure whether this is pseudogout or gout versus simple osteoarthritis. If crystal analysis is negative we will need to do a rheumatoid work-up.  There were crystals present in the joint fluid lavage, the lab was unable to identify the crystals so we do not yet know if this is gout or pseudogout.  I am going to add serum uric acid levels as well as a rheumatoid work-up to be checked in the lab, and if this recurs we are going to add colchicine to the regimen.  Differential includes gout and pseudogout.

## 2018-04-21 NOTE — Progress Notes (Addendum)
Subjective:    CC: Left hand pain  HPI: This is a pleasant 62 year old female with intermittent hand swelling and pain, diagnosed as osteoarthritis, she gets occasional injections.  More recently she has had several days of increasing swelling in her left fourth MCP, moderate, persistent.  No radiation, no trauma.  No constitutional symptoms.  Oral analgesics are not effective.  I reviewed the past medical history, family history, social history, surgical history, and allergies today and no changes were needed.  Please see the problem list section below in epic for further details.  Past Medical History: Past Medical History:  Diagnosis Date  . Bipolar 2 disorder (HCC)    pscyh hospitaliztion 2006  . DDD (degenerative disc disease), lumbar    uses tens unit  . Hernia    abdominal incisional  . Herniated disc   . Hypercholesteremia   . Hyperlipidemia   . Hypertension   . Impaired fasting glucose   . Obesity   . Stress incontinence, female    Past Surgical History: Past Surgical History:  Procedure Laterality Date  . ABDOMINAL HYSTERECTOMY    . BLADDER REPAIR    . BLADDER REPAIR    . CHOLECYSTECTOMY    . LAPAROSCOPIC GASTRIC SLEEVE RESECTION N/A 07/02/2017   Procedure: LAPAROSCOPIC GASTRIC SLEEVE RESECTION, UPPER ENDOSCOPY;  Surgeon: Sheliah Hatch, De Blanch, MD;  Location: [facility];  Service: General;  Laterality: N/A;  . THYROIDECTOMY    . THYROIDECTOMY    . TOTAL ABDOMINAL HYSTERECTOMY W/ BILATERAL SALPINGOOPHORECTOMY  2001   for ovarian cyst   Social History: Social History   Socioeconomic History  . Marital status: Divorced    Spouse name: Not on file  . Number of children: Not on file  . Years of education: Not on file  . Highest education level: Not on file  Occupational History  . Not on file  Social Needs  . Financial resource strain: Not on file  . Food insecurity:    Worry: Not on file    Inability: Not on file  . Transportation needs:    Medical: Not on  file    Non-medical: Not on file  Tobacco Use  . Smoking status: Former Smoker    Last attempt to quit: 07/24/1987    Years since quitting: 30.7  . Smokeless tobacco: Never Used  Substance and Sexual Activity  . Alcohol use: No    Frequency: Never    Comment: very rarely  . Drug use: No  . Sexual activity: Yes    Birth control/protection: Surgical  Lifestyle  . Physical activity:    Days per week: Not on file    Minutes per session: Not on file  . Stress: Not on file  Relationships  . Social connections:    Talks on phone: Not on file    Gets together: Not on file    Attends religious service: Not on file    Active member of club or organization: Not on file    Attends meetings of clubs or organizations: Not on file    Relationship status: Not on file  Other Topics Concern  . Not on file  Social History Narrative  . Not on file   Family History: Family History  Problem Relation Age of Onset  . Other Father        AMI  . Hyperlipidemia Father   . Hypertension Father   . Heart attack Father   . Cancer Other 80       breast  .  Cancer Mother 79       breast  . Depression Sister    Allergies: Allergies  Allergen Reactions  . Erythromycin Diarrhea and Nausea And Vomiting    REACTION: GI Upset, has taken Zpak with no side effects  . Grapefruit Diet Or [Extra Strength Grapefruit]   . Miconazole Nitrate     burning  . Mobic [Meloxicam]     GI issues.   . Other Nausea And Vomiting    Nuts, mushrooms, pineapples, tomatoes, coconut   . Pravastatin Nausea Only   Medications: See med rec.  Review of Systems: No fevers, chills, night sweats, weight loss, chest pain, or shortness of breath.   Objective:    General: Well Developed, well nourished, and in no acute distress.  Neuro: Alert and oriented x3, extra-ocular muscles intact, sensation grossly intact.  HEENT: Normocephalic, atraumatic, pupils equal round reactive to light, neck supple, no masses, no  lymphadenopathy, thyroid nonpalpable.  Skin: Warm and dry, no rashes. Cardiac: Regular rate and rhythm, no murmurs rubs or gallops, no lower extremity edema.  Respiratory: Clear to auscultation bilaterally. Not using accessory muscles, speaking in full sentences. Left hand: Visibly swollen fourth MCP with some erythema.  Tender to palpation with palpable synovitis.  Procedure: Real-time Ultrasound Guided irrigation/aspiration and injection of left fourth MCP Device: GE Logiq E  Verbal informed consent obtained.  Time-out conducted.  Noted no overlying erythema, induration, or other signs of local infection.  Skin prepped in a sterile fashion.  Local anesthesia: Topical Ethyl chloride.  With sterile technique and under real time ultrasound guidance: I advanced into the MCP with a 25-gauge needle, injected 0.5 cc of sterile saline, approximately a cc of sterile saline was aspirated out, syringe switched and 1/2 cc kenalog 40, 1/2 cc lidocaine injected easily. Completed without difficulty  Pain immediately resolved suggesting accurate placement of the medication.  Advised to call if fevers/chills, erythema, induration, drainage, or persistent bleeding.  Images permanently stored and available for review in the ultrasound unit.  Impression: Technically successful ultrasound guided injection.  Impression and Recommendations:    Arthropathy, crystal, hand Left fourth MCP irrigation, aspiration, followed by injection. Crystal analysis. We need to ensure whether this is pseudogout or gout versus simple osteoarthritis. If crystal analysis is negative we will need to do a rheumatoid work-up.  There were crystals present in the joint fluid lavage, the lab was unable to identify the crystals so we do not yet know if this is gout or pseudogout.  I am going to add serum uric acid levels as well as a rheumatoid work-up to be checked in the lab, and if this recurs we are going to add colchicine to the  regimen.  Differential includes gout and pseudogout. ___________________________________________ Ihor Austin. Benjamin Stain, M.D., ABFM., CAQSM. Primary Care and Sports Medicine [facility]  Adjunct Instructor of Family Medicine  [employer]

## 2018-04-22 NOTE — Addendum Note (Signed)
Addended by: Monica Becton on: 04/22/2018 09:44 AM   Modules accepted: Orders

## 2018-04-25 ENCOUNTER — Ambulatory Visit: Payer: Self-pay | Admitting: Sports Medicine

## 2018-05-31 ENCOUNTER — Other Ambulatory Visit: Payer: Self-pay

## 2018-05-31 ENCOUNTER — Emergency Department (INDEPENDENT_AMBULATORY_CARE_PROVIDER_SITE_OTHER)
Admission: EM | Admit: 2018-05-31 | Discharge: 2018-05-31 | Disposition: A | Discharge status: Home / Self Care | Payer: BLUE CROSS/BLUE SHIELD | Source: Home / Self Care | Attending: Family Medicine | Admitting: Family Medicine

## 2018-05-31 DIAGNOSIS — R319 Hematuria, unspecified: Secondary | ICD-10-CM

## 2018-05-31 DIAGNOSIS — N390 Urinary tract infection, site not specified: Secondary | ICD-10-CM

## 2018-05-31 LAB — POCT CBC W AUTO DIFF (K'VILLE URGENT CARE)

## 2018-05-31 LAB — POCT URINALYSIS DIP (MANUAL ENTRY)
BILIRUBIN UA: NEGATIVE
GLUCOSE UA: NEGATIVE mg/dL
Ketones, POC UA: NEGATIVE mg/dL
Nitrite, UA: POSITIVE — AB
Protein Ur, POC: >=300 mg/dL — AB
SPEC GRAV UA: 1.025 (ref 1.010–1.025)
UROBILINOGEN UA: 0.2 U/dL
pH, UA: 6 (ref 5.0–8.0)

## 2018-05-31 MED ORDER — CEPHALEXIN 500 MG PO CAPS: 500.0000 mg | ORAL_CAPSULE | Freq: Two times a day (BID) | ORAL | 0 refills | Status: DC

## 2018-05-31 NOTE — ED Triage Notes (Signed)
Pt c/o UTI sxs x 5 days. Urinary frequency with burning and urine seems dark in color.

## 2018-05-31 NOTE — Discharge Instructions (Addendum)
Increase fluid intake. May use non-prescription AZO for about two days, if desired, to decrease urinary discomfort.  If symptoms become significantly worse during the night or over the weekend, proceed to the local emergency room.  

## 2018-05-31 NOTE — ED Provider Notes (Signed)
[facility]    CSN: [id number] Arrival date & time: 05/31/18  6578     History   Chief Complaint Chief Complaint  Patient presents with  . Urinary Tract Infection    HPI Sharon Hoover is a 62 y.o. female.   Patient complains of five day history of urinary frequency, urgency, hesitancy, and dysuria.  She has felt fatigued and her urine has been darker than usual.  She also feels cold but denies fever.  She has developed right flank discomfort.  The history is provided by the patient.  Dysuria  Pain quality:  Burning Pain severity:  Mild Onset quality:  Gradual Duration:  5 days Timing:  Constant Progression:  Worsening Chronicity:  New Recent urinary tract infections: no   Relieved by:  None tried Worsened by:  Nothing Ineffective treatments:  None tried Urinary symptoms: discolored urine, frequent urination and hesitancy   Urinary symptoms: no foul-smelling urine, no hematuria and no bladder incontinence   Associated symptoms: flank pain   Associated symptoms: no abdominal pain, no fever, no nausea, no vaginal discharge and no vomiting     Past Medical History:  Diagnosis Date  . Bipolar 2 disorder (HCC)    pscyh hospitaliztion 2006  . DDD (degenerative disc disease), lumbar    uses tens unit  . Hernia    abdominal incisional  . Herniated disc   . Hypercholesteremia   . Hyperlipidemia   . Hypertension   . Impaired fasting glucose   . Obesity   . Stress incontinence, female     Patient Active Problem List   Diagnosis Date Noted  . Asymptomatic varicose veins of left lower extremity 03/24/2018  . Slow transit constipation 03/21/2018  . Skin lump of leg, right 11/19/2017  . Class 2 severe obesity due to excess calories with serious comorbidity and body mass index (BMI) of 39.0 to 39.9 in adult (HCC) 08/02/2017  . S/P laparoscopic sleeve gastrectomy 07/02/2017  . Left talar osteochondral defect with tibial fracture 03/05/2017  .  Hyperlipidemia 10/24/2015  . Left cervical radiculopathy 04/04/2015  . Arthropathy, crystal, hand 04/04/2015  . Thoracic back pain 02/23/2015  . Trigger finger, acquired 12/31/2013  . Pre-diabetes 12/04/2013  . Insomnia 12/04/2013  . Fatigue 12/04/2013  . OSA (obstructive sleep apnea) 12/04/2013  . Hip pain 11/30/2010  . Shoulder bursitis 11/15/2010  . NUMBNESS, ARM 09/27/2010  . DIZZINESS 08/18/2010  . CONTUSION, HEAD 08/18/2010  . BRONCHITIS, ALLERGIC 05/31/2010  . ALLERGIC RHINITIS 03/22/2010  . CERVICALGIA 11/04/2009  . BACK STRAIN 11/04/2009  . THYROID MASS 07/08/2009  . NECK SPASM 09/02/2008  . ADJUSTMENT DISORDER WITH DEPRESSED MOOD 08/18/2008  . ASTHMA UNSPECIFIED WITH EXACERBATION 08/18/2008  . CHEST WALL PAIN, ACUTE 06/22/2008  . IBS 06/07/2008  . ACNE VULGARIS 06/07/2008  . BIPOLAR DISORDER UNSPECIFIED 07/08/2007  . ESSENTIAL HYPERTENSION, BENIGN 07/08/2007  . FEMALE STRESS INCONTINENCE 07/08/2007  . DISC DISEASE, LUMBAR 07/08/2007    Past Surgical History:  Procedure Laterality Date  . ABDOMINAL HYSTERECTOMY    . BLADDER REPAIR    . BLADDER REPAIR    . CHOLECYSTECTOMY    . LAPAROSCOPIC GASTRIC SLEEVE RESECTION N/A 07/02/2017   Procedure: LAPAROSCOPIC GASTRIC SLEEVE RESECTION, UPPER ENDOSCOPY;  Surgeon: Sheliah Hatch, De Blanch, MD;  Location: [facility];  Service: General;  Laterality: N/A;  . THYROIDECTOMY    . THYROIDECTOMY    . TOTAL ABDOMINAL HYSTERECTOMY W/ BILATERAL SALPINGOOPHORECTOMY  2001   for ovarian cyst    OB History  None      Home Medications    Prior to Admission medications   Medication Sig Start Date End Date Taking? Authorizing Provider  acetaminophen (TYLENOL) 500 MG tablet Take 1,000 mg by mouth daily as needed for moderate pain.    [provider]  acetaminophen-codeine (TYLENOL #3) 300-30 MG tablet take 1 tablet by mouth every 6 hours if needed for pain 11/19/17   Breeback, Jade L, PA-C  buPROPion (WELLBUTRIN XL) 300 MG 24  hr tablet Take 1 tablet (300 mg total) by mouth daily. Appointment required for future refills. Patient taking differently: Take 150 mg by mouth daily. Appointment required for future refills. 01/19/14   Breeback, Jade L, PA-C  cephALEXin (KEFLEX) 500 MG capsule Take 1 capsule (500 mg total) by mouth 2 (two) times daily. 05/31/18   Lattie Haw, MD  Diclofenac Sodium 2 % SOLN Apply twice a day. 12/05/17   Breeback, Lesly Rubenstein L, PA-C  econazole nitrate 1 % cream apply to affected area once daily Patient taking differently: Apply 1 application topically daily as needed (yeast infection).  06/22/16   Breeback, Jade L, PA-C  gabapentin (NEURONTIN) 100 MG capsule Take 1 capsule (100 mg total) by mouth 2 (two) times daily. 04/04/18   Breeback, Lonna Cobb, PA-C  hydrochlorothiazide (MICROZIDE) 12.5 MG capsule Take 12.5 mg by mouth daily. As needed for lower extremity edema.    [provider]  lamoTRIgine (LAMICTAL) 100 MG tablet Take 2 tablets (200 mg total) by mouth at bedtime. 05/14/16   Breeback, Jade L, PA-C  losartan (COZAAR) 50 MG tablet Take 50 mg by mouth daily.    [provider]    Family History Family History  Problem Relation Age of Onset  . Other Father        AMI  . Hyperlipidemia Father   . Hypertension Father   . Heart attack Father   . Cancer Other 16       breast  . Cancer Mother 64       breast  . Depression Sister     Social History Social History   Tobacco Use  . Smoking status: Former Smoker    Last attempt to quit: 07/24/1987    Years since quitting: 30.8  . Smokeless tobacco: Never Used  Substance Use Topics  . Alcohol use: No    Frequency: Never    Comment: very rarely  . Drug use: No     Allergies   Erythromycin; Grapefruit diet or [extra strength grapefruit]; Miconazole nitrate; Mobic [meloxicam]; Other; and Pravastatin   Review of Systems Review of Systems  Constitutional: Negative for fever.  Gastrointestinal: Negative for abdominal  pain, nausea and vomiting.  Genitourinary: Positive for dysuria and flank pain. Negative for vaginal discharge.  All other systems reviewed and are negative.    Physical Exam Triage Vital Signs ED Triage Vitals [05/31/18 0945]  Enc Vitals Group     BP (!) 155/81     Pulse Rate (!) 56     Resp 18     Temp (!) 97.4 F (36.3 C)     Temp Source Oral     SpO2 97 %     Weight 199 lb (90.3 kg)     Height 5\' 4"  (1.626 m)     Head Circumference      Peak Flow      Pain Score 0     Pain Loc      Pain Edu?  Excl. in GC?    No data found.  Updated Vital Signs BP (!) 155/81 (BP Location: Right Arm)   Pulse (!) 56   Temp (!) 97.4 F (36.3 C) (Oral)   Resp 18   Ht 5\' 4"  (1.626 m)   Wt 90.3 kg   SpO2 97%   BMI 34.16 kg/m   Visual Acuity Right Eye Distance:   Left Eye Distance:   Bilateral Distance:    Right Eye Near:   Left Eye Near:    Bilateral Near:     Physical Exam Nursing notes and Vital Signs reviewed. Appearance:  Patient appears stated age, and in no acute distress.    Eyes:  Pupils are equal, round, and reactive to light and accomodation.  Extraocular movement is intact.  Conjunctivae are not inflamed   Pharynx:  Normal; moist mucous membranes  Neck:  Supple.  No adenopathy Lungs:  Clear to auscultation.  Breath sounds are equal.  Moving air well. Heart:  Regular rate and rhythm without murmurs, rubs, or gallops.  Abdomen:  Nontender without masses or hepatosplenomegaly.  Bowel sounds are present.  Right flank tenderness is present Extremities:  No edema.  Skin:  No rash present.     UC Treatments / Results  Labs (all labs ordered are listed, but only abnormal results are displayed) Labs Reviewed  POCT URINALYSIS DIP (MANUAL ENTRY) - Abnormal; Notable for the following components:      Result Value   Color, UA other (*)    Clarity, UA turbid (*)    Blood, UA large (*)    Protein Ur, POC >=300 (*)    Nitrite, UA Positive (*)    Leukocytes, UA Large  (3+) (*)    All other components within normal limits  URINE CULTURE  POCT CBC W AUTO DIFF ([facility]):  WBC 8.6; LY 24.7; MO 3.8; GR 71.5; Hgb 13.3; Platelets 222     EKG None  Radiology No results found.  Procedures Procedures (including critical care time)  Medications Ordered in UC Medications - No data to display  Initial Impression / Assessment and Plan / UC Course  I have reviewed the triage vital signs and the nursing notes.  Pertinent labs & imaging results that were available during my care of the patient were reviewed by me and considered in my medical decision making (see chart for details).    Urine culture pending. Begin Keflex 500mg  BID for one week.   Followup with Family Doctor if not improved in about 5 days.   Final Clinical Impressions(s) / UC Diagnoses   Final diagnoses:  Urinary tract infection with hematuria, site unspecified     Discharge Instructions     Increase fluid intake. May use non-prescription AZO for about two days, if desired, to decrease urinary discomfort.  If symptoms become significantly worse during the night or over the weekend, proceed to the local emergency room.     ED Prescriptions    Medication Sig Dispense Auth. Provider   cephALEXin (KEFLEX) 500 MG capsule Take 1 capsule (500 mg total) by mouth 2 (two) times daily. 14 capsule Lattie Haw, MD        Lattie Haw, MD 06/03/18 [phone]

## 2018-06-01 LAB — URINE CULTURE
MICRO NUMBER: 91353247
SPECIMEN QUALITY:: ADEQUATE

## 2018-06-03 ENCOUNTER — Telehealth: Payer: Self-pay | Admitting: *Deleted

## 2018-06-03 NOTE — Telephone Encounter (Signed)
Spoke to pt given Ucx results. She reports that she is feeling much better. Advised her to complete ABT and f/u with PCP if she fails to improve.

## 2018-06-12 ENCOUNTER — Encounter (HOSPITAL_BASED_OUTPATIENT_CLINIC_OR_DEPARTMENT_OTHER): Payer: Self-pay | Admitting: *Deleted

## 2018-06-12 ENCOUNTER — Other Ambulatory Visit: Payer: Self-pay

## 2018-06-15 ENCOUNTER — Other Ambulatory Visit: Payer: Self-pay | Admitting: Physician Assistant

## 2018-06-16 ENCOUNTER — Other Ambulatory Visit: Payer: Self-pay

## 2018-06-16 ENCOUNTER — Encounter (HOSPITAL_BASED_OUTPATIENT_CLINIC_OR_DEPARTMENT_OTHER)
Admission: RE | Admit: 2018-06-16 | Discharge: 2018-06-16 | Disposition: A | Discharge status: Home / Self Care | Payer: BLUE CROSS/BLUE SHIELD | Source: Ambulatory Visit | Attending: Plastic Surgery | Admitting: Plastic Surgery

## 2018-06-16 DIAGNOSIS — I499 Cardiac arrhythmia, unspecified: Secondary | ICD-10-CM | POA: Insufficient documentation

## 2018-06-16 DIAGNOSIS — I10 Essential (primary) hypertension: Secondary | ICD-10-CM | POA: Diagnosis present

## 2018-06-16 LAB — CBC WITH DIFFERENTIAL/PLATELET
ABS IMMATURE GRANULOCYTES: 0.02 10*3/uL (ref 0.00–0.07)
Basophils Absolute: 0 10*3/uL (ref 0.0–0.1)
Basophils Relative: 1 %
EOS PCT: 2 %
Eosinophils Absolute: 0.1 10*3/uL (ref 0.0–0.5)
HEMATOCRIT: 42.8 % (ref 36.0–46.0)
HEMOGLOBIN: 13.7 g/dL (ref 12.0–15.0)
Immature Granulocytes: 0 %
LYMPHS ABS: 2.1 10*3/uL (ref 0.7–4.0)
LYMPHS PCT: 33 %
MCH: 29.1 pg (ref 26.0–34.0)
MCHC: 32 g/dL (ref 30.0–36.0)
MCV: 90.9 fL (ref 80.0–100.0)
MONO ABS: 0.5 10*3/uL (ref 0.1–1.0)
MONOS PCT: 7 %
NEUTROS ABS: 3.7 10*3/uL (ref 1.7–7.7)
Neutrophils Relative %: 57 %
Platelets: 227 10*3/uL (ref 150–400)
RBC: 4.71 MIL/uL (ref 3.87–5.11)
RDW: 11.9 % (ref 11.5–15.5)
WBC: 6.5 10*3/uL (ref 4.0–10.5)
nRBC: 0 % (ref 0.0–0.2)

## 2018-06-16 LAB — BASIC METABOLIC PANEL
Anion gap: 7 (ref 5–15)
BUN: 15 mg/dL (ref 8–23)
CHLORIDE: 107 mmol/L (ref 98–111)
CO2: 28 mmol/L (ref 22–32)
CREATININE: 0.88 mg/dL (ref 0.44–1.00)
Calcium: 9.4 mg/dL (ref 8.9–10.3)
GFR calc Af Amer: >60 mL/min (ref >60)
GFR calc non Af Amer: >60 mL/min (ref >60)
Glucose, Bld: 98 mg/dL (ref 70–99)
POTASSIUM: 4.2 mmol/L (ref 3.5–5.1)
SODIUM: 142 mmol/L (ref 135–145)

## 2018-06-16 NOTE — Progress Notes (Signed)
Ensure pre surgery drink given with instructions to complete by 0410 dos, pt verbalized understanding. 

## 2018-06-17 NOTE — H&P (Signed)
Subjective:     Patient ID: Sharon EngelsMelissa Paige Hoover is a 62 y.o. female.  HPI  Referred by Verna CzechJ Breeback PA-C for consultation breast reduction. Current 44 DD down from 46 DDD following gastric sleeve. Though weight is decreasing her mid back pain is increasing. She feels like now that her breast are sagging that it is creating more back pain. She has had chronic back pain for years and has tried PT, specialty bras, heat/cold, T#3 though now on Tylenol alone as T#3 gave constipation post bypass. Unable to take NSAIDs post bypass.  Denies rashes. +Numbness hands. On gabapentin for back pain and neuropathy. Goal is "B or large A." Pt states she did well with tramadol for pain control after bypass and had no complications with surgery.  Underwent sleeve gastrectomy 06/2017. Highest weight 273 lb. Stable at current weight for over 3 months, goal would be less than 200 and "really happy" at 175 lb. Goal for her is to be able to jog and hike and she is not there yet. [place] active [facility] [employer], able to get rid of her scooter.   Last MMG 02/19/2018 with normal results. Mother with breast ca and underwent MRM. MGM with unknown cancer.  PMH includes bipolar- [facility], feels this is well controlled.  Also has OSA on CPAP though CPAP broken and not able to afford to fix currently.  Patient has not smoked in 30 years.  Works are Veterinary surgeonrealtor. Lives with 4 children - all high school or older.   States daughter is going to drive her to surgery and stay with her.  Review of Systems  Constitutional: Positive for fatigue. Negative for chills and fever.  Musculoskeletal: Positive for arthralgias, back pain, joint swelling, myalgias and neck pain.  Skin: Negative for color change, pallor, rash and wound.  Allergic/Immunologic: Positive for environmental allergies and food allergies.  Psychiatric/Behavioral: Negative for sleep disturbance.   Remainder 12 point review negative    Objective:   Physical Exam    Cardiovascular: Normal rate, regular rhythm and normal heart sounds.  Pulmonary/Chest: Effort normal and breath sounds normal. No respiratory distress. She has no wheezes. She has no rales.  Abdominal: Soft.  Lymphadenopathy:    She has no axillary adenopathy.   + shoulder grooving Grade 3 ptosis No masses SN to nipple R 34 L 35 cm BW R 22 L 22 cm Nipple to IMF R 14 L 13 cm   Assessment:     Macromastia  Chronic neck and back pain    Plan:  Reviewed reduction with anchor type scars, possible overnight hospital stay or drains, post operative visits and limitations, recovery. Diminished sensation nipple and breast skin, risk of nipple loss, wound healing problems, asymmetry, incidental carcinoma, changes with wt gain/loss, aging, unacceptable cosmetic appearance reviewed. Cannot assure her cup size.   Anticipate 652 g reduction from each breast.   All risks of the surgery were discussed with the patient and all of patients questions were answered. Patient signed consent to have bilateral breast reduction performed on 06/24/18.   Patient given printed prescription for tramadol 50mg .   Glenna FellowsBrinda Loany Neuroth, MD [facility] Pl[facility] [phone], pin [phone]

## 2018-06-18 ENCOUNTER — Other Ambulatory Visit: Payer: Self-pay | Admitting: Physician Assistant

## 2018-06-24 ENCOUNTER — Ambulatory Visit (HOSPITAL_BASED_OUTPATIENT_CLINIC_OR_DEPARTMENT_OTHER)
Admission: RE | Admit: 2018-06-24 | Discharge: 2018-06-24 | Disposition: A | Discharge status: Home / Self Care | Payer: BLUE CROSS/BLUE SHIELD | Source: Ambulatory Visit | Attending: Plastic Surgery | Admitting: Plastic Surgery

## 2018-06-24 ENCOUNTER — Ambulatory Visit (HOSPITAL_BASED_OUTPATIENT_CLINIC_OR_DEPARTMENT_OTHER): Payer: BLUE CROSS/BLUE SHIELD | Admitting: Anesthesiology

## 2018-06-24 ENCOUNTER — Encounter (HOSPITAL_BASED_OUTPATIENT_CLINIC_OR_DEPARTMENT_OTHER): Payer: Self-pay | Admitting: Anesthesiology

## 2018-06-24 ENCOUNTER — Other Ambulatory Visit: Payer: Self-pay

## 2018-06-24 ENCOUNTER — Encounter (HOSPITAL_BASED_OUTPATIENT_CLINIC_OR_DEPARTMENT_OTHER)
Admission: RE | Disposition: A | Discharge status: Home / Self Care | Payer: Self-pay | Source: Ambulatory Visit | Attending: Plastic Surgery

## 2018-06-24 DIAGNOSIS — M542 Cervicalgia: Secondary | ICD-10-CM | POA: Diagnosis not present

## 2018-06-24 DIAGNOSIS — N62 Hypertrophy of breast: Secondary | ICD-10-CM | POA: Insufficient documentation

## 2018-06-24 DIAGNOSIS — Z803 Family history of malignant neoplasm of breast: Secondary | ICD-10-CM | POA: Insufficient documentation

## 2018-06-24 DIAGNOSIS — M546 Pain in thoracic spine: Secondary | ICD-10-CM | POA: Insufficient documentation

## 2018-06-24 DIAGNOSIS — G629 Polyneuropathy, unspecified: Secondary | ICD-10-CM | POA: Insufficient documentation

## 2018-06-24 DIAGNOSIS — G8929 Other chronic pain: Secondary | ICD-10-CM | POA: Insufficient documentation

## 2018-06-24 DIAGNOSIS — G4733 Obstructive sleep apnea (adult) (pediatric): Secondary | ICD-10-CM | POA: Insufficient documentation

## 2018-06-24 HISTORY — DX: Sleep apnea, unspecified: G47.30

## 2018-06-24 HISTORY — PX: BREAST REDUCTION SURGERY: SHX8

## 2018-06-24 SURGERY — MAMMOPLASTY, REDUCTION
Anesthesia: General | Site: Breast | Laterality: Bilateral

## 2018-06-24 MED ORDER — PROPOFOL 10 MG/ML IV BOLUS
INTRAVENOUS | Status: DC | PRN
  Administered 2018-06-24: 150 mg via INTRAVENOUS

## 2018-06-24 MED ORDER — CHLORHEXIDINE GLUCONATE CLOTH 2 % EX PADS: 6.0000 | MEDICATED_PAD | Freq: Once | CUTANEOUS | Status: DC

## 2018-06-24 MED ORDER — CELECOXIB 200 MG PO CAPS
200.0000 mg | ORAL_CAPSULE | ORAL | Status: AC
  Administered 2018-06-24: 200 mg via ORAL

## 2018-06-24 MED ORDER — OXYCODONE HCL 5 MG PO TABS: 5.0000 mg | ORAL_TABLET | Freq: Once | ORAL | Status: DC | PRN

## 2018-06-24 MED ORDER — BUPIVACAINE LIPOSOME 1.3 % IJ SUSP
INTRAMUSCULAR | Status: AC
  Filled 2018-06-24: qty 20

## 2018-06-24 MED ORDER — MEPERIDINE HCL 25 MG/ML IJ SOLN: 6.2500 mg | INTRAMUSCULAR | Status: DC | PRN

## 2018-06-24 MED ORDER — FENTANYL CITRATE (PF) 100 MCG/2ML IJ SOLN
50.0000 ug | INTRAMUSCULAR | Status: AC | PRN
  Administered 2018-06-24: 50 ug via INTRAVENOUS
  Administered 2018-06-24: 100 ug via INTRAVENOUS
  Administered 2018-06-24: 50 ug via INTRAVENOUS

## 2018-06-24 MED ORDER — GABAPENTIN 300 MG PO CAPS
300.0000 mg | ORAL_CAPSULE | ORAL | Status: AC
  Administered 2018-06-24: 300 mg via ORAL

## 2018-06-24 MED ORDER — FENTANYL CITRATE (PF) 100 MCG/2ML IJ SOLN
INTRAMUSCULAR | Status: AC
  Filled 2018-06-24: qty 2

## 2018-06-24 MED ORDER — EPHEDRINE SULFATE 50 MG/ML IJ SOLN
INTRAMUSCULAR | Status: DC | PRN
  Administered 2018-06-24 (×4): 10 mg via INTRAVENOUS

## 2018-06-24 MED ORDER — 0.9 % SODIUM CHLORIDE (POUR BTL) OPTIME
TOPICAL | Status: DC | PRN
  Administered 2018-06-24: 1000 mL

## 2018-06-24 MED ORDER — HEPARIN SODIUM (PORCINE) 5000 UNIT/ML IJ SOLN
5000.0000 [IU] | Freq: Once | INTRAMUSCULAR | Status: AC
  Administered 2018-06-24: 5000 [IU] via SUBCUTANEOUS

## 2018-06-24 MED ORDER — GABAPENTIN 300 MG PO CAPS
300.0000 mg | ORAL_CAPSULE | Freq: Once | ORAL | Status: AC
  Administered 2018-06-24: 300 mg via ORAL

## 2018-06-24 MED ORDER — HYDROMORPHONE HCL 1 MG/ML IJ SOLN
0.2500 mg | INTRAMUSCULAR | Status: DC | PRN
  Administered 2018-06-24: 0.5 mg via INTRAVENOUS

## 2018-06-24 MED ORDER — DEXAMETHASONE SODIUM PHOSPHATE 10 MG/ML IJ SOLN
INTRAMUSCULAR | Status: AC
  Filled 2018-06-24: qty 1

## 2018-06-24 MED ORDER — SCOPOLAMINE 1 MG/3DAYS TD PT72: 1.0000 | MEDICATED_PATCH | Freq: Once | TRANSDERMAL | Status: DC | PRN

## 2018-06-24 MED ORDER — SUGAMMADEX SODIUM 200 MG/2ML IV SOLN
INTRAVENOUS | Status: DC | PRN
  Administered 2018-06-24: 200 mg via INTRAVENOUS

## 2018-06-24 MED ORDER — SODIUM CHLORIDE (PF) 0.9 % IJ SOLN
INTRAMUSCULAR | Status: AC
  Filled 2018-06-24: qty 20

## 2018-06-24 MED ORDER — HEPARIN SODIUM (PORCINE) 5000 UNIT/ML IJ SOLN
INTRAMUSCULAR | Status: AC
  Filled 2018-06-24: qty 1

## 2018-06-24 MED ORDER — CEFAZOLIN SODIUM-DEXTROSE 2-4 GM/100ML-% IV SOLN
2.0000 g | INTRAVENOUS | Status: AC
  Administered 2018-06-24: 2 g via INTRAVENOUS

## 2018-06-24 MED ORDER — PROMETHAZINE HCL 25 MG/ML IJ SOLN: 6.2500 mg | INTRAMUSCULAR | Status: DC | PRN

## 2018-06-24 MED ORDER — MIDAZOLAM HCL 2 MG/2ML IJ SOLN
INTRAMUSCULAR | Status: AC
  Filled 2018-06-24: qty 2

## 2018-06-24 MED ORDER — GABAPENTIN 300 MG PO CAPS
ORAL_CAPSULE | ORAL | Status: AC
  Filled 2018-06-24: qty 1

## 2018-06-24 MED ORDER — OXYCODONE HCL 5 MG/5ML PO SOLN: 5.0000 mg | Freq: Once | ORAL | Status: DC | PRN

## 2018-06-24 MED ORDER — LACTATED RINGERS IV SOLN
INTRAVENOUS | Status: DC
  Administered 2018-06-24 (×3): via INTRAVENOUS

## 2018-06-24 MED ORDER — CELECOXIB 200 MG PO CAPS
ORAL_CAPSULE | ORAL | Status: AC
  Filled 2018-06-24: qty 1

## 2018-06-24 MED ORDER — ROCURONIUM BROMIDE 100 MG/10ML IV SOLN
INTRAVENOUS | Status: DC | PRN
  Administered 2018-06-24: 20 mg via INTRAVENOUS
  Administered 2018-06-24 (×2): 10 mg via INTRAVENOUS
  Administered 2018-06-24: 50 mg via INTRAVENOUS

## 2018-06-24 MED ORDER — ACETAMINOPHEN 500 MG PO TABS
ORAL_TABLET | ORAL | Status: AC
  Filled 2018-06-24: qty 1

## 2018-06-24 MED ORDER — HYDROMORPHONE HCL 1 MG/ML IJ SOLN
INTRAMUSCULAR | Status: AC
  Filled 2018-06-24: qty 0.5

## 2018-06-24 MED ORDER — CEFAZOLIN SODIUM-DEXTROSE 2-4 GM/100ML-% IV SOLN
INTRAVENOUS | Status: AC
  Filled 2018-06-24: qty 100

## 2018-06-24 MED ORDER — LIDOCAINE 2% (20 MG/ML) 5 ML SYRINGE
INTRAMUSCULAR | Status: AC
  Filled 2018-06-24: qty 5

## 2018-06-24 MED ORDER — ACETAMINOPHEN 500 MG PO TABS
1000.0000 mg | ORAL_TABLET | ORAL | Status: AC
  Administered 2018-06-24: 1000 mg via ORAL

## 2018-06-24 MED ORDER — PROPOFOL 500 MG/50ML IV EMUL
INTRAVENOUS | Status: AC
  Filled 2018-06-24: qty 50

## 2018-06-24 MED ORDER — ROCURONIUM BROMIDE 50 MG/5ML IV SOSY
PREFILLED_SYRINGE | INTRAVENOUS | Status: AC
  Filled 2018-06-24: qty 5

## 2018-06-24 MED ORDER — SODIUM CHLORIDE 0.9 % IV SOLN
INTRAVENOUS | Status: DC | PRN
  Administered 2018-06-24: 40 mL

## 2018-06-24 MED ORDER — MIDAZOLAM HCL 2 MG/2ML IJ SOLN
1.0000 mg | INTRAMUSCULAR | Status: DC | PRN
  Administered 2018-06-24: 2 mg via INTRAVENOUS

## 2018-06-24 MED ORDER — ONDANSETRON HCL 4 MG/2ML IJ SOLN
INTRAMUSCULAR | Status: DC | PRN
  Administered 2018-06-24: 4 mg via INTRAVENOUS

## 2018-06-24 MED ORDER — ONDANSETRON HCL 4 MG/2ML IJ SOLN
INTRAMUSCULAR | Status: AC
  Filled 2018-06-24: qty 2

## 2018-06-24 MED ORDER — SODIUM CHLORIDE 0.9 % IN NEBU
INHALATION_SOLUTION | RESPIRATORY_TRACT | Status: AC
  Filled 2018-06-24: qty 3

## 2018-06-24 MED ORDER — PHENYLEPHRINE HCL 10 MG/ML IJ SOLN
INTRAMUSCULAR | Status: DC | PRN
  Administered 2018-06-24 (×2): 80 ug via INTRAVENOUS

## 2018-06-24 MED ORDER — DEXAMETHASONE SODIUM PHOSPHATE 4 MG/ML IJ SOLN
INTRAMUSCULAR | Status: DC | PRN
  Administered 2018-06-24: 10 mg via INTRAVENOUS

## 2018-06-24 SURGICAL SUPPLY — 55 items
APPLIER CLIP 9.375 MED OPEN (MISCELLANEOUS)
BINDER BREAST 3XL (GAUZE/BANDAGES/DRESSINGS) IMPLANT
BINDER BREAST LRG (GAUZE/BANDAGES/DRESSINGS) IMPLANT
BINDER BREAST MEDIUM (GAUZE/BANDAGES/DRESSINGS) IMPLANT
BINDER BREAST XLRG (GAUZE/BANDAGES/DRESSINGS) ×2 IMPLANT
BINDER BREAST XXLRG (GAUZE/BANDAGES/DRESSINGS) IMPLANT
BLADE SURG 10 STRL SS (BLADE) ×8 IMPLANT
BNDG GAUZE ELAST 4 BULKY (GAUZE/BANDAGES/DRESSINGS) ×4 IMPLANT
CANISTER SUCT 1200ML W/VALVE (MISCELLANEOUS) ×2 IMPLANT
CHLORAPREP W/TINT 26ML (MISCELLANEOUS) ×4 IMPLANT
CLIP APPLIE 9.375 MED OPEN (MISCELLANEOUS) IMPLANT
CLIP VESOCCLUDE MED 6/CT (CLIP) IMPLANT
COVER BACK TABLE 60X90IN (DRAPES) ×2 IMPLANT
COVER MAYO STAND STRL (DRAPES) ×2 IMPLANT
COVER WAND RF STERILE (DRAPES) IMPLANT
DERMABOND ADVANCED (GAUZE/BANDAGES/DRESSINGS) ×4
DERMABOND ADVANCED .7 DNX12 (GAUZE/BANDAGES/DRESSINGS) ×4 IMPLANT
DRAIN CHANNEL 15F RND FF W/TCR (WOUND CARE) ×4 IMPLANT
DRAPE TOP ARMCOVERS (MISCELLANEOUS) ×2 IMPLANT
DRAPE U-SHAPE 76X120 STRL (DRAPES) ×2 IMPLANT
DRSG PAD ABDOMINAL 8X10 ST (GAUZE/BANDAGES/DRESSINGS) ×4 IMPLANT
ELECT COATED BLADE 2.86 ST (ELECTRODE) ×2 IMPLANT
ELECT REM PT RETURN 9FT ADLT (ELECTROSURGICAL) ×2
ELECTRODE REM PT RTRN 9FT ADLT (ELECTROSURGICAL) ×1 IMPLANT
EVACUATOR SILICONE 100CC (DRAIN) ×4 IMPLANT
GLOVE BIO SURGEON STRL SZ 6 (GLOVE) ×4 IMPLANT
GLOVE BIO SURGEON STRL SZ7 (GLOVE) ×2 IMPLANT
GLOVE BIOGEL PI IND STRL 7.5 (GLOVE) ×2 IMPLANT
GLOVE BIOGEL PI INDICATOR 7.5 (GLOVE) ×2
GOWN STRL REUS W/ TWL LRG LVL3 (GOWN DISPOSABLE) ×2 IMPLANT
GOWN STRL REUS W/TWL LRG LVL3 (GOWN DISPOSABLE) ×2
MARKER SKIN DUAL TIP RULER LAB (MISCELLANEOUS) IMPLANT
NEEDLE HYPO 25X1 1.5 SAFETY (NEEDLE) ×2 IMPLANT
NS IRRIG 1000ML POUR BTL (IV SOLUTION) ×2 IMPLANT
PACK BASIN DAY SURGERY FS (CUSTOM PROCEDURE TRAY) ×2 IMPLANT
PENCIL BUTTON HOLSTER BLD 10FT (ELECTRODE) ×2 IMPLANT
PIN SAFETY STERILE (MISCELLANEOUS) ×2 IMPLANT
SHEET MEDIUM DRAPE 40X70 STRL (DRAPES) ×2 IMPLANT
SLEEVE SCD COMPRESS KNEE MED (MISCELLANEOUS) ×2 IMPLANT
SPONGE LAP 18X18 RF (DISPOSABLE) ×4 IMPLANT
STAPLER VISISTAT 35W (STAPLE) ×4 IMPLANT
SUT ETHILON 2 0 FS 18 (SUTURE) ×4 IMPLANT
SUT MNCRL AB 4-0 PS2 18 (SUTURE) ×8 IMPLANT
SUT PDS AB 2-0 CT2 27 (SUTURE) IMPLANT
SUT VIC AB 3-0 PS1 18 (SUTURE) ×6
SUT VIC AB 3-0 PS1 18XBRD (SUTURE) ×6 IMPLANT
SUT VICRYL 4-0 PS2 18IN ABS (SUTURE) ×4 IMPLANT
SYR BULB IRRIGATION 50ML (SYRINGE) ×2 IMPLANT
SYR CONTROL 10ML LL (SYRINGE) ×2 IMPLANT
TAPE MEASURE VINYL STERILE (MISCELLANEOUS) IMPLANT
TOWEL GREEN STERILE FF (TOWEL DISPOSABLE) ×4 IMPLANT
TRAY FOLEY W/BAG SLVR 14FR LF (SET/KITS/TRAYS/PACK) IMPLANT
TUBE CONNECTING 20X1/4 (TUBING) ×2 IMPLANT
UNDERPAD 30X30 (UNDERPADS AND DIAPERS) ×4 IMPLANT
YANKAUER SUCT BULB TIP NO VENT (SUCTIONS) ×2 IMPLANT

## 2018-06-24 NOTE — Op Note (Signed)
Operative Note   DATE OF OPERATION: 12.3.2019  LOCATION: [facility]-outpatient  SURGICAL DIVISION: Plastic Surgery  PREOPERATIVE DIAGNOSES:  1. Macromastia 2. Chronic neck and back pain  POSTOPERATIVE DIAGNOSES:  same  PROCEDURE:  Bilateral breast reduction  SURGEON: Glenna FellowsBrinda Kayveon Lennartz MD MBA  ASSISTANT: none  ANESTHESIA:  General.   EBL: 125 ml  COMPLICATIONS: None immediate.   INDICATIONS FOR PROCEDURE:  The patient, Sharon Hoover, is a 62 y.o. female born on 1956-04-23, is here for breast reduction for treatment chronic neck and back pain in setting macromastia unrelieved by conservative measures.   FINDINGS: Right reduction524 g Left 501 g  DESCRIPTION OF PROCEDURE:  The patient was marked standing in the preoperative area to mark sternal notch, chest midline, anterior axillary lines, inframammary folds. The location of new nipple areolar complex was marked at level of on inframammary fold on anterior surface breast by palpation. This was marked symmetric over bilateral breasts. With aid of Wise pattern marker, location of new nipple areolar complex and vertical limbs (8cm) were markedby displacement of breasts along meridian.The patient was taken to the operating room. SCDs were placedand IV antibiotics were given. The patient's operative site was prepped and draped in a sterile fashion. A time out was performed and all information was confirmed to be correct.   Over left breast, superomedial pedicle marked and nipple areolar complex marked with4242mm diameter marker. Pedicle deepithlialized and developedto chest wall. Breast tissue resected over lower pole. Medial and lateral flaps developed.Additional superior pole and lateral chest wall tissue excised.Breast tailor tacked closed.  I then directed attention to right breast where superomedial pedicle designed. The pedicle was deepithelialized. Pedicle developed until tension free rotation was possible.Breast  tissue resected over lower pole. Medial and lateral flaps developed.Additional superior pole and lateral chest wall tissue excised. Breast tailor tacked closed, and patient brought to upright sitting position and assessed for symmetry. Patent returned to supine position and breast cavities irrigated and hemostasis obtained. Exparel infiltrated throughout each breast. 15 Fr JP placed in each breast and secured with 2-0 nylon. Closure completed bilateralwith 3-0 vicryl to approximate dermis along inframammary fold and vertical limb. NAC inset with 4-0 vicryl in dermis. Skin closure completed with 4-0 monocryl subcuticular throughout.Tissue adhesive applied. Dry dressing and breast binder applied.  The patient was allowed to wake from anesthesia, extubated and taken to the recovery room in satisfactory condition.   SPECIMENS: right and left breast reduction  DRAINS: 15 Fr JP in right and left breast  Glenna FellowsBrinda Jerika Wales, MD [facility] Pl[facility] & Reconstructive Surgery [phone], pin [phone]

## 2018-06-24 NOTE — Transfer of Care (Signed)
Immediate Anesthesia Transfer of Care Note  Patient: Sharon Hoover  Procedure(s) Performed: MAMMARY REDUCTION  (BREAST) (Bilateral Breast)  Patient Location: PACU  Anesthesia Type:General  Level of Consciousness: sedated  Airway & Oxygen Therapy: Patient Spontanous Breathing and Patient connected to face mask oxygen  Post-op Assessment: Report given to RN and Post -op Vital signs reviewed and stable  Post vital signs: Reviewed and stable  Last Vitals:  Vitals Value Taken Time  BP    Temp    Pulse 78 06/24/2018 11:20 AM  Resp 14 06/24/2018 11:20 AM  SpO2 99 % 06/24/2018 11:20 AM  Vitals shown include unvalidated device data.  Last Pain:  Vitals:   06/24/18 0659  TempSrc: Oral         Complications: No apparent anesthesia complications

## 2018-06-24 NOTE — Anesthesia Procedure Notes (Signed)
Procedure Name: Intubation Date/Time: 06/24/2018 7:40 AM Performed by: Maryella Shivers, CRNA Pre-anesthesia Checklist: Patient identified, Emergency Drugs available, Suction available and Patient being monitored Patient Re-evaluated:Patient Re-evaluated prior to induction Oxygen Delivery Method: Circle system utilized Preoxygenation: Pre-oxygenation with 100% oxygen Induction Type: IV induction Ventilation: Mask ventilation without difficulty Laryngoscope Size: Mac and 3 Grade View: Grade I Tube type: Oral Tube size: 7.0 mm Number of attempts: 1 Airway Equipment and Method: Stylet and Oral airway Placement Confirmation: ETT inserted through vocal cords under direct vision,  positive ETCO2 and breath sounds checked- equal and bilateral Secured at: 20 cm Tube secured with: Tape Dental Injury: Teeth and Oropharynx as per pre-operative assessment

## 2018-06-24 NOTE — Anesthesia Preprocedure Evaluation (Addendum)
Anesthesia Evaluation  Patient identified by MRN, date of birth, ID band Patient awake    Reviewed: Allergy & Precautions, NPO status , Patient's Chart, lab work & pertinent test results  Airway Mallampati: II  TM Distance: >3 FB Neck ROM: Full    Dental no notable dental hx.    Pulmonary sleep apnea , former smoker,    Pulmonary exam normal breath sounds clear to auscultation       Cardiovascular hypertension, Pt. on medications Normal cardiovascular exam Rhythm:Regular Rate:Normal     Neuro/Psych Bipolar Disorder negative neurological ROS     GI/Hepatic negative GI ROS, Neg liver ROS,   Endo/Other  negative endocrine ROS  Renal/GU negative Renal ROS  negative genitourinary   Musculoskeletal negative musculoskeletal ROS (+)   Abdominal   Peds negative pediatric ROS (+)  Hematology negative hematology ROS (+)   Anesthesia Other Findings   Reproductive/Obstetrics negative OB ROS                            Anesthesia Physical Anesthesia Plan  ASA: II  Anesthesia Plan: General   Post-op Pain Management:    Induction: Intravenous  PONV Risk Score and Plan: 3 and Ondansetron, Dexamethasone, Treatment may vary due to age or medical condition and Midazolam  Airway Management Planned: Oral ETT  Additional Equipment:   Intra-op Plan:   Post-operative Plan: Extubation in OR  Informed Consent: I have reviewed the patients History and Physical, chart, labs and discussed the procedure including the risks, benefits and alternatives for the proposed anesthesia with the patient or authorized representative who has indicated his/her understanding and acceptance.   Dental advisory given  Plan Discussed with: CRNA  Anesthesia Plan Comments:         Anesthesia Quick Evaluation

## 2018-06-24 NOTE — Interval H&P Note (Signed)
History and Physical Interval Note:  06/24/2018 6:50 AM  Sharon Hoover  has presented today for surgery, with the diagnosis of macromastia chronic neck and back pain  The various methods of treatment have been discussed with the patient and family. After consideration of risks, benefits and other options for treatment, the patient has consented to  Procedure(s): MAMMARY REDUCTION  (BREAST) (Bilateral) as a surgical intervention .  The patient's history has been reviewed, patient examined, no change in status, stable for surgery.  I have reviewed the patient's chart and labs.  Questions were answered to the patient's satisfaction.     Irean HongBrinda Mabeline Varas

## 2018-06-24 NOTE — Discharge Instructions (Signed)
°Post Anesthesia Home Care Instructions ° °Activity: °Get plenty of rest for the remainder of the day. A responsible individual must stay with you for 24 hours following the procedure.  °For the next 24 hours, DO NOT: °-Drive a car °-Operate machinery °-Drink alcoholic beverages °-Take any medication unless instructed by your physician °-Make any legal decisions or sign important papers. ° °Meals: °Start with liquid foods such as gelatin or soup. Progress to regular foods as tolerated. Avoid greasy, spicy, heavy foods. If nausea and/or vomiting occur, drink only clear liquids until the nausea and/or vomiting subsides. Call your physician if vomiting continues. ° °Special Instructions/Symptoms: °Your throat may feel dry or sore from the anesthesia or the breathing tube placed in your throat during surgery. If this causes discomfort, gargle with warm salt water. The discomfort should disappear within 24 hours. ° °If you had a scopolamine patch placed behind your ear for the management of post- operative nausea and/or vomiting: ° °1. The medication in the patch is effective for 72 hours, after which it should be removed.  Wrap patch in a tissue and discard in the trash. Wash hands thoroughly with soap and water. °2. You may remove the patch earlier than 72 hours if you experience unpleasant side effects which may include dry mouth, dizziness or visual disturbances. °3. Avoid touching the patch. Wash your hands with soap and water after contact with the patch. °   °Information for Discharge Teaching: °EXPAREL (bupivacaine liposome injectable suspension)  ° °Your surgeon or anesthesiologist gave you EXPAREL(bupivacaine) to help control your pain after surgery.  °· EXPAREL is a local anesthetic that provides pain relief by numbing the tissue around the surgical site. °· EXPAREL is designed to release pain medication over time and can control pain for up to 72 hours. °· Depending on how you respond to EXPAREL, you may  require less pain medication during your recovery. ° °Possible side effects: °· Temporary loss of sensation or ability to move in the area where bupivacaine was injected. °· Nausea, vomiting, constipation °· Rarely, numbness and tingling in your mouth or lips, lightheadedness, or anxiety may occur. °· Call your doctor right away if you think you may be experiencing any of these sensations, or if you have other questions regarding possible side effects. ° °Follow all other discharge instructions given to you by your surgeon or nurse. Eat a healthy diet and drink plenty of water or other fluids. ° °If you return to the hospital for any reason within 96 hours following the administration of EXPAREL, it is important for health care providers to know that you have received this anesthetic. A teal colored band has been placed on your arm with the date, time and amount of EXPAREL you have received in order to alert and inform your health care providers. Please leave this armband in place for the full 96 hours following administration, and then you may remove the band. ° °About my Jackson-Pratt Bulb Drain ° °What is a Jackson-Pratt bulb? °A Jackson-Pratt is a soft, round device used to collect drainage. It is connected to a long, thin drainage catheter, which is held in place by one or two small stiches near your surgical incision site. When the bulb is squeezed, it forms a vacuum, forcing the drainage to empty into the bulb. ° °Emptying the Jackson-Pratt bulb- °To empty the bulb: °1. Release the plug on the top of the bulb. °2. Pour the bulb's contents into a measuring container which your nurse will provide. °  3. Record the time emptied and amount of drainage. Empty the drain(s) as often as your     doctor or nurse recommends. ° °Date                  Time                    Amount (Drain 1)                 Amount (Drain  2) ° °_____________________________________________________________________ ° °_____________________________________________________________________ ° °_____________________________________________________________________ ° °_____________________________________________________________________ ° °_____________________________________________________________________ ° °_____________________________________________________________________ ° °_____________________________________________________________________ ° °_____________________________________________________________________ ° °Squeezing the Jackson-Pratt Bulb- °To squeeze the bulb: °1. Make sure the plug at the top of the bulb is open. °2. Squeeze the bulb tightly in your fist. You will hear air squeezing from the bulb. °3. Replace the plug while the bulb is squeezed. °4. Use a safety pin to attach the bulb to your clothing. This will keep the catheter from     pulling at the bulb insertion site. ° °When to call your doctor- °Call your doctor if: °· Drain site becomes red, swollen or hot. °· You have a fever greater than 101 degrees F. °· There is oozing at the drain site. °· Drain falls out (apply a guaze bandage over the drain hole and secure it with tape). °· Drainage increases daily not related to activity patterns. (You will usually have more drainage when you are active than when you are resting.) °· Drainage has a bad odor. ° ° °

## 2018-06-25 ENCOUNTER — Encounter (HOSPITAL_BASED_OUTPATIENT_CLINIC_OR_DEPARTMENT_OTHER): Payer: Self-pay | Admitting: Plastic Surgery

## 2018-06-25 NOTE — Anesthesia Postprocedure Evaluation (Signed)
Anesthesia Post Note  Patient: Sharon Hoover  Procedure(s) Performed: MAMMARY REDUCTION  (BREAST) (Bilateral Breast)     Patient location during evaluation: PACU Anesthesia Type: General Level of consciousness: awake and alert Pain management: pain level controlled Vital Signs Assessment: post-procedure vital signs reviewed and stable Respiratory status: spontaneous breathing, nonlabored ventilation, respiratory function stable and patient connected to nasal cannula oxygen Cardiovascular status: blood pressure returned to baseline and stable Postop Assessment: no apparent nausea or vomiting Anesthetic complications: no    Last Vitals:  Vitals:   06/24/18 1224 06/24/18 1330  BP: (!) 142/68   Pulse: 93 90  Resp: 16 16  Temp: 36.5 C   SpO2: 96% 97%    Last Pain:  Vitals:   06/24/18 1330  TempSrc:   PainSc: 2                  Phillips Groutarignan, Welborn Keena

## 2018-08-13 ENCOUNTER — Other Ambulatory Visit: Payer: Self-pay

## 2018-08-13 MED ORDER — FLUCONAZOLE 150 MG PO TABS: 150.0000 mg | ORAL_TABLET | Freq: Once | ORAL | 0 refills | Status: AC

## 2018-08-13 MED ORDER — ECONAZOLE NITRATE 1 % EX CREA: TOPICAL_CREAM | CUTANEOUS | 1 refills | Status: AC

## 2018-08-13 NOTE — Telephone Encounter (Signed)
Patient called today requesting a prescription for Diflucan. Patient states she gets frequent yeast/fungal infections in the crease below her stomach. Patient also states that she would like a refill on the Econazole nitrate cream as the last prescription expired. Just wanted to make sure new prescription order and refill was appropriate. Thanks!

## 2018-09-15 ENCOUNTER — Other Ambulatory Visit: Payer: Self-pay | Admitting: Physician Assistant

## 2018-09-15 DIAGNOSIS — M5412 Radiculopathy, cervical region: Secondary | ICD-10-CM

## 2018-09-25 ENCOUNTER — Ambulatory Visit (INDEPENDENT_AMBULATORY_CARE_PROVIDER_SITE_OTHER): Payer: BLUE CROSS/BLUE SHIELD | Admitting: Sports Medicine

## 2018-09-25 DIAGNOSIS — M119 Crystal arthropathy, unspecified: Secondary | ICD-10-CM

## 2018-09-25 DIAGNOSIS — N3001 Acute cystitis with hematuria: Secondary | ICD-10-CM

## 2018-09-25 DIAGNOSIS — R300 Dysuria: Secondary | ICD-10-CM

## 2018-09-25 DIAGNOSIS — N3000 Acute cystitis without hematuria: Secondary | ICD-10-CM | POA: Insufficient documentation

## 2018-09-25 LAB — POCT URINALYSIS DIPSTICK
Bilirubin, UA: NEGATIVE
Glucose, UA: NEGATIVE
Ketones, UA: NEGATIVE
Nitrite, UA: POSITIVE
Protein, UA: NEGATIVE
Spec Grav, UA: 1.02 (ref 1.010–1.025)
Urobilinogen, UA: 0.2 U/dL
pH, UA: 5.5 (ref 5.0–8.0)

## 2018-09-25 MED ORDER — CEPHALEXIN 500 MG PO CAPS: 500.0000 mg | ORAL_CAPSULE | Freq: Two times a day (BID) | ORAL | 0 refills | Status: AC

## 2018-09-25 NOTE — Assessment & Plan Note (Signed)
Crystals were noted in the left fourth MCP irrigation/lavage back in October. Persistent pain, injecting it today. She never got her labs done, I did want a lupus panel, rheumatoid panel and uric acid levels.

## 2018-09-25 NOTE — Progress Notes (Signed)
Subjective:    CC: Right hand pain  HPI: This is a pleasant 63 year old female, sometime ago we did a right third MCP joint lavage, crystals were noted on the aspirate but the lab was unable to identify what they were.  I had wanted her to get uric acid levels as well as a rheumatoid work-up, she never got this done.  Now having recurrence of pain, moderate, persistent, localized to the third MCP without radiation.  In addition she has developed urinary urgency, burning.  No flank pain, no fevers or chills.  No GI symptoms, no vaginal discharge.  I reviewed the past medical history, family history, social history, surgical history, and allergies today and no changes were needed.  Please see the problem list section below in epic for further details.  Past Medical History: Past Medical History:  Diagnosis Date  . Bipolar 2 disorder (HCC)    pscyh hospitaliztion 2006  . DDD (degenerative disc disease), lumbar    uses tens unit  . Hernia    abdominal incisional  . Herniated disc   . Hypercholesteremia   . Hyperlipidemia   . Hypertension   . Impaired fasting glucose   . Obesity   . Sleep apnea   . Stress incontinence, female    Past Surgical History: Past Surgical History:  Procedure Laterality Date  . ABDOMINAL HYSTERECTOMY    . BLADDER REPAIR    . BLADDER REPAIR    . BREAST REDUCTION SURGERY Bilateral 06/24/2018   Procedure: MAMMARY REDUCTION  (BREAST);  Surgeon: Glenna Fellows, MD;  Location: [facility];  Service: Plastics;  Laterality: Bilateral;  . CHOLECYSTECTOMY    . LAPAROSCOPIC GASTRIC SLEEVE RESECTION N/A 07/02/2017   Procedure: LAPAROSCOPIC GASTRIC SLEEVE RESECTION, UPPER ENDOSCOPY;  Surgeon: Sheliah Hatch, De Blanch, MD;  Location: [facility];  Service: General;  Laterality: N/A;  . THYROIDECTOMY    . THYROIDECTOMY    . TOTAL ABDOMINAL HYSTERECTOMY W/ BILATERAL SALPINGOOPHORECTOMY  2001   for ovarian cyst   Social History: Social History    Socioeconomic History  . Marital status: Divorced    Spouse name: Not on file  . Number of children: Not on file  . Years of education: Not on file  . Highest education level: Not on file  Occupational History  . Not on file  Social Needs  . Financial resource strain: Not on file  . Food insecurity:    Worry: Not on file    Inability: Not on file  . Transportation needs:    Medical: Not on file    Non-medical: Not on file  Tobacco Use  . Smoking status: Former Smoker    Last attempt to quit: 07/24/1987    Years since quitting: 31.1  . Smokeless tobacco: Never Used  Substance and Sexual Activity  . Alcohol use: No    Frequency: Never    Comment: very rarely  . Drug use: No  . Sexual activity: Yes    Birth control/protection: Surgical  Lifestyle  . Physical activity:    Days per week: Not on file    Minutes per session: Not on file  . Stress: Not on file  Relationships  . Social connections:    Talks on phone: Not on file    Gets together: Not on file    Attends religious service: Not on file    Active member of club or organization: Not on file    Attends meetings of clubs or organizations: Not on file  Relationship status: Not on file  Other Topics Concern  . Not on file  Social History Narrative  . Not on file   Family History: Family History  Problem Relation Age of Onset  . Other Father        AMI  . Hyperlipidemia Father   . Hypertension Father   . Heart attack Father   . Cancer Other 80       breast  . Cancer Mother 21       breast  . Depression Sister    Allergies: Allergies  Allergen Reactions  . Erythromycin Diarrhea and Nausea And Vomiting    REACTION: GI Upset, has taken Zpak with no side effects  . Grapefruit Diet Or [Extra Strength Grapefruit]   . Miconazole Nitrate     burning  . Mobic [Meloxicam]     GI issues.   . Other Nausea And Vomiting    Nuts, mushrooms, pineapples, tomatoes, coconut   . Pravastatin Nausea Only    Medications: See med rec.  Review of Systems: No fevers, chills, night sweats, weight loss, chest pain, or shortness of breath.   Objective:    General: Well Developed, well nourished, and in no acute distress.  Neuro: Alert and oriented x3, extra-ocular muscles intact, sensation grossly intact.  HEENT: Normocephalic, atraumatic, pupils equal round reactive to light, neck supple, no masses, no lymphadenopathy, thyroid nonpalpable.  Skin: Warm and dry, no rashes. Cardiac: Regular rate and rhythm, no murmurs rubs or gallops, no lower extremity edema.  Respiratory: Clear to auscultation bilaterally. Not using accessory muscles, speaking in full sentences. Right hand: Minimally swollen, tender third MCP. Abdomen: Soft, nontender, nondistended, normal bowel sounds, no palpable masses, no guarding, rigidity, rebound tenderness, no costovertebral angle pain.  Urinalysis is positive for blood, leukocytes, nitrates.  Procedure: Real-time Ultrasound Guided injection of the right third MCP Device: GE Logiq E  Verbal informed consent obtained.  Time-out conducted.  Noted no overlying erythema, induration, or other signs of local infection.  Skin prepped in a sterile fashion.  Local anesthesia: Topical Ethyl chloride.  With sterile technique and under real time ultrasound guidance:  1/2 cc Kenalog 40, 1/2cc injected easily Completed without difficulty  Pain immediately resolved suggesting accurate placement of the medication.  Advised to call if fevers/chills, erythema, induration, drainage, or persistent bleeding.  Images permanently stored and available for review in the ultrasound unit.  Impression: Technically successful ultrasound guided injection.  Impression and Recommendations:    Arthropathy, crystal, hand Crystals were noted in the left fourth MCP irrigation/lavage back in October. Persistent pain, injecting it today. She never got her labs done, I did want a lupus panel,  rheumatoid panel and uric acid levels.   Acute cystitis Acute cystitis, awaiting culture. Treating empirically with cephalexin.  ___________________________________________ Ihor Austin. Benjamin Stain, M.D., ABFM., CAQSM. Primary Care and Sports Medicine [facility]  Adjunct Professor of Family Medicine  [employer]

## 2018-09-25 NOTE — Assessment & Plan Note (Signed)
Acute cystitis, awaiting culture. Treating empirically with cephalexin.

## 2018-09-27 LAB — URINE CULTURE
MICRO NUMBER:: 282249
SPECIMEN QUALITY:: ADEQUATE

## 2018-09-29 ENCOUNTER — Other Ambulatory Visit: Payer: Self-pay | Admitting: Sports Medicine

## 2018-09-29 MED ORDER — FLUCONAZOLE 150 MG PO TABS: 150.0000 mg | ORAL_TABLET | Freq: Once | ORAL | 0 refills | Status: AC

## 2018-10-09 ENCOUNTER — Other Ambulatory Visit: Payer: Self-pay | Admitting: Physician Assistant

## 2018-10-09 DIAGNOSIS — M5412 Radiculopathy, cervical region: Secondary | ICD-10-CM

## 2018-12-26 ENCOUNTER — Other Ambulatory Visit: Payer: Self-pay | Admitting: Physician Assistant

## 2018-12-26 DIAGNOSIS — M5412 Radiculopathy, cervical region: Secondary | ICD-10-CM

## 2018-12-26 NOTE — Telephone Encounter (Signed)
Patient states that she changed insurance and had to get a doctor at [facility]. Will try to come back next year she said. No further questions at this time.

## 2018-12-26 NOTE — Telephone Encounter (Signed)
Please call pt and scheduled for follow up for refills

## 2018-12-29 NOTE — Telephone Encounter (Signed)
See Jessica's note

## 2019-03-09 ENCOUNTER — Encounter (HOSPITAL_COMMUNITY): Payer: Self-pay

## 2020-01-28 ENCOUNTER — Encounter (HOSPITAL_COMMUNITY): Payer: Self-pay

## 2020-04-27 ENCOUNTER — Ambulatory Visit: Payer: Self-pay

## 2021-01-26 ENCOUNTER — Encounter (HOSPITAL_COMMUNITY): Payer: Self-pay | Admitting: *Deleted

## 2021-06-09 LAB — COLOGUARD: COLOGUARD: NEGATIVE

## 2022-01-26 ENCOUNTER — Encounter (HOSPITAL_COMMUNITY): Payer: Self-pay | Admitting: *Deleted

## 2022-10-19 ENCOUNTER — Telehealth (HOSPITAL_COMMUNITY): Payer: Self-pay | Admitting: Psychiatry

## 2023-02-05 ENCOUNTER — Encounter (HOSPITAL_COMMUNITY): Payer: Self-pay | Admitting: *Deleted

## 2024-02-07 ENCOUNTER — Encounter (HOSPITAL_COMMUNITY): Payer: Self-pay | Admitting: *Deleted
